# Patient Record
Sex: Female | Born: 1945
Health system: Southern US, Community
[De-identification: ages and names within clinical notes are randomized; demographics above are authoritative.]

## PROBLEM LIST (undated history)

## (undated) DIAGNOSIS — K922 Gastrointestinal hemorrhage, unspecified: Secondary | ICD-10-CM

## (undated) DIAGNOSIS — J40 Bronchitis, not specified as acute or chronic: Secondary | ICD-10-CM

## (undated) DIAGNOSIS — K4090 Unilateral inguinal hernia, without obstruction or gangrene, not specified as recurrent: Secondary | ICD-10-CM

## (undated) DIAGNOSIS — K635 Polyp of colon: Secondary | ICD-10-CM

## (undated) DIAGNOSIS — R911 Solitary pulmonary nodule: Secondary | ICD-10-CM

## (undated) DIAGNOSIS — E785 Hyperlipidemia, unspecified: Secondary | ICD-10-CM

## (undated) DIAGNOSIS — K219 Gastro-esophageal reflux disease without esophagitis: Secondary | ICD-10-CM

## (undated) DIAGNOSIS — Z720 Tobacco use: Secondary | ICD-10-CM

## (undated) DIAGNOSIS — I350 Nonrheumatic aortic (valve) stenosis: Secondary | ICD-10-CM

## (undated) DIAGNOSIS — K5730 Diverticulosis of large intestine without perforation or abscess without bleeding: Secondary | ICD-10-CM

## (undated) DIAGNOSIS — R42 Dizziness and giddiness: Secondary | ICD-10-CM

## (undated) DIAGNOSIS — R011 Cardiac murmur, unspecified: Secondary | ICD-10-CM

## (undated) DIAGNOSIS — K449 Diaphragmatic hernia without obstruction or gangrene: Secondary | ICD-10-CM

## (undated) DIAGNOSIS — D649 Anemia, unspecified: Secondary | ICD-10-CM

## (undated) DIAGNOSIS — J45909 Unspecified asthma, uncomplicated: Secondary | ICD-10-CM

## (undated) DIAGNOSIS — K208 Other esophagitis: Secondary | ICD-10-CM

## (undated) DIAGNOSIS — I10 Essential (primary) hypertension: Secondary | ICD-10-CM

## (undated) HISTORY — DX: Polyp of colon: K63.5

## (undated) HISTORY — PX: OTHER SURGICAL HISTORY: SHX169

## (undated) HISTORY — DX: Diverticulosis of large intestine without perforation or abscess without bleeding: K57.30

## (undated) HISTORY — DX: Anemia, unspecified: D64.9

## (undated) HISTORY — PX: CHOLECYSTECTOMY: SHX55

## (undated) HISTORY — DX: Other esophagitis: K20.8

## (undated) HISTORY — PX: COLONOSCOPY: SHX174

## (undated) HISTORY — DX: Tobacco use: Z72.0

## (undated) HISTORY — DX: Essential (primary) hypertension: I10

## (undated) HISTORY — DX: Hyperlipidemia, unspecified: E78.5

## (undated) HISTORY — DX: Solitary pulmonary nodule: R91.1

## (undated) HISTORY — DX: Gastro-esophageal reflux disease without esophagitis: K21.9

## (undated) HISTORY — DX: Gastrointestinal hemorrhage, unspecified: K92.2

## (undated) HISTORY — DX: Cardiac murmur, unspecified: R01.1

## (undated) HISTORY — DX: Diaphragmatic hernia without obstruction or gangrene: K44.9

## (undated) HISTORY — DX: Nonrheumatic aortic (valve) stenosis: I35.0

---

## 1999-01-14 DIAGNOSIS — K5730 Diverticulosis of large intestine without perforation or abscess without bleeding: Secondary | ICD-10-CM

## 1999-01-14 DIAGNOSIS — K635 Polyp of colon: Secondary | ICD-10-CM

## 1999-01-14 DIAGNOSIS — K208 Other esophagitis without bleeding: Secondary | ICD-10-CM

## 1999-01-14 DIAGNOSIS — K449 Diaphragmatic hernia without obstruction or gangrene: Secondary | ICD-10-CM

## 1999-01-14 HISTORY — DX: Diverticulosis of large intestine without perforation or abscess without bleeding: K57.30

## 1999-01-14 HISTORY — DX: Diaphragmatic hernia without obstruction or gangrene: K44.9

## 1999-01-14 HISTORY — DX: Other esophagitis without bleeding: K20.80

## 1999-01-14 HISTORY — DX: Polyp of colon: K63.5

## 1999-12-19 ENCOUNTER — Other Ambulatory Visit: Admission: RE | Admit: 1999-12-19 | Discharge: 1999-12-19 | Payer: Self-pay | Admitting: Internal Medicine

## 2002-09-13 ENCOUNTER — Encounter: Payer: Self-pay | Admitting: Emergency Medicine

## 2002-09-14 ENCOUNTER — Inpatient Hospital Stay (HOSPITAL_COMMUNITY): Admission: EM | Admit: 2002-09-14 | Discharge: 2002-09-17 | Payer: Self-pay | Admitting: Emergency Medicine

## 2002-09-14 ENCOUNTER — Encounter: Payer: Self-pay | Admitting: Orthopedic Surgery

## 2004-01-14 HISTORY — PX: ORIF HIP FRACTURE: SHX2125

## 2004-05-16 ENCOUNTER — Ambulatory Visit: Payer: Self-pay | Admitting: General Surgery

## 2004-06-10 ENCOUNTER — Ambulatory Visit: Payer: Self-pay | Admitting: General Surgery

## 2004-10-14 ENCOUNTER — Ambulatory Visit: Payer: Self-pay | Admitting: General Surgery

## 2008-03-16 ENCOUNTER — Ambulatory Visit: Payer: Self-pay

## 2012-03-16 ENCOUNTER — Telehealth: Payer: Self-pay | Admitting: Internal Medicine

## 2012-03-16 ENCOUNTER — Encounter: Payer: Self-pay | Admitting: *Deleted

## 2012-03-16 NOTE — Telephone Encounter (Signed)
Patient states she has been waking up during the night with vomiting and sometimes diarrhea for the last month. Occurs off and on. Denies GERD or pain. Last ECOL 12/19/1999. Scheduled with Doug Sou, PA on 03/18/12 at 9:15 AM.

## 2012-03-18 ENCOUNTER — Ambulatory Visit (INDEPENDENT_AMBULATORY_CARE_PROVIDER_SITE_OTHER): Payer: BC Managed Care – PPO | Admitting: Gastroenterology

## 2012-03-18 ENCOUNTER — Other Ambulatory Visit (INDEPENDENT_AMBULATORY_CARE_PROVIDER_SITE_OTHER): Payer: BC Managed Care – PPO

## 2012-03-18 ENCOUNTER — Encounter: Payer: Self-pay | Admitting: Gastroenterology

## 2012-03-18 ENCOUNTER — Encounter: Payer: Self-pay | Admitting: Internal Medicine

## 2012-03-18 VITALS — BP 160/80 | HR 70 | Ht 65.0 in | Wt 158.6 lb

## 2012-03-18 DIAGNOSIS — R112 Nausea with vomiting, unspecified: Secondary | ICD-10-CM | POA: Insufficient documentation

## 2012-03-18 DIAGNOSIS — Z1211 Encounter for screening for malignant neoplasm of colon: Secondary | ICD-10-CM | POA: Insufficient documentation

## 2012-03-18 LAB — COMPREHENSIVE METABOLIC PANEL
AST: 21 U/L (ref 0–37)
Albumin: 3.9 g/dL (ref 3.5–5.2)
Alkaline Phosphatase: 108 U/L (ref 39–117)
BUN: 16 mg/dL (ref 6–23)
Creatinine, Ser: 0.7 mg/dL (ref 0.4–1.2)
Glucose, Bld: 83 mg/dL (ref 70–99)
Potassium: 4.3 mEq/L (ref 3.5–5.1)

## 2012-03-18 LAB — CBC WITH DIFFERENTIAL/PLATELET
Basophils Relative: 0.6 % (ref 0.0–3.0)
Eosinophils Absolute: 0.1 10*3/uL (ref 0.0–0.7)
Eosinophils Relative: 2 % (ref 0.0–5.0)
HCT: 40.3 % (ref 36.0–46.0)
Hemoglobin: 13.9 g/dL (ref 12.0–15.0)
Lymphs Abs: 2.1 10*3/uL (ref 0.7–4.0)
MCHC: 34.6 g/dL (ref 30.0–36.0)
MCV: 90.4 fl (ref 78.0–100.0)
Monocytes Absolute: 0.5 10*3/uL (ref 0.1–1.0)
Neutro Abs: 4.2 10*3/uL (ref 1.4–7.7)
Neutrophils Relative %: 60 % (ref 43.0–77.0)
RBC: 4.45 Mil/uL (ref 3.87–5.11)
WBC: 7 10*3/uL (ref 4.5–10.5)

## 2012-03-18 LAB — IGA: IgA: 347 mg/dL (ref 68–378)

## 2012-03-18 MED ORDER — MOVIPREP 100 G PO SOLR: 1.0000 | Freq: Once | ORAL | Status: DC

## 2012-03-18 NOTE — Progress Notes (Signed)
Reviewed, may use reglan 5 mg prn nausea. We can wait pending results of EGD/colon. Suspect choleretic diarrhea.

## 2012-03-18 NOTE — Progress Notes (Signed)
03/18/2012 Mandy Delacruz [id number] 1945/12/22   HISTORY OF PRESENT ILLNESS:  This is a pleasant 67 year old female who is a patient of Dr. Regino Schultze.  She presents to our office today to schedule a colonoscopy and to discuss recent vomiting episodes that she has been experiencing.  Her last colonoscopy was in 2001 at which time she had hyperplastic polyps removed and had diverticulosis as well; recommended repeat was in 7 years.  She says that since she had her gallbladder removed she does get intermittent diarrhea; usually cycles between constipation and diarrhea.  Does not have a BM for a few days, then she begins having diarrhea.  Denies dark or bloody stools.    She admits to 5 episodes of vomiting spells within the past month.  Says that these all occur in the middle of the night; she will vomit two or three times, then is fine.  Vomits undigested food.  Says that she does eat late at night on occasion and she does recall that she ate a heavy dinner shortly before going to bed on at least two of these occasions.  Denies decreased appetite, weight loss, and early satiety.  Has been taking ibuprofen once a day since 2006.  Taking omeprazole (or some type of PPI) daily since her EGD in 2001.  EGD at that time showed a 3 cm hiatal hernia and esophagitis.  Past Medical History  Diagnosis Date  . Diverticulosis of colon (without mention of hemorrhage) 2001  . Other esophagitis 2001  . Hiatal hernia 2001  . Colon polyps 2001   Past Surgical History  Procedure Laterality Date  . Cholecystectomy    . Inguinal hernia repair      right  . Orif hip fracture  2006    3 pins, right  . Breast biospy      left, benign    reports that she has been smoking Cigarettes.  She has a 45 pack-year smoking history. She has never used smokeless tobacco. She reports that  drinks alcohol. She reports that she does not use illicit drugs. family history includes Celiac disease in an unspecified family member.   There is no history of Colon cancer. Allergies  Allergen Reactions  . Codeine Nausea Only      Outpatient Encounter Prescriptions as of 03/18/2012  Medication Sig Dispense Refill  . ibuprofen (ADVIL) 200 MG tablet Take 200 mg by mouth daily.      Marland Kitchen omeprazole (PRILOSEC OTC) 20 MG tablet Take 20 mg by mouth daily.       No facility-administered encounter medications on file as of 03/18/2012.     REVIEW OF SYSTEMS  : All other systems reviewed and negative except where noted in the History of Present Illness.   PHYSICAL EXAM: BP 180/100  Pulse 70  Ht 5\' 5"  (1.651 m)  Wt 158 lb 9.6 oz (71.94 kg)  BMI 26.39 kg/m2 General: Well developed white female in no acute distress Head: Normocephalic and atraumatic Eyes:  sclerae anicteric, conjunctiva pink. Ears: Normal auditory acuity Lungs: Clear throughout to auscultation Heart: Regular rate and rhythm Abdomen: Soft, non-tender, non-distended. No masses or hepatomegaly noted. Normal bowel sounds. Rectal: Deferred.  Will be done at the time of colonoscopy. Musculoskeletal: Symmetrical with no gross deformities  Skin: No lesions on visible extremities Extremities: No edema  Neurological: Alert oriented x 4, grossly nonfocal Psychological:  Alert and cooperative. Normal mood and affect  ASSESSMENT AND PLAN: -Screening colonoscopy:  Last colonoscopy 2001 with hyperplastic  colonic polyps and diverticulosis.  Has some alternating constipation and diarrhea. -Intermittent vomiting:  With vomiting of undigested food.  *Will schedule EGD and colonoscopy for further evaluation.  *CBC, CMP, TSH, celiac titers. *I offered her a prescription for zofran, but she declined at this time. *She can try fiber supplement daily; recommended citrucel.

## 2012-03-18 NOTE — Patient Instructions (Addendum)
You have been scheduled for an endoscopy and colonoscopy with propofol with Dr. Juanda Chance. Please follow the written instructions given to you at your visit today. Please pick up your prep at the pharmacy within the next 1-3 days. If you use inhalers (even only as needed), please bring them with you on the day of your procedure.  Your physician has requested that you go to the basement for the following lab work before leaving today: CBC, CMET, TSH, IGA and Celiac Panel

## 2012-03-22 LAB — CELIAC PANEL 10
Endomysial Screen: NEGATIVE
Gliadin IgA: 7.7 U/mL (ref <20)
IgA: 325 mg/dL (ref 69–380)

## 2012-04-13 ENCOUNTER — Encounter: Payer: Self-pay | Admitting: Internal Medicine

## 2012-04-13 ENCOUNTER — Ambulatory Visit (AMBULATORY_SURGERY_CENTER): Payer: BC Managed Care – PPO | Admitting: Internal Medicine

## 2012-04-13 VITALS — BP 184/89 | HR 67 | Temp 98.1°F | Resp 27 | Ht 65.0 in | Wt 158.0 lb

## 2012-04-13 DIAGNOSIS — R112 Nausea with vomiting, unspecified: Secondary | ICD-10-CM

## 2012-04-13 DIAGNOSIS — D126 Benign neoplasm of colon, unspecified: Secondary | ICD-10-CM

## 2012-04-13 DIAGNOSIS — Z1211 Encounter for screening for malignant neoplasm of colon: Secondary | ICD-10-CM

## 2012-04-13 MED ORDER — SODIUM CHLORIDE 0.9 % IV SOLN: 500.0000 mL | INTRAVENOUS | Status: DC

## 2012-04-13 NOTE — Progress Notes (Addendum)
Patient did not have preoperative order for IV antibiotic SSI prophylaxis. (G8918)  Patient did not experience any of the following events: a burn prior to discharge; a fall within the facility; wrong site/side/patient/procedure/implant event; or a hospital transfer or hospital admission upon discharge from the facility. (G8907)  

## 2012-04-13 NOTE — Op Note (Signed)
[facility] [address] [place], [address]   COLONOSCOPY PROCEDURE REPORT  PATIENT: Mandy, Delacruz  MR#: [id number] BIRTHDATE: 1945-03-20 , 66  yrs. old GENDER: Female ENDOSCOPIST: Hart Carwin, MD REFERRED BY:  Loma Sender, M.D. PROCEDURE DATE:  04/13/2012 PROCEDURE:   Colonoscopy with cold biopsy polypectomy ASA CLASS:   Class II INDICATIONS:Average risk patient for colon cancer and last colonoscopy in 2001 was normal. MEDICATIONS: MAC sedation, administered by CRNA and propofol (Diprivan) 300mg  IV  DESCRIPTION OF PROCEDURE:   After the risks and benefits and of the procedure were explained, informed consent was obtained.  A digital rectal exam revealed no abnormalities of the rectum.    The LB [id number]  endoscope was introduced through the anus and advanced to the cecum, which was identified by both the appendix and ileocecal valve .  The quality of the prep was good, using MoviPrep .  The instrument was then slowly withdrawn as the colon was fully examined.     COLON FINDINGS: There was moderate diverticulosis noted in the sigmoid colon with associated tortuosity, muscular hypertrophy and luminal narrowing.     Retroflexed views revealed no abnormalities. The scope was then withdrawn from the patient and the procedure completed.Diminutive polyp at 40 cm removed with cold biopsies  COMPLICATIONS: There were no complications. ENDOSCOPIC IMPRESSION: There was moderate diverticulosis noted in the sigmoid colon Diminutive polyp removed from 40 cm with cold biopsies  RECOMMENDATIONS: 1.  Await pathology results 2.  High fiber diet   REPEAT EXAM: In 10 year(s)  for Colonoscopy.  cc:  _______________________________ eSignedHart Carwin, MD 04/13/2012 3:19 PM     PATIENT NAME:  Mandy, Delacruz MR#: [id number]

## 2012-04-13 NOTE — Patient Instructions (Addendum)
YOU HAD AN ENDOSCOPIC PROCEDURE TODAY AT THE [facility]: Refer to the procedure report that was given to you for any specific questions about what was found during the examination.  If the procedure report does not answer your questions, please call your gastroenterologist to clarify.  If you requested that your care partner not be given the details of your procedure findings, then the procedure report has been included in a sealed envelope for you to review at your convenience later.  YOU SHOULD EXPECT: Some feelings of bloating in the abdomen. Passage of more gas than usual.  Walking can help get rid of the air that was put into your GI tract during the procedure and reduce the bloating. If you had a lower endoscopy (such as a colonoscopy or flexible sigmoidoscopy) you may notice spotting of blood in your stool or on the toilet paper. If you underwent a bowel prep for your procedure, then you may not have a normal bowel movement for a few days.  DIET: Your first meal following the procedure should be a light meal and then it is ok to progress to your normal diet.  A half-sandwich or bowl of soup is an example of a good first meal.  Heavy or fried foods are harder to digest and may make you feel nauseous or bloated.  Likewise meals heavy in dairy and vegetables can cause extra gas to form and this can also increase the bloating.  Drink plenty of fluids but you should avoid alcoholic beverages for 24 hours.  ACTIVITY: Your care partner should take you home directly after the procedure.  You should plan to take it easy, moving slowly for the rest of the day.  You can resume normal activity the day after the procedure however you should NOT DRIVE or use heavy machinery for 24 hours (because of the sedation medicines used during the test).    SYMPTOMS TO REPORT IMMEDIATELY: A gastroenterologist can be reached at any hour.  During normal business hours, 8:30 AM to 5:00 PM Monday through Friday,  call [phone].  After hours and on weekends, please call the GI answering service at [phone] who will take a message and have the physician on call contact you.   Following lower endoscopy (colonoscopy or flexible sigmoidoscopy):  Excessive amounts of blood in the stool  Significant tenderness or worsening of abdominal pains  Swelling of the abdomen that is new, acute  Fever of 100F or higher  Following upper endoscopy (EGD)  Vomiting of blood or coffee ground material  New chest pain or pain under the shoulder blades  Painful or persistently difficult swallowing  New shortness of breath  Fever of 100F or higher  Black, tarry-looking stools  FOLLOW UP: If any biopsies were taken you will be contacted by phone or by letter within the next 1-3 weeks.  Call your gastroenterologist if you have not heard about the biopsies in 3 weeks.  Our staff will call the home number listed on your records the next business day following your procedure to check on you and address any questions or concerns that you may have at that time regarding the information given to you following your procedure. This is a courtesy call and so if there is no answer at the home number and we have not heard from you through the emergency physician on call, we will assume that you have returned to your regular daily activities without incident.  SIGNATURES/CONFIDENTIALITY: You and/or your care   partner have signed paperwork which will be entered into your electronic medical record.  These signatures attest to the fact that that the information above on your After Visit Summary has been reviewed and is understood.  Full responsibility of the confidentiality of this discharge information lies with you and/or your care-partner.  

## 2012-04-13 NOTE — Op Note (Signed)
[facility] [address] [place], [address]   ENDOSCOPY PROCEDURE REPORT  PATIENT: Mandy Delacruz, Mandy Delacruz  MR#: [id number] BIRTHDATE: 02-05-1945 , 66  yrs. old GENDER: Female ENDOSCOPIST: Hart Carwin, MD REFERRED BY:  Loma Sender, M.D. PROCEDURE DATE:  04/13/2012 PROCEDURE:  EGD, diagnostic ASA CLASS:     Class II INDICATIONS:  Vomiting.   Nausea.   EGD 2001 for N&V- 3 cm hiatal hernia, positive family hx of celiac disease. MEDICATIONS: MAC sedation, administered by CRNA and propofol (Diprivan) 200mg  IV TOPICAL ANESTHETIC: none  DESCRIPTION OF PROCEDURE: After the risks benefits and alternatives of the procedure were thoroughly explained, informed consent was obtained.  The LB GIF-H180 [id number] endoscope was introduced through the mouth and advanced to the second portion of the duodenum. Without limitations.  The instrument was slowly withdrawn as the mucosa was fully examined.        ESOPHAGUS: A 1 cm hiatal hernia was noted.  STOMACH: The mucosa of the stomach appeared normal.  DUODENUM: The duodenal mucosa showed no abnormalities.  Retroflexed views revealed no abnormalities.     The scope was then withdrawn from the patient and the procedure completed.  COMPLICATIONS: There were no complications. ENDOSCOPIC IMPRESSION: 1.   1 cm hiatal hernia 2.   The mucosa of the stomach appeared normal 3.   The duodenal mucosa showed no abnormalities , nothing to account for patients symptoms  RECOMMENDATIONS: 1.  Anti-reflux regimen to be follow 2.  Continue PPI 3. consider GES 4. Abdominal ultraound was canceled due to pt having MRI's  REPEAT EXAM: no recall  eSigned:  Hart Carwin, MD 04/13/2012 3:12 PM   CC:

## 2012-04-13 NOTE — Progress Notes (Addendum)
Pt's blood pressure 172/90.  Pt states she sometimes gets "white coat syndrome"  CRNA made aware

## 2012-04-14 ENCOUNTER — Telehealth: Payer: Self-pay

## 2012-04-14 NOTE — Telephone Encounter (Signed)
  Follow up Call-  Call back number 04/13/2012  Post procedure Call Back phone  # [phone]  Permission to leave phone message Yes     Patient questions:  Do you have a fever, pain , or abdominal swelling? no Pain Score  0 *  Have you tolerated food without any problems? yes  Have you been able to return to your normal activities? yes  Do you have any questions about your discharge instructions: Diet   no Medications  no Follow up visit  no  Do you have questions or concerns about your Care? no  Actions: * If pain score is 4 or above: No action needed, pain <4.

## 2012-04-19 ENCOUNTER — Encounter: Payer: Self-pay | Admitting: Internal Medicine

## 2012-08-18 ENCOUNTER — Other Ambulatory Visit: Payer: Self-pay

## 2012-11-18 ENCOUNTER — Other Ambulatory Visit: Payer: Self-pay

## 2013-03-28 ENCOUNTER — Encounter (INDEPENDENT_AMBULATORY_CARE_PROVIDER_SITE_OTHER): Payer: Self-pay | Admitting: General Surgery

## 2013-03-31 ENCOUNTER — Ambulatory Visit (INDEPENDENT_AMBULATORY_CARE_PROVIDER_SITE_OTHER): Payer: BC Managed Care – PPO | Admitting: General Surgery

## 2013-03-31 ENCOUNTER — Encounter (INDEPENDENT_AMBULATORY_CARE_PROVIDER_SITE_OTHER): Payer: Self-pay | Admitting: General Surgery

## 2013-03-31 VITALS — BP 170/98 | HR 88 | Temp 97.5°F | Ht 65.0 in | Wt 154.8 lb

## 2013-03-31 DIAGNOSIS — K4090 Unilateral inguinal hernia, without obstruction or gangrene, not specified as recurrent: Secondary | ICD-10-CM | POA: Insufficient documentation

## 2013-03-31 NOTE — Patient Instructions (Signed)
Please follow up with your PCP regarding your blood pressure Try to work on stopping smoking Please call when you would like to proceed with hernia surgery  Hernia Repair with Laparoscope A hernia occurs when an internal organ pushes out through a weak spot in the belly (abdominal) wall muscles. Hernias most commonly occur in the groin and around the navel. Hernias can also occur through a cut by the surgeon (incision) after an abdominal operation. A hernia may be caused by:  Lifting heavy objects.  Prolonged coughing.  Straining to move your bowels. Hernias can often be pushed back into place (reduced). Most hernias tend to get worse over time. Problems occur when abdominal contents get stuck in the opening and the blood supply is blocked or impaired (incarcerated hernia). Because of these risks, you require surgery to repair the hernia. Your hernia will be repaired using a laparoscope. Laparoscopic surgery is a type of minimally invasive surgery. It does not involve making a typical surgical cut (incision) in the skin. A laparoscope is a telescope-like rod and lens system. It is usually connected to a video camera and a light source so your caregiver can clearly see the operative area. The instruments are inserted through  to  inch (5 mm or 10 mm) openings in the skin at specific locations. A working and viewing space is created by blowing a small amount of carbon dioxide gas into the abdominal cavity. The abdomen is essentially blown up like a balloon (insufflated). This elevates the abdominal wall above the internal organs like a dome. The carbon dioxide gas is common to the human body and can be absorbed by tissue and removed by the respiratory system. Once the repair is completed, the small incisions will be closed with either stitches (sutures) or staples (just like a paper stapler only this staple holds the skin together). LET YOUR CAREGIVERS KNOW ABOUT:  Allergies.  Medications taken  including herbs, eye drops, over the counter medications, and creams.  Use of steroids (by mouth or creams).  Previous problems with anesthetics or Novocaine.  Possibility of pregnancy, if this applies.  History of blood clots (thrombophlebitis).  History of bleeding or blood problems.  Previous surgery.  Other health problems. BEFORE THE PROCEDURE  Laparoscopy can be done either in a hospital or out-patient clinic. You may be given a mild sedative to help you relax before the procedure. Once in the operating room, you will be given a general anesthesia to make you sleep (unless you and your caregiver choose a different anesthetic).  AFTER THE PROCEDURE  After the procedure you will be watched in a recovery area. Depending on what type of hernia was repaired, you might be admitted to the hospital or you might go home the same day. With this procedure you may have less pain and scarring. This usually results in a quicker recovery and less risk of infection. HOME CARE INSTRUCTIONS   Bed rest is not required. You may continue your normal activities but avoid heavy lifting (more than 10 pounds) or straining.  Cough gently. If you are a smoker it is best to stop, as even the best hernia repair can break down with the continual strain of coughing.  Avoid driving until given the OK by your surgeon.  There are no dietary restrictions unless given otherwise.  TAKE ALL MEDICATIONS AS DIRECTED.  Only take over-the-counter or prescription medicines for pain, discomfort, or fever as directed by your caregiver. SEEK MEDICAL CARE IF:   There is  increasing abdominal pain or pain in your incisions.  There is more bleeding from incisions, other than minimal spotting.  You feel light headed or faint.  You develop an unexplained fever, chills, and/or an oral temperature above 102 F (38.9 C).  You have redness, swelling, or increasing pain in the wound.  Pus coming from wound.  A foul  smell coming from the wound or dressings. SEEK IMMEDIATE MEDICAL CARE IF:   You develop a rash.  You have difficulty breathing.  You have any allergic problems. MAKE SURE YOU:   Understand these instructions.  Will watch your condition.  Will get help right away if you are not doing well or get worse. Document Released: 12/30/2004 Document Revised: 03/24/2011 Document Reviewed: 11/29/2008 [facility] Patient Information 2014 [place], [place].

## 2013-03-31 NOTE — Progress Notes (Signed)
Patient ID: Mandy Delacruz, female   DOB: January 01, 1946, 68 y.o.   MRN: [id number]  Chief Complaint  Patient presents with  . New Evaluation    eval left side abd pain poss abd wall hernia    HPI Mandy Delacruz is a 68 y.o. female.   HPI 68 year old Caucasian female referred by Dr. Kelton Pillar for evaluation of a left inguinal hernia. The patient states that she noticed a bulge in her left groin a short time ago. She states that the bulge is always there however a sizable change throughout the day. It is more prominent toward the end of the day after she has been walking around. It does cause some minor discomfort if she bends over or she is walking for a long time. She denies any sharp, shooting or stinging pain. She denies any nausea, vomiting, diarrhea. She generally has about 4 bowel movements a week. The bulge Reminded her about a contralateral bulge about 15-20 years ago when she was diagnosed with a right inguinal hernia for which she underwent repair. She is a lifelong smoker. She is down to about half a pack a day. Past Medical History  Diagnosis Date  . Diverticulosis of colon (without mention of hemorrhage) 2001  . Other esophagitis 2001  . Hiatal hernia 2001  . Colon polyps 2001  . GERD (gastroesophageal reflux disease)   . Hypertension     Past Surgical History  Procedure Laterality Date  . Cholecystectomy    . Inguinal hernia repair      right  . Orif hip fracture  2006    3 pins, right  . Breast biospy      left, benign  . Colonoscopy      Family History  Problem Relation Age of Onset  . Celiac disease      neice, Sister's daughter  . Colon cancer Neg Hx   . Esophageal cancer Neg Hx   . Rectal cancer Neg Hx   . Stomach cancer Neg Hx   . COPD Mother   . Hypertension Father     Social History History  Substance Use Topics  . Smoking status: Current Every Day Smoker -- 1.00 packs/day for 45 years    Types: Cigarettes  . Smokeless tobacco: Never Used   Comment: Tobacco info given 03/18/12  . Alcohol Use: 0.0 oz/week     Comment: social    Allergies  Allergen Reactions  . Codeine Nausea Only    Current Outpatient Prescriptions  Medication Sig Dispense Refill  . ibuprofen (ADVIL) 200 MG tablet Take 200 mg by mouth daily.      Marland Kitchen lisinopril (PRINIVIL,ZESTRIL) 20 MG tablet Take 20 mg by mouth daily.      Marland Kitchen omeprazole (PRILOSEC OTC) 20 MG tablet Take 20 mg by mouth daily.       No current facility-administered medications for this visit.    Review of Systems Review of Systems  Constitutional: Negative for fever, activity change, appetite change and unexpected weight change.  HENT: Negative for nosebleeds and trouble swallowing.   Eyes: Negative for photophobia and visual disturbance.  Respiratory: Negative for chest tightness and shortness of breath.   Cardiovascular: Negative for chest pain and leg swelling.       Denies CP, SOB, orthopnea, PND, DOE  Gastrointestinal:       GERD  Genitourinary: Negative for dysuria and difficulty urinating.  Musculoskeletal: Negative for arthralgias.  Skin: Negative for pallor and rash.  Neurological: Negative for dizziness, seizures,  facial asymmetry and numbness.       Denies TIA and amaurosis fugax   Hematological: Negative for adenopathy. Does not bruise/bleed easily.  Psychiatric/Behavioral: Negative for behavioral problems and agitation.    Blood pressure 170/98, pulse 88, temperature 97.5 F (36.4 C), temperature source Temporal, height 5\' 5"  (1.651 m), weight 154 lb 12.8 oz (70.217 kg).  Physical Exam Physical Exam  Vitals reviewed. Constitutional: She is oriented to person, place, and time. She appears well-developed and well-nourished. No distress.  HENT:  Head: Normocephalic and atraumatic.  Right Ear: External ear normal.  Left Ear: External ear normal.  Eyes: Conjunctivae are normal. No scleral icterus.  Neck: Normal range of motion. Neck supple. No tracheal deviation  present. No thyromegaly present.  Cardiovascular: Normal rate and normal heart sounds.   Pulmonary/Chest: Effort normal and breath sounds normal. No stridor. No respiratory distress. She has no wheezes.  Abdominal: Soft. She exhibits no distension. There is no tenderness. There is no rebound and no guarding. A hernia is present. Hernia confirmed positive in the left inguinal area. Hernia confirmed negative in the ventral area and confirmed negative in the right inguinal area.    Left inguinal hernia, reducible, nontender, only noticeable with standing and with Valsalva maneuver.  Musculoskeletal: She exhibits no edema and no tenderness.  Lymphadenopathy:    She has no cervical adenopathy.  Neurological: She is alert and oriented to person, place, and time. She exhibits normal muscle tone.  Skin: Skin is warm and dry. No rash noted. She is not diaphoretic. No erythema.  Psychiatric: She has a normal mood and affect. Her behavior is normal. Judgment and thought content normal.    Data Reviewed egd 02/2012 Dr Delene Ruffini office note 3/12 and labs  Assessment    Left inguinal hernia Tobacco use/dependence  HTN     Plan    We discussed the etiology of inguinal hernias. We discussed the signs & symptoms of incarceration & strangulation.  We discussed non-operative and operative management.   I described LAPAROSCOPIC LEFT INGUINAL HERNIA REPAIR WITH MESH in detail.  The patient was given educational material. We discussed the risks and benefits including but not limited to bleeding, infection, chronic inguinal pain, nerve entrapment, hernia recurrence, mesh complications, hematoma formation, urinary retention, injury to the testicles or the ovaries, numbness in the groin, blood clots, injury to the surrounding structures, and anesthesia risk. We also discussed the typical post operative recovery course, including no heavy lifting for 4-6 weeks. I explained that the likelihood of improvement of  their symptoms is good. We also briefly discussed open repair.   She is leaning toward repair; however, she would like to postpone it for a little while. I do not think that is unreasonable. I did asked her that her blood pressure when he be better controlled prior to repair. She has a followup scheduled with Dr. Laurann Montana for early April. She was instructed to contact our office when she would like to proceed with hernia repair. Otherwise followup as needed   Leighton Ruff. Redmond Pulling, MD, FACS General, Bariatric, & Minimally Invasive Surgery [facility], [place]        [facility] M 03/31/2013, 3:47 PM

## 2013-05-01 ENCOUNTER — Ambulatory Visit (INDEPENDENT_AMBULATORY_CARE_PROVIDER_SITE_OTHER): Payer: BC Managed Care – PPO | Admitting: Emergency Medicine

## 2013-05-01 ENCOUNTER — Ambulatory Visit: Payer: BC Managed Care – PPO

## 2013-05-01 VITALS — BP 110/60 | HR 78 | Temp 97.6°F | Resp 16 | Ht 65.0 in | Wt 146.0 lb

## 2013-05-01 DIAGNOSIS — R9389 Abnormal findings on diagnostic imaging of other specified body structures: Secondary | ICD-10-CM

## 2013-05-01 DIAGNOSIS — J029 Acute pharyngitis, unspecified: Secondary | ICD-10-CM

## 2013-05-01 DIAGNOSIS — I10 Essential (primary) hypertension: Secondary | ICD-10-CM

## 2013-05-01 DIAGNOSIS — R918 Other nonspecific abnormal finding of lung field: Secondary | ICD-10-CM

## 2013-05-01 DIAGNOSIS — J040 Acute laryngitis: Secondary | ICD-10-CM

## 2013-05-01 LAB — POCT CBC
GRANULOCYTE PERCENT: 74.4 % (ref 37–80)
HCT, POC: 39.6 % (ref 37.7–47.9)
HEMOGLOBIN: 12.7 g/dL (ref 12.2–16.2)
Lymph, poc: 2.2 (ref 0.6–3.4)
MCH, POC: 30.2 pg (ref 27–31.2)
MCHC: 32.1 g/dL (ref 31.8–35.4)
MCV: 94.2 fL (ref 80–97)
MID (CBC): 0.7 (ref 0–0.9)
MPV: 10.1 fL (ref 0–99.8)
PLATELET COUNT, POC: 228 10*3/uL (ref 142–424)
POC Granulocyte: 8.3 — AB (ref 2–6.9)
POC LYMPH PERCENT: 19.2 %L (ref 10–50)
POC MID %: 6.4 %M (ref 0–12)
RBC: 4.2 M/uL (ref 4.04–5.48)
RDW, POC: 13.6 %
WBC: 11.2 10*3/uL — AB (ref 4.6–10.2)

## 2013-05-01 LAB — POCT RAPID STREP A (OFFICE): Rapid Strep A Screen: NEGATIVE

## 2013-05-01 LAB — BASIC METABOLIC PANEL
BUN: 31 mg/dL — AB (ref 6–23)
CHLORIDE: 98 meq/L (ref 96–112)
CO2: 23 mEq/L (ref 19–32)
Calcium: 9.5 mg/dL (ref 8.4–10.5)
Creat: 1.15 mg/dL — ABNORMAL HIGH (ref 0.50–1.10)
Glucose, Bld: 87 mg/dL (ref 70–99)
POTASSIUM: 4.5 meq/L (ref 3.5–5.3)
SODIUM: 134 meq/L — AB (ref 135–145)

## 2013-05-01 MED ORDER — CEFDINIR 300 MG PO CAPS: 600.0000 mg | ORAL_CAPSULE | Freq: Every day | ORAL | Status: DC

## 2013-05-01 MED ORDER — AMLODIPINE BESYLATE 2.5 MG PO TABS: 2.5000 mg | ORAL_TABLET | Freq: Every day | ORAL | Status: DC

## 2013-05-01 MED ORDER — BENZONATATE 100 MG PO CAPS: 100.0000 mg | ORAL_CAPSULE | Freq: Three times a day (TID) | ORAL | Status: DC | PRN

## 2013-05-01 NOTE — Patient Instructions (Signed)

## 2013-05-01 NOTE — Progress Notes (Addendum)
   Subjective:  This chart was scribed for Mandy Russian, MD by Ludger Nutting, ED Scribe. This patient was seen in room 10 and the patient's care was started 11:20 AM.    Patient ID: Mandy Delacruz, female    DOB: 10-16-1945, 68 y.o.   MRN: [id number]  HPI HPI Comments: KYNZLI REASE is a 68 y.o. female who presents to [facility] complaining of 2-3 days of gradual onset, gradually worsening, constant nasal congestion with associated sore throat and hoarse voice. She describes the sore throat as pain and tightness and states it is worse with coughing and swallowing. She took a course of azithromycin without relief. She was started on lisinopril about 1.5 months ago. She is a current 1ppd smoker but has not smoked in the past 4 days.    Review of Systems  Constitutional: Negative for fever.  HENT: Positive for congestion, sore throat and voice change (hoarse). Negative for trouble swallowing.        Objective:   Physical Exam  Vitals reviewed.   CONSTITUTIONAL: Well developed/well nourished HEAD: Normocephalic/atraumatic EYES: EOMI/PERRL ENMT: Mucous membranes moist, posterior oropharynx is mildly erythematous without exudate. Voice is noted to be hoarse.  NECK: supple no meningeal signs, no cervical adenopathy  SPINE:entire spine nontender CV: S1/S2 noted, no murmurs/rubs/gallops noted LUNGS: Lungs are clear to auscultation bilaterally, no apparent distress ABDOMEN: soft, nontender, no rebound or guarding GU:no cva tenderness NEURO: Pt is awake/alert, moves all extremitiesx4 EXTREMITIES: pulses normal, full ROM SKIN: warm, color normal PSYCH: no abnormalities of mood noted  Results for orders placed in visit on 05/01/13  POCT CBC      Result Value Ref Range   WBC 11.2 (*) 4.6 - 10.2 K/uL   Lymph, poc 2.2  0.6 - 3.4   POC LYMPH PERCENT 19.2  10 - 50 %L   MID (cbc) 0.7  0 - 0.9   POC MID % 6.4  0 - 12 %M   POC Granulocyte 8.3 (*) 2 - 6.9   Granulocyte percent 74.4  37 - 80 %G   RBC  4.20  4.04 - 5.48 M/uL   Hemoglobin 12.7  12.2 - 16.2 g/dL   HCT, POC 39.6  37.7 - 47.9 %   MCV 94.2  80 - 97 fL   MCH, POC 30.2  27 - 31.2 pg   MCHC 32.1  31.8 - 35.4 g/dL   RDW, POC 13.6     Platelet Count, POC 228  142 - 424 K/uL   MPV 10.1  0 - 99.8 fL  POCT RAPID STREP A (OFFICE)      Result Value Ref Range   Rapid Strep A Screen Negative  Negative  [facility] reading (PRIMARY) by  Dr Everlene Farrier there's a question small infiltrate in the left base. There is an definite infiltrative process in the left base questionable spiculated area.     Assessment & Plan:  Patient will stop her ACE inhibitor.. Patient is a high risk for lung cancer with 45 pack years of smoking. She will be started on Omnicef for a community-acquired pneumonia. Her chest x-ray is abnormal need to rule out lung CA. She will be on amlodipine 2.5 mg for blood pressure. I personally performed the services described in this documentation, which was scribed in my presence. The recorded information has been reviewed and is accurate.

## 2013-05-03 ENCOUNTER — Ambulatory Visit
Admission: RE | Admit: 2013-05-03 | Discharge: 2013-05-03 | Disposition: A | Discharge status: Home / Self Care | Payer: BC Managed Care – PPO | Source: Ambulatory Visit | Attending: Emergency Medicine | Admitting: Emergency Medicine

## 2013-05-03 DIAGNOSIS — R9389 Abnormal findings on diagnostic imaging of other specified body structures: Secondary | ICD-10-CM

## 2013-05-03 LAB — CULTURE, GROUP A STREP: ORGANISM ID, BACTERIA: NORMAL

## 2013-05-03 MED ORDER — IOHEXOL 300 MG/ML  SOLN
75.0000 mL | Freq: Once | INTRAMUSCULAR | Status: AC | PRN
  Administered 2013-05-03: 75 mL via INTRAVENOUS

## 2013-05-03 NOTE — Addendum Note (Signed)
Addended by: Kyra Manges on: 05/03/2013 02:09 PM   Modules accepted: Orders

## 2013-05-04 ENCOUNTER — Telehealth: Payer: Self-pay

## 2013-05-04 NOTE — Telephone Encounter (Signed)
Advised patient that it would be wise not travel if still feeling ill.  She states she is feeling better today but is still having symptoms.   Requests that patient be called about CT on her cell.  [phone]

## 2013-05-04 NOTE — Telephone Encounter (Signed)
Pt requesting CT results. The report is available in chart. Please advise and I will rtn call to pt.

## 2013-05-04 NOTE — Telephone Encounter (Signed)
Patient called states she is a little better today.  She is suppose to leave this Saturday to go to [place] and wants to know if they should cancel their trip?

## 2013-05-04 NOTE — Telephone Encounter (Signed)
Pt.notified

## 2013-05-04 NOTE — Telephone Encounter (Signed)
I think it would be wise to cancel their trip. I do not like patients to travel if they are ill

## 2013-05-04 NOTE — Telephone Encounter (Signed)
There is airspace disease involving 2 lobes of the lung. The radiologist has recommended a repeat CT scan in 2 months to document clearing of her chest x-ray.

## 2013-05-05 ENCOUNTER — Other Ambulatory Visit: Payer: Self-pay | Admitting: Emergency Medicine

## 2013-05-05 ENCOUNTER — Telehealth: Payer: Self-pay | Admitting: Radiology

## 2013-05-05 ENCOUNTER — Other Ambulatory Visit: Payer: BC Managed Care – PPO

## 2013-05-05 DIAGNOSIS — J189 Pneumonia, unspecified organism: Secondary | ICD-10-CM

## 2013-05-05 NOTE — Telephone Encounter (Signed)
Schedule CT scan in 6-8 weeks / radiologist

## 2013-05-23 ENCOUNTER — Other Ambulatory Visit: Payer: Self-pay | Admitting: Family Medicine

## 2013-05-23 DIAGNOSIS — J189 Pneumonia, unspecified organism: Secondary | ICD-10-CM

## 2013-07-01 ENCOUNTER — Other Ambulatory Visit: Payer: BC Managed Care – PPO

## 2013-07-13 ENCOUNTER — Ambulatory Visit
Admission: RE | Admit: 2013-07-13 | Discharge: 2013-07-13 | Disposition: A | Discharge status: Home / Self Care | Payer: BC Managed Care – PPO | Source: Ambulatory Visit | Attending: Emergency Medicine | Admitting: Emergency Medicine

## 2013-07-13 ENCOUNTER — Other Ambulatory Visit: Payer: BC Managed Care – PPO

## 2013-07-13 ENCOUNTER — Other Ambulatory Visit: Payer: Self-pay | Admitting: Emergency Medicine

## 2013-07-13 DIAGNOSIS — R9389 Abnormal findings on diagnostic imaging of other specified body structures: Secondary | ICD-10-CM

## 2013-07-28 ENCOUNTER — Other Ambulatory Visit: Payer: Self-pay | Admitting: Family Medicine

## 2013-07-28 ENCOUNTER — Other Ambulatory Visit (HOSPITAL_COMMUNITY)
Admission: RE | Admit: 2013-07-28 | Discharge: 2013-07-28 | Disposition: A | Discharge status: Home / Self Care | Payer: BC Managed Care – PPO | Source: Ambulatory Visit | Attending: Family Medicine | Admitting: Family Medicine

## 2013-07-28 DIAGNOSIS — Z124 Encounter for screening for malignant neoplasm of cervix: Secondary | ICD-10-CM | POA: Insufficient documentation

## 2013-08-01 LAB — CYTOLOGY - PAP

## 2013-09-09 ENCOUNTER — Encounter (INDEPENDENT_AMBULATORY_CARE_PROVIDER_SITE_OTHER): Payer: Self-pay | Admitting: General Surgery

## 2013-09-09 ENCOUNTER — Ambulatory Visit (INDEPENDENT_AMBULATORY_CARE_PROVIDER_SITE_OTHER): Payer: BC Managed Care – PPO | Admitting: General Surgery

## 2013-09-09 VITALS — BP 110/70 | HR 62 | Temp 98.0°F | Resp 18 | Ht 65.0 in | Wt 149.8 lb

## 2013-09-09 DIAGNOSIS — K4090 Unilateral inguinal hernia, without obstruction or gangrene, not specified as recurrent: Secondary | ICD-10-CM

## 2013-09-09 NOTE — Patient Instructions (Signed)
My office will contact you next week to schedule surgery Please call me if you haven't heard from my office by 9/7  Hernia Repair with Laparoscope A hernia occurs when an internal organ pushes out through a weak spot in the belly (abdominal) wall muscles. Hernias most commonly occur in the groin and around the navel. Hernias can also occur through a cut by the surgeon (incision) after an abdominal operation. A hernia may be caused by:  Lifting heavy objects.  Prolonged coughing.  Straining to move your bowels. Hernias can often be pushed back into place (reduced). Most hernias tend to get worse over time. Problems occur when abdominal contents get stuck in the opening and the blood supply is blocked or impaired (incarcerated hernia). Because of these risks, you require surgery to repair the hernia. Your hernia will be repaired using a laparoscope. Laparoscopic surgery is a type of minimally invasive surgery. It does not involve making a typical surgical cut (incision) in the skin. A laparoscope is a telescope-like rod and lens system. It is usually connected to a video camera and a light source so your caregiver can clearly see the operative area. The instruments are inserted through  to  inch (5 mm or 10 mm) openings in the skin at specific locations. A working and viewing space is created by blowing a small amount of carbon dioxide gas into the abdominal cavity. The abdomen is essentially blown up like a balloon (insufflated). This elevates the abdominal wall above the internal organs like a dome. The carbon dioxide gas is common to the human body and can be absorbed by tissue and removed by the respiratory system. Once the repair is completed, the small incisions will be closed with either stitches (sutures) or staples (just like a paper stapler only this staple holds the skin together). LET YOUR CAREGIVERS KNOW ABOUT:  Allergies.  Medications taken including herbs, eye drops, over the counter  medications, and creams.  Use of steroids (by mouth or creams).  Previous problems with anesthetics or Novocaine.  Possibility of pregnancy, if this applies.  History of blood clots (thrombophlebitis).  History of bleeding or blood problems.  Previous surgery.  Other health problems. BEFORE THE PROCEDURE  Laparoscopy can be done either in a hospital or out-patient clinic. You may be given a mild sedative to help you relax before the procedure. Once in the operating room, you will be given a general anesthesia to make you sleep (unless you and your caregiver choose a different anesthetic).  AFTER THE PROCEDURE  After the procedure you will be watched in a recovery area. Depending on what type of hernia was repaired, you might be admitted to the hospital or you might go home the same day. With this procedure you may have less pain and scarring. This usually results in a quicker recovery and less risk of infection. HOME CARE INSTRUCTIONS   Bed rest is not required. You may continue your normal activities but avoid heavy lifting (more than 10 pounds) or straining.  Cough gently. If you are a smoker it is best to stop, as even the best hernia repair can break down with the continual strain of coughing.  Avoid driving until given the OK by your surgeon.  There are no dietary restrictions unless given otherwise.  TAKE ALL MEDICATIONS AS DIRECTED.  Only take over-the-counter or prescription medicines for pain, discomfort, or fever as directed by your caregiver. SEEK MEDICAL CARE IF:   There is increasing abdominal pain or pain  in your incisions.  There is more bleeding from incisions, other than minimal spotting.  You feel light headed or faint.  You develop an unexplained fever, chills, and/or an oral temperature above 102 F (38.9 C).  You have redness, swelling, or increasing pain in the wound.  Pus coming from wound.  A foul smell coming from the wound or dressings. SEEK  IMMEDIATE MEDICAL CARE IF:   You develop a rash.  You have difficulty breathing.  You have any allergic problems. MAKE SURE YOU:   Understand these instructions.  Will watch your condition.  Will get help right away if you are not doing well or get worse. Document Released: 12/30/2004 Document Revised: 03/24/2011 Document Reviewed: 11/29/2008 [facility] Patient Information 2015 [place], [place]. This information is not intended to replace advice given to you by your health care provider. Make sure you discuss any questions you have with your health care provider.

## 2013-09-09 NOTE — Progress Notes (Signed)
Patient ID: Mandy Delacruz, female   DOB: 1945/05/17, 68 y.o.   MRN: [id number]  Chief Complaint  Patient presents with  . Routine Post Op    hernia    HPI Mandy Delacruz is a 68 y.o. female.   HPI 68 year old Caucasian female comes in to clean repair of her left inguinal hernia. I initially met her in March of this year. At that time we diagnosed a left inguinal hernia and talked about laparoscopic repair. She postponed surgery. She states that she is now having more symptoms in her left groin. She is still having some burning sensation in her left in the region it does not occur everyday. It generally bothers her when she's been very busy that day. She also noticed that when she has episodes of diarrhea the left groin hurts more as well. The bulge always is reducible. It has never been hard or tender to touch. She states her blood pressure is much better now that she is on a pressure medications. She continues to smoke. She is trying to cut down. She has recently started using some e-cigarette Past Medical History  Diagnosis Date  . Diverticulosis of colon (without mention of hemorrhage) 2001  . Other esophagitis 2001  . Hiatal hernia 2001  . Colon polyps 2001  . GERD (gastroesophageal reflux disease)   . Hypertension     Past Surgical History  Procedure Laterality Date  . Cholecystectomy    . Inguinal hernia repair      right  . Orif hip fracture  2006    3 pins, right  . Breast biospy      left, benign  . Colonoscopy      Family History  Problem Relation Age of Onset  . Celiac disease      neice, Sister's daughter  . Colon cancer Neg Hx   . Esophageal cancer Neg Hx   . Rectal cancer Neg Hx   . Stomach cancer Neg Hx   . COPD Mother   . Hypertension Father     Social History History  Substance Use Topics  . Smoking status: Current Every Day Smoker -- 1.00 packs/day for 45 years    Types: Cigarettes  . Smokeless tobacco: Never Used     Comment: Tobacco info given  03/18/12  . Alcohol Use: 0.0 oz/week     Comment: social    Allergies  Allergen Reactions  . Codeine Nausea Only    Current Outpatient Prescriptions  Medication Sig Dispense Refill  . acetaminophen (TYLENOL) 325 MG tablet Take 650 mg by mouth every 6 (six) hours as needed.      Marland Kitchen alendronate (FOSAMAX) 10 MG tablet Take 10 mg by mouth daily before breakfast. Take with a full glass of water on an empty stomach.      Marland Kitchen amLODipine (NORVASC) 2.5 MG tablet Take 1 tablet (2.5 mg total) by mouth daily.  90 tablet  3  . azithromycin (ZITHROMAX) 250 MG tablet Take by mouth daily.      . benzonatate (TESSALON) 100 MG capsule Take 1-2 capsules (100-200 mg total) by mouth 3 (three) times daily as needed for cough.  40 capsule  0  . cefdinir (OMNICEF) 300 MG capsule Take 2 capsules (600 mg total) by mouth daily.  20 capsule  0  . Dextromethorphan-Guaifenesin (CORICIDIN HBP CONGESTION/COUGH PO) Take by mouth daily.      Marland Kitchen ibuprofen (ADVIL) 200 MG tablet Take 200 mg by mouth daily.      Marland Kitchen  lisinopril (PRINIVIL,ZESTRIL) 20 MG tablet Take 20 mg by mouth daily.      Marland Kitchen losartan (COZAAR) 25 MG tablet Take 25 mg by mouth daily.      Marland Kitchen omeprazole (PRILOSEC OTC) 20 MG tablet Take 20 mg by mouth daily.       No current facility-administered medications for this visit.    Review of Systems Review of Systems  Constitutional: Negative for fever, activity change, appetite change and unexpected weight change.  HENT: Negative for nosebleeds and trouble swallowing.   Eyes: Negative for photophobia and visual disturbance.  Respiratory: Negative for chest tightness and shortness of breath.   Cardiovascular: Negative for chest pain and leg swelling.       Denies CP, SOB, orthopnea, PND, DOE  Gastrointestinal: Negative for nausea, vomiting, abdominal pain and constipation.  Genitourinary: Positive for frequency. Negative for dysuria, hematuria and difficulty urinating.  Musculoskeletal: Negative for arthralgias.    Skin: Negative for pallor and rash.  Neurological: Negative for dizziness, seizures, facial asymmetry and numbness.       Denies TIA and amaurosis fugax   Hematological: Negative for adenopathy. Does not bruise/bleed easily.  Psychiatric/Behavioral: Negative for behavioral problems and agitation.    Blood pressure 110/70, pulse 62, temperature 98 F (36.7 C), resp. rate 18, height 5' 5"  (1.651 m), weight 149 lb 12.8 oz (67.949 kg).  Physical Exam Physical Exam  Vitals reviewed. Constitutional: She is oriented to person, place, and time. She appears well-developed and well-nourished. No distress.  HENT:  Head: Normocephalic and atraumatic.  Right Ear: External ear normal.  Left Ear: External ear normal.  Eyes: Conjunctivae are normal. No scleral icterus.  Neck: Normal range of motion. Neck supple. No tracheal deviation present. No thyromegaly present.  Cardiovascular: Normal rate and normal heart sounds.   Pulmonary/Chest: Effort normal and breath sounds normal. No stridor. No respiratory distress. She has no wheezes.  Abdominal: Soft. She exhibits no distension. There is no tenderness. There is no rebound and no guarding. A hernia is present. Hernia confirmed positive in the left inguinal area. Hernia confirmed negative in the ventral area and confirmed negative in the right inguinal area.    Left inguinal bulge with standing; reducible. Nontender.   Musculoskeletal: She exhibits no edema and no tenderness.  Lymphadenopathy:    She has no cervical adenopathy.  Neurological: She is alert and oriented to person, place, and time. She exhibits normal muscle tone.  Skin: Skin is warm and dry. No rash noted. She is not diaphoretic. No erythema.  Psychiatric: She has a normal mood and affect. Her behavior is normal. Judgment and thought content normal.    Data Reviewed My office note  Assessment    Left inguinal hernia     Plan    We re-discussed the etiology of inguinal  hernias. We rediscussed the signs & symptoms of incarceration & strangulation.  The patient has elected laparoscopic repair of left inguinal hernia with mesh   I described the procedure in detail.   We discussed the risks and benefits including but not limited to bleeding, infection, chronic inguinal pain, nerve entrapment, hernia recurrence, mesh complications, hematoma formation, urinary retention, injury to the ovaries, numbness in the groin, blood clots, injury to the surrounding structures, and anesthesia risk. We also discussed the typical post operative recovery course, including no heavy lifting for 4-6 weeks. I explained that the likelihood of improvement of their symptoms is good  She has a trip scheduled for early October and would like  to delay surgical after that trip-I think that is not unreasonable. I explained our schedulers would contact her over the next week to schedule surgery.  Note: This dictation was prepared with Dragon/digital dictation along with [employer]. Any transcriptional errors that result from this process are unintentional.  Leighton Ruff. Redmond Pulling, MD, FACS General, Bariatric, & Minimally Invasive Surgery [facility], [place]          [facility] M 09/09/2013, 10:34 AM

## 2013-10-05 ENCOUNTER — Encounter (INDEPENDENT_AMBULATORY_CARE_PROVIDER_SITE_OTHER): Payer: BC Managed Care – PPO | Admitting: General Surgery

## 2013-10-13 HISTORY — PX: INGUINAL HERNIA REPAIR: SUR1180

## 2013-10-18 ENCOUNTER — Ambulatory Visit: Payer: BC Managed Care – PPO | Admitting: Emergency Medicine

## 2013-11-30 ENCOUNTER — Other Ambulatory Visit (INDEPENDENT_AMBULATORY_CARE_PROVIDER_SITE_OTHER): Payer: Self-pay

## 2013-11-30 DIAGNOSIS — Z9889 Other specified postprocedural states: Principal | ICD-10-CM

## 2013-11-30 DIAGNOSIS — Z8719 Personal history of other diseases of the digestive system: Secondary | ICD-10-CM

## 2013-12-01 ENCOUNTER — Ambulatory Visit
Admission: RE | Admit: 2013-12-01 | Discharge: 2013-12-01 | Disposition: A | Discharge status: Home / Self Care | Payer: BC Managed Care – PPO | Source: Ambulatory Visit | Attending: General Surgery | Admitting: General Surgery

## 2013-12-01 DIAGNOSIS — Z9889 Other specified postprocedural states: Principal | ICD-10-CM

## 2013-12-01 DIAGNOSIS — Z8719 Personal history of other diseases of the digestive system: Secondary | ICD-10-CM

## 2013-12-01 MED ORDER — IOHEXOL 300 MG/ML  SOLN
100.0000 mL | Freq: Once | INTRAMUSCULAR | Status: AC | PRN
  Administered 2013-12-01: 100 mL via INTRAVENOUS

## 2013-12-02 ENCOUNTER — Telehealth (INDEPENDENT_AMBULATORY_CARE_PROVIDER_SITE_OTHER): Payer: Self-pay | Admitting: General Surgery

## 2013-12-02 ENCOUNTER — Inpatient Hospital Stay: Admission: RE | Admit: 2013-12-02 | Payer: BC Managed Care – PPO | Source: Ambulatory Visit

## 2013-12-02 NOTE — Telephone Encounter (Signed)
Called to discuss CT results. Mesh appears intact without evidence of hernia recurrence. Surrounding muscle does appear attenuated so do expect some give in that area. Also small pocket fluid in groing c/w seroma. Explained it will take time for fluid and intermittent discomfort to go away. Also explained that there will be some laxity to that side compared to other side. Pt voiced understanding. All questions answered.

## 2014-07-10 ENCOUNTER — Other Ambulatory Visit: Payer: Self-pay

## 2014-12-13 ENCOUNTER — Ambulatory Visit: Payer: Self-pay | Admitting: General Surgery

## 2014-12-25 NOTE — Patient Instructions (Addendum)
Mandy Delacruz  12/25/2014   Your procedure is scheduled on: 01-02-15  Report to [facility] Main  Entrance take [facility]  elevators to 3rd floor to  [place] at 945 AM.  Call this number if you have problems the morning of surgery [phone]   Remember: ONLY 1 PERSON MAY GO WITH YOU TO SHORT STAY TO GET  READY MORNING OF [place].  Do not eat food or drink liquids :After Midnight.     Take these medicines the morning of surgery with A SIP OF WATER: Bactrim DS, Proair inhaler as needed and bring inhaler                               You may not have any metal on your body including hair pins and              piercings  Do not wear jewelry, make-up, lotions, powders or perfumes, deodorant             Do not wear nail polish.  Do not shave  48 hours prior to surgery.              Men may shave face and neck.   Do not bring valuables to the hospital. [place].  Contacts, dentures or bridgework may not be worn into surgery.  Leave suitcase in the car. After surgery it may be brought to your room.     Patients discharged the day of surgery will not be allowed to drive home.  Name and phone number of your driver:  Special Instructions: N/A              Please read over the following fact sheets you were given: _____________________________________________________________________             [facility] - Preparing for Surgery Before surgery, you can play an important role.  Because skin is not sterile, your skin needs to be as free of germs as possible.  You can reduce the number of germs on your skin by washing with CHG (chlorahexidine gluconate) soap before surgery.  CHG is an antiseptic cleaner which kills germs and bonds with the skin to continue killing germs even after washing. Please DO NOT use if you have an allergy to CHG or antibacterial soaps.  If your skin becomes reddened/irritated stop  using the CHG and inform your nurse when you arrive at Short Stay. Do not shave (including legs and underarms) for at least 48 hours prior to the first CHG shower.  You may shave your face/neck. Please follow these instructions carefully:  1.  Shower with CHG Soap the night before surgery and the  morning of Surgery.  2.  If you choose to wash your hair, wash your hair first as usual with your  normal  shampoo.  3.  After you shampoo, rinse your hair and body thoroughly to remove the  shampoo.                           4.  Use CHG as you would any other liquid soap.  You can apply chg directly  to the skin and wash  Gently with a scrungie or clean washcloth.  5.  Apply the CHG Soap to your body ONLY FROM THE NECK DOWN.   Do not use on face/ open                           Wound or open sores. Avoid contact with eyes, ears mouth and genitals (private parts).                       Wash face,  Genitals (private parts) with your normal soap.             6.  Wash thoroughly, paying special attention to the area where your surgery  will be performed.  7.  Thoroughly rinse your body with warm water from the neck down.  8.  DO NOT shower/wash with your normal soap after using and rinsing off  the CHG Soap.                9.  Pat yourself dry with a clean towel.            10.  Wear clean pajamas.            11.  Place clean sheets on your bed the night of your first shower and do not  sleep with pets. Day of Surgery : Do not apply any lotions/deodorants the morning of surgery.  Please wear clean clothes to the hospital/surgery center.  FAILURE TO FOLLOW THESE INSTRUCTIONS MAY RESULT IN THE CANCELLATION OF YOUR SURGERY PATIENT SIGNATURE_________________________________  NURSE SIGNATURE__________________________________  ________________________________________________________________________

## 2014-12-28 ENCOUNTER — Encounter (HOSPITAL_COMMUNITY)
Admission: RE | Admit: 2014-12-28 | Discharge: 2014-12-28 | Disposition: A | Discharge status: Home / Self Care | Payer: BLUE CROSS/BLUE SHIELD | Source: Ambulatory Visit | Attending: General Surgery | Admitting: General Surgery

## 2014-12-28 ENCOUNTER — Ambulatory Visit (HOSPITAL_COMMUNITY)
Admission: RE | Admit: 2014-12-28 | Discharge: 2014-12-28 | Disposition: A | Discharge status: Home / Self Care | Payer: BLUE CROSS/BLUE SHIELD | Source: Ambulatory Visit | Attending: Anesthesiology | Admitting: Anesthesiology

## 2014-12-28 ENCOUNTER — Encounter (HOSPITAL_COMMUNITY): Payer: Self-pay

## 2014-12-28 DIAGNOSIS — Z01812 Encounter for preprocedural laboratory examination: Secondary | ICD-10-CM | POA: Diagnosis not present

## 2014-12-28 DIAGNOSIS — Z01818 Encounter for other preprocedural examination: Secondary | ICD-10-CM | POA: Insufficient documentation

## 2014-12-28 HISTORY — DX: Unilateral inguinal hernia, without obstruction or gangrene, not specified as recurrent: K40.90

## 2014-12-28 HISTORY — DX: Dizziness and giddiness: R42

## 2014-12-28 HISTORY — DX: Bronchitis, not specified as acute or chronic: J40

## 2014-12-28 LAB — BASIC METABOLIC PANEL
ANION GAP: 9 (ref 5–15)
BUN: 21 mg/dL — ABNORMAL HIGH (ref 6–20)
CHLORIDE: 102 mmol/L (ref 101–111)
CO2: 27 mmol/L (ref 22–32)
CREATININE: 1.08 mg/dL — AB (ref 0.44–1.00)
Calcium: 9.7 mg/dL (ref 8.9–10.3)
GFR calc non Af Amer: 52 mL/min — ABNORMAL LOW (ref >60)
GFR, EST AFRICAN AMERICAN: 60 mL/min — AB (ref >60)
Glucose, Bld: 122 mg/dL — ABNORMAL HIGH (ref 65–99)
POTASSIUM: 4.5 mmol/L (ref 3.5–5.1)
SODIUM: 138 mmol/L (ref 135–145)

## 2014-12-28 LAB — CBC
HEMATOCRIT: 39.5 % (ref 36.0–46.0)
HEMOGLOBIN: 13.2 g/dL (ref 12.0–15.0)
MCH: 31.2 pg (ref 26.0–34.0)
MCHC: 33.4 g/dL (ref 30.0–36.0)
MCV: 93.4 fL (ref 78.0–100.0)
Platelets: 224 10*3/uL (ref 150–400)
RBC: 4.23 MIL/uL (ref 3.87–5.11)
RDW: 13.1 % (ref 11.5–15.5)
WBC: 5 10*3/uL (ref 4.0–10.5)

## 2014-12-28 NOTE — Progress Notes (Signed)
Spoke with dr Landry Dyke, pt on antibiotic for bronchitis, spitting up yellow sputum, current smoker, dr ewell ordered chest xray with pre op today

## 2015-01-01 NOTE — H&P (Signed)
Mandy Delacruz. Trnka 12/13/2014 8:37 AM Location: [facility] Patient #: [id number] DOB: Feb 26, 1945 Married / Language: English / Race: White Female   History of Present Illness Mandy Delacruz M. Vi [name] MD; 12/13/2014 9:17 AM) The patient is a 69 year old female who presents for an evaluation of a hernia. She comes in for long-term follow-up. She underwent laparoscopic repair of a large left indirect hernia with mesh on October 25, 2013. I last saw her in November of last year. At that time she had what was felt to be a seroma. We performed a CT scan which did not really demonstrate a recurrent inguinal hernia however there was some asymmetry into the left groin compared to the right groin. She states that she is been having ongoing issues with her seroma. She states that about once a week the seroma will swell and get hard and she will also have burning in her left groin that'll radiate into her hairline and a little bit into her left medial thigh. The burning will last for about a day and go away. She denies any nausea, vomiting, constipation. She denies any new medical issues since she was last seen. She denies any trips to the emergency room. She continues to smoke. She smokes up to 1 pack per day. She denies any chest pain, chest pressure, TIAs or amaurosis fugax.   Problem List/Past Medical Mandy Delacruz Mandy Derby, MD; 12/13/2014 9:19 AM) TOBACCO DEPENDENCE (F17.200) RECURRENT LEFT INGUINAL HERNIA (K40.91) LEFT GROIN HERNIA (K40.90) STATUS POST LAPAROSCOPIC HERNIA REPAIR [phone])  Other Problems Mandy Curry, MD; 12/13/2014 9:19 AM) Inguinal Hernia Hypercholesterolemia Lump In Breast Diverticulosis Back Pain High blood pressure Gastroesophageal Reflux Disease  Past Surgical History Mandy Curry, MD; 12/13/2014 9:19 AM) Laparoscopic Inguinal Hernia Surgery Bilateral. Gallbladder Surgery - Laparoscopic Tonsillectomy Colon Polyp Removal - Colonoscopy Breast  Biopsy Left.  Diagnostic Studies History Mandy Curry, MD; 12/13/2014 9:19 AM) Mammogram >3 years ago Colonoscopy 1-5 years ago Pap Smear 1-5 years ago  Allergies Mandy Curry, MD; 12/13/2014 9:19 AM) Codeine Sulfate *ANALGESICS - OPIOID* Codeine/Codeine Derivatives  Medication History Mandy Curry, MD; 12/13/2014 9:19 AM) Losartan Potassium-HCTZ (50-12.5MG  Tablet, Oral) Active. PriLOSEC (20MG  Capsule DR, Oral) Active. Ibuprofen (200MG  Capsule, Oral) Active. Dextromethorphan-Guaifenesin (15-200MG  Tablet, Oral) Active. Omnicef (300MG  Capsule, Oral) Active. Tessalon Perles (100MG  Capsule, Oral) Active. Fosamax (10MG  Tablet, Oral) Active. Tylenol (325MG  Tablet, Oral) Active. Medications Reconciled OxyCODONE HCl (5MG  Capsule, 1-2 Capsule Oral q 4 hours prn pain, Taken starting 10/25/2013) Active.  Social History Mandy Curry, MD; 12/13/2014 9:19 AM) Tobacco use Current every day smoker. No drug use Caffeine use Carbonated beverages, Tea. Alcohol use Occasional alcohol use.  Family History Mandy Curry, MD; 12/13/2014 9:19 AM) Alcohol Abuse Mother. Cancer Mother. Thyroid problems Mother. Heart Disease Mother. Depression Mother. Respiratory Condition Father, Mother. Hypertension Father, Mother.  Pregnancy / Birth History Mandy Curry, MD; 12/13/2014 9:19 AM) Age of menopause 74-55 Age at menarche 74 years. Contraceptive History Contraceptive implant, Oral contraceptives. Maternal age 17-25 Gravida 2 Para 2    Review of Systems Mandy Delacruz M. Gustabo [name] MD; 12/13/2014 9:14 AM) General Not Present- Appetite Loss, Chills, Fatigue, Fever, Night Sweats, Weight Gain and Weight Loss. Skin Not Present- Change in Wart/Mole, Dryness, Hives, Jaundice, New Lesions, Non-Healing Wounds, Rash and Ulcer. HEENT Present- Wears glasses/contact lenses. Not Present- Earache, Hearing Loss, Hoarseness, Nose Bleed, Oral Ulcers, Ringing in the Ears, Seasonal  Allergies, Sinus Pain, Sore Throat, Visual Disturbances and Yellow Eyes. Respiratory Not Present- Bloody  sputum, Chronic Cough, Difficulty Breathing, Snoring and Wheezing. Breast Not Present- Breast Mass, Breast Pain, Nipple Discharge and Skin Changes. Cardiovascular Not Present- Chest Pain, Difficulty Breathing Lying Down, Leg Cramps, Palpitations, Rapid Heart Rate, Shortness of Breath and Swelling of Extremities. Gastrointestinal Not Present- Abdominal Pain, Bloating, Bloody Stool, Change in Bowel Habits, Chronic diarrhea, Constipation, Difficulty Swallowing, Excessive gas, Gets full quickly at meals, Hemorrhoids, Indigestion, Nausea, Rectal Pain and Vomiting. Musculoskeletal Not Present- Back Pain, Joint Pain, Joint Stiffness, Muscle Pain, Muscle Weakness and Swelling of Extremities. Neurological Not Present- Decreased Memory, Fainting, Headaches, Numbness, Seizures, Tingling, Tremor, Trouble walking and Weakness. Psychiatric Not Present- Anxiety, Bipolar, Change in Sleep Pattern, Depression, Fearful and Frequent crying. Endocrine Not Present- Cold Intolerance, Excessive Hunger, Hair Changes, Heat Intolerance, Hot flashes and New Diabetes. Hematology Not Present- Easy Bruising, Excessive bleeding, Gland problems, HIV and Persistent Infections.  Vitals Mandy Delacruz CMA; 12/13/2014 8:40 AM) 12/13/2014 8:40 AM Weight: 155 lb Height: 66in Body Surface Area: 1.79 m Body Mass Index: 25.02 kg/m  Temp.: 97.28F(Temporal)  Pulse: 82 (Regular)  BP: 128/72 (Sitting, Left Arm, Standard)       Physical Exam Mandy Delacruz M. Bentleigh Waren MD; 12/13/2014 9:14 AM) General Mental Status-Alert. General Appearance-Consistent with stated age. Hydration-Well hydrated. Voice-Normal.  Head and Neck Head-normocephalic, atraumatic with no lesions or palpable masses. Trachea-midline. Thyroid Gland Characteristics - normal size and consistency.  Eye Eyeball - Bilateral-Extraocular movements  intact. Sclera/Conjunctiva - Bilateral-No scleral icterus.  Chest and Lung Exam Chest and lung exam reveals -quiet, even and easy respiratory effort with no use of accessory muscles and on auscultation, normal breath sounds, no adventitious sounds and normal vocal resonance. Inspection Chest Wall - Normal. Back - normal.  Breast - Did not examine.  Cardiovascular Cardiovascular examination reveals -normal heart sounds, regular rate and rhythm with no murmurs and normal pedal pulses bilaterally.  Abdomen Inspection Inspection of the abdomen reveals - No Hernias. Skin - Scar - Note: old trocar scars. Palpation/Percussion Palpation and Percussion of the abdomen reveal - Soft, Non Tender, No Rebound tenderness, No Rigidity (guarding) and No hepatosplenomegaly. Auscultation Auscultation of the abdomen reveals - Bowel sounds normal.  Female Genitourinary Note: obvious large bulge in left groin, reducible, nontender; chaperone present   Peripheral Vascular Upper Extremity Palpation - Pulses bilaterally normal.  Neurologic Neurologic evaluation reveals -alert and oriented x 3 with no impairment of recent or remote memory. Mental Status-Normal.  Neuropsychiatric The patient's mood and affect are described as -normal. Judgment and Insight-insight is appropriate concerning matters relevant to self.  Musculoskeletal Normal Exam - Left-Upper Extremity Strength Normal and Lower Extremity Strength Normal. Normal Exam - Right-Upper Extremity Strength Normal and Lower Extremity Strength Normal.  Lymphatic Head & Neck  General Head & Neck Lymphatics: Bilateral - Description - Normal. Axillary - Did not examine. Femoral & Inguinal - Did not examine.    Assessment & Plan Mandy Delacruz M. Kedric Bumgarner MD; 12/13/2014 9:19 AM) RECURRENT LEFT INGUINAL HERNIA (K40.91) Impression: Unfortunately it appears that she has a recurrence of her inguinal hernia. It also appears that she is  having possible intermittent episodes of incarceration. We discussed signs and symptoms of incarceration and strangulation. At this point I think nonoperative management is not even an option. We discussed that this needs to be surgically repaired.  I have recommended a diagnostic laparoscopy with open repair of recurrent left inguinal hernia with mesh.  I described the procedure in detail. The patient was given Neurosurgeon. We discussed the risks and benefits including but not  limited to bleeding, infection, chronic inguinal pain, nerve entrapment, hernia recurrence, mesh complications, hematoma formation, urinary retention, injury to surrounding structures, numbness in the groin, blood clots, injury to the surrounding structures, and anesthesia risk. We also discussed the typical post operative recovery course, including no heavy lifting for 6 weeks. I explained that the likelihood of improvement of their symptoms is good. I did explain that with her ongoing smoking and the fat of this is a recurrent inguinal hernia her risk of recurrence is even higher than her initial repair. We discussed the importance of smoking cessation. Current Plans Pt Education - Pamphlet Given - Hernia Surgery: discussed with patient and provided information. You are being scheduled for surgery - Our schedulers will call you.  You should hear from our office's scheduling department within 5 working days about the location, date, and time of surgery. We try to make accommodations for patient's preferences in scheduling surgery, but sometimes the OR schedule or the surgeon's schedule prevents [place] from making those accommodations.  If you have not heard from our office [phone]) in 5 working days, call the office and ask for your surgeon's nurse.  If you have other questions about your diagnosis, plan, or surgery, call the office and ask for your surgeon's nurse.  TOBACCO DEPENDENCE (F17.200) Current Plans Pt  Education - Smoking: Ways to Quit: addiction  Leighton Ruff. Redmond Pulling, MD, FACS General, Bariatric, & Minimally Invasive Surgery [facility], [place]

## 2015-01-02 ENCOUNTER — Encounter (HOSPITAL_COMMUNITY)
Admission: RE | Disposition: A | Discharge status: Home / Self Care | Payer: Self-pay | Source: Ambulatory Visit | Attending: General Surgery

## 2015-01-02 ENCOUNTER — Ambulatory Visit (HOSPITAL_COMMUNITY)
Admission: RE | Admit: 2015-01-02 | Discharge: 2015-01-02 | Disposition: A | Discharge status: Home / Self Care | Payer: BLUE CROSS/BLUE SHIELD | Source: Ambulatory Visit | Attending: General Surgery | Admitting: General Surgery

## 2015-01-02 ENCOUNTER — Ambulatory Visit (HOSPITAL_COMMUNITY): Payer: BLUE CROSS/BLUE SHIELD | Admitting: Anesthesiology

## 2015-01-02 ENCOUNTER — Encounter (HOSPITAL_COMMUNITY): Payer: Self-pay | Admitting: *Deleted

## 2015-01-02 DIAGNOSIS — E7800 Pure hypercholesterolemia, unspecified: Secondary | ICD-10-CM | POA: Insufficient documentation

## 2015-01-02 DIAGNOSIS — K4091 Unilateral inguinal hernia, without obstruction or gangrene, recurrent: Secondary | ICD-10-CM | POA: Diagnosis not present

## 2015-01-02 DIAGNOSIS — I10 Essential (primary) hypertension: Secondary | ICD-10-CM | POA: Insufficient documentation

## 2015-01-02 DIAGNOSIS — K219 Gastro-esophageal reflux disease without esophagitis: Secondary | ICD-10-CM | POA: Insufficient documentation

## 2015-01-02 DIAGNOSIS — F17210 Nicotine dependence, cigarettes, uncomplicated: Secondary | ICD-10-CM | POA: Diagnosis not present

## 2015-01-02 DIAGNOSIS — Z5309 Procedure and treatment not carried out because of other contraindication: Secondary | ICD-10-CM | POA: Diagnosis not present

## 2015-01-02 DIAGNOSIS — Z7983 Long term (current) use of bisphosphonates: Secondary | ICD-10-CM | POA: Insufficient documentation

## 2015-01-02 DIAGNOSIS — Z79899 Other long term (current) drug therapy: Secondary | ICD-10-CM | POA: Insufficient documentation

## 2015-01-02 SURGERY — LAPAROSCOPY, DIAGNOSTIC
Anesthesia: General

## 2015-01-02 MED ORDER — ONDANSETRON HCL 4 MG/2ML IJ SOLN
INTRAMUSCULAR | Status: AC
  Filled 2015-01-02: qty 2

## 2015-01-02 MED ORDER — BUPIVACAINE-EPINEPHRINE (PF) 0.25% -1:200000 IJ SOLN
INTRAMUSCULAR | Status: AC
  Filled 2015-01-02: qty 30

## 2015-01-02 MED ORDER — LIDOCAINE HCL (CARDIAC) 20 MG/ML IV SOLN
INTRAVENOUS | Status: AC
  Filled 2015-01-02: qty 5

## 2015-01-02 MED ORDER — PROPOFOL 10 MG/ML IV BOLUS
INTRAVENOUS | Status: AC
  Filled 2015-01-02: qty 20

## 2015-01-02 MED ORDER — CHLORHEXIDINE GLUCONATE 4 % EX LIQD: 1.0000 "application " | Freq: Once | CUTANEOUS | Status: DC

## 2015-01-02 MED ORDER — SUFENTANIL CITRATE 50 MCG/ML IV SOLN
INTRAVENOUS | Status: AC
  Filled 2015-01-02: qty 1

## 2015-01-02 MED ORDER — SODIUM CHLORIDE 0.9 % IJ SOLN
INTRAMUSCULAR | Status: AC
  Filled 2015-01-02: qty 10

## 2015-01-02 MED ORDER — MIDAZOLAM HCL 2 MG/2ML IJ SOLN
INTRAMUSCULAR | Status: AC
  Filled 2015-01-02: qty 2

## 2015-01-02 MED ORDER — CEFAZOLIN SODIUM-DEXTROSE 2-3 GM-% IV SOLR: 2.0000 g | INTRAVENOUS | Status: DC

## 2015-01-02 NOTE — Progress Notes (Signed)
Patient has been taking an antibiotic for recent bronchitis. Patient is still coughing some but only now only  has small amt clear mucus

## 2015-01-02 NOTE — Progress Notes (Signed)
Returned to short stay from [facility] holding room. Surgery cancelled due to cough. Patient knows to call office to reschedule surgery. Discharged via ambulatory with husband to car

## 2015-01-02 NOTE — Progress Notes (Signed)
On admission to OR holding room, pt. Experiencing severe coughing spasms.  Productive of small amounts of thick yellowish, white sputum.  States completing round of antibiotics prescribed six days ago.  Has not had good results with cough suppressants thus far and that episodes of cough are occurrrSr. ng at least 5-6 times overt he period of the day and the coughing is hard to stop when started. Dr. Redmond Pulling and anesthesiologist Dr. Landry Dyke notified of cough and current symptoms.  Dr. Redmond Pulling states to cancel surgery at present as this is a recurrent hernia already, to reschedule once cough symptoms have been controlled.  Husband and patient notified and agreeable.  Pt. Transferred back to [facility] to dress for discharge.

## 2015-01-02 NOTE — Anesthesia Preprocedure Evaluation (Deleted)
Anesthesia Evaluation  Patient identified by MRN, date of birth, ID band Patient awake    Reviewed: Allergy & Precautions, H&P , NPO status , Patient's Chart, lab work & pertinent test results  Airway Mallampati: II  TM Distance: >3 FB Neck ROM: full    Dental no notable dental hx. (+) Dental Advisory Given, Teeth Intact   Pulmonary Current Smoker,    Pulmonary exam normal breath sounds clear to auscultation       Cardiovascular Exercise Tolerance: Good hypertension, Pt. on medications Normal cardiovascular exam Rhythm:regular Rate:Normal  Dizziness - inner ear   Neuro/Psych negative neurological ROS  negative psych ROS   GI/Hepatic negative GI ROS, Neg liver ROS, GERD  Medicated and Controlled,  Endo/Other  negative endocrine ROS  Renal/GU negative Renal ROS  negative genitourinary   Musculoskeletal   Abdominal   Peds  Hematology negative hematology ROS (+)   Anesthesia Other Findings   Reproductive/Obstetrics negative OB ROS                             Anesthesia Physical Anesthesia Plan  ASA: II  Anesthesia Plan: General   Post-op Pain Management:    Induction: Intravenous  Airway Management Planned: Oral ETT  Additional Equipment:   Intra-op Plan:   Post-operative Plan: Extubation in OR  Informed Consent: I have reviewed the patients History and Physical, chart, labs and discussed the procedure including the risks, benefits and alternatives for the proposed anesthesia with the patient or authorized representative who has indicated his/her understanding and acceptance.   Dental Advisory Given  Plan Discussed with: CRNA and Surgeon  Anesthesia Plan Comments:         Anesthesia Quick Evaluation

## 2015-01-04 ENCOUNTER — Encounter (HOSPITAL_COMMUNITY): Payer: Self-pay | Admitting: *Deleted

## 2015-01-09 ENCOUNTER — Ambulatory Visit: Payer: Self-pay | Admitting: General Surgery

## 2015-01-10 ENCOUNTER — Encounter (HOSPITAL_COMMUNITY)
Admission: RE | Disposition: A | Discharge status: Home / Self Care | Payer: Self-pay | Source: Ambulatory Visit | Attending: General Surgery

## 2015-01-10 ENCOUNTER — Ambulatory Visit (HOSPITAL_COMMUNITY)
Admission: RE | Admit: 2015-01-10 | Discharge: 2015-01-10 | Disposition: A | Discharge status: Home / Self Care | Payer: BLUE CROSS/BLUE SHIELD | Source: Ambulatory Visit | Attending: General Surgery | Admitting: General Surgery

## 2015-01-10 ENCOUNTER — Ambulatory Visit (HOSPITAL_COMMUNITY): Payer: BLUE CROSS/BLUE SHIELD | Admitting: Certified Registered Nurse Anesthetist

## 2015-01-10 ENCOUNTER — Encounter (HOSPITAL_COMMUNITY): Payer: Self-pay | Admitting: *Deleted

## 2015-01-10 DIAGNOSIS — K219 Gastro-esophageal reflux disease without esophagitis: Secondary | ICD-10-CM | POA: Diagnosis not present

## 2015-01-10 DIAGNOSIS — Z79899 Other long term (current) drug therapy: Secondary | ICD-10-CM | POA: Diagnosis not present

## 2015-01-10 DIAGNOSIS — K4091 Unilateral inguinal hernia, without obstruction or gangrene, recurrent: Secondary | ICD-10-CM | POA: Insufficient documentation

## 2015-01-10 DIAGNOSIS — Z791 Long term (current) use of non-steroidal anti-inflammatories (NSAID): Secondary | ICD-10-CM | POA: Diagnosis not present

## 2015-01-10 DIAGNOSIS — E7800 Pure hypercholesterolemia, unspecified: Secondary | ICD-10-CM | POA: Insufficient documentation

## 2015-01-10 DIAGNOSIS — I10 Essential (primary) hypertension: Secondary | ICD-10-CM | POA: Insufficient documentation

## 2015-01-10 DIAGNOSIS — Z79891 Long term (current) use of opiate analgesic: Secondary | ICD-10-CM | POA: Diagnosis not present

## 2015-01-10 DIAGNOSIS — G709 Myoneural disorder, unspecified: Secondary | ICD-10-CM | POA: Insufficient documentation

## 2015-01-10 DIAGNOSIS — F17200 Nicotine dependence, unspecified, uncomplicated: Secondary | ICD-10-CM | POA: Diagnosis not present

## 2015-01-10 HISTORY — PX: INSERTION OF MESH: SHX5868

## 2015-01-10 HISTORY — PX: INGUINAL HERNIA REPAIR: SHX194

## 2015-01-10 HISTORY — PX: LAPAROSCOPY: SHX197

## 2015-01-10 SURGERY — LAPAROSCOPY, DIAGNOSTIC
Anesthesia: General

## 2015-01-10 MED ORDER — PHENYLEPHRINE 40 MCG/ML (10ML) SYRINGE FOR IV PUSH (FOR BLOOD PRESSURE SUPPORT)
PREFILLED_SYRINGE | INTRAVENOUS | Status: AC
  Filled 2015-01-10: qty 10

## 2015-01-10 MED ORDER — OXYCODONE HCL 5 MG PO TABS: 5.0000 mg | ORAL_TABLET | ORAL | Status: DC | PRN

## 2015-01-10 MED ORDER — PROPOFOL 10 MG/ML IV BOLUS
INTRAVENOUS | Status: AC
  Filled 2015-01-10: qty 20

## 2015-01-10 MED ORDER — BUPIVACAINE-EPINEPHRINE (PF) 0.25% -1:200000 IJ SOLN
INTRAMUSCULAR | Status: AC
  Filled 2015-01-10: qty 60

## 2015-01-10 MED ORDER — HYDROMORPHONE HCL 1 MG/ML IJ SOLN
INTRAMUSCULAR | Status: DC
Start: 2015-01-10 — End: 2015-01-10
  Filled 2015-01-10: qty 1

## 2015-01-10 MED ORDER — MIDAZOLAM HCL 2 MG/2ML IJ SOLN
INTRAMUSCULAR | Status: AC
  Filled 2015-01-10: qty 2

## 2015-01-10 MED ORDER — HYDROMORPHONE HCL 1 MG/ML IJ SOLN
INTRAMUSCULAR | Status: AC
  Filled 2015-01-10: qty 1

## 2015-01-10 MED ORDER — LIDOCAINE HCL (CARDIAC) 20 MG/ML IV SOLN
INTRAVENOUS | Status: AC
  Filled 2015-01-10: qty 5

## 2015-01-10 MED ORDER — SODIUM CHLORIDE 0.9 % IJ SOLN
3.0000 mL | Freq: Two times a day (BID) | INTRAMUSCULAR | Status: DC
Start: 2015-01-10 — End: 2015-01-10

## 2015-01-10 MED ORDER — MIDAZOLAM HCL 5 MG/5ML IJ SOLN
INTRAMUSCULAR | Status: DC | PRN
  Administered 2015-01-10: 2 mg via INTRAVENOUS

## 2015-01-10 MED ORDER — 0.9 % SODIUM CHLORIDE (POUR BTL) OPTIME
TOPICAL | Status: DC | PRN
  Administered 2015-01-10: 1000 mL

## 2015-01-10 MED ORDER — PROPOFOL 10 MG/ML IV BOLUS
INTRAVENOUS | Status: DC | PRN
  Administered 2015-01-10: 140 mg via INTRAVENOUS

## 2015-01-10 MED ORDER — SODIUM CHLORIDE 0.9 % IJ SOLN: 3.0000 mL | INTRAMUSCULAR | Status: DC | PRN

## 2015-01-10 MED ORDER — BUPIVACAINE-EPINEPHRINE 0.25% -1:200000 IJ SOLN
INTRAMUSCULAR | Status: DC | PRN
  Administered 2015-01-10: 30 mL

## 2015-01-10 MED ORDER — HYDROMORPHONE HCL 1 MG/ML IJ SOLN: 0.2500 mg | INTRAMUSCULAR | Status: DC | PRN

## 2015-01-10 MED ORDER — FENTANYL CITRATE (PF) 250 MCG/5ML IJ SOLN
INTRAMUSCULAR | Status: AC
  Filled 2015-01-10: qty 5

## 2015-01-10 MED ORDER — SODIUM CHLORIDE 0.9 % IJ SOLN
INTRAMUSCULAR | Status: AC
  Filled 2015-01-10: qty 10

## 2015-01-10 MED ORDER — HYDROMORPHONE HCL 1 MG/ML IJ SOLN
0.2500 mg | INTRAMUSCULAR | Status: DC | PRN
  Administered 2015-01-10 (×4): 0.5 mg via INTRAVENOUS

## 2015-01-10 MED ORDER — EPHEDRINE SULFATE 50 MG/ML IJ SOLN
INTRAMUSCULAR | Status: AC
  Filled 2015-01-10: qty 1

## 2015-01-10 MED ORDER — SODIUM CHLORIDE 0.9 % IV SOLN: 250.0000 mL | INTRAVENOUS | Status: DC | PRN

## 2015-01-10 MED ORDER — LIDOCAINE HCL (CARDIAC) 20 MG/ML IV SOLN
INTRAVENOUS | Status: DC | PRN
  Administered 2015-01-10: 60 mg via INTRATRACHEAL
  Administered 2015-01-10: 80 mg via INTRAVENOUS

## 2015-01-10 MED ORDER — ONDANSETRON HCL 4 MG/2ML IJ SOLN
INTRAMUSCULAR | Status: AC
  Filled 2015-01-10: qty 2

## 2015-01-10 MED ORDER — LACTATED RINGERS IV SOLN: INTRAVENOUS | Status: DC

## 2015-01-10 MED ORDER — ACETAMINOPHEN 325 MG PO TABS
650.0000 mg | ORAL_TABLET | ORAL | Status: DC | PRN
  Administered 2015-01-10: 650 mg via ORAL
  Filled 2015-01-10: qty 2

## 2015-01-10 MED ORDER — FENTANYL CITRATE (PF) 100 MCG/2ML IJ SOLN
INTRAMUSCULAR | Status: DC | PRN
  Administered 2015-01-10 (×5): 50 ug via INTRAVENOUS

## 2015-01-10 MED ORDER — LACTATED RINGERS IV SOLN
INTRAVENOUS | Status: DC
  Administered 2015-01-10: 1000 mL via INTRAVENOUS
  Administered 2015-01-10: 13:00:00 via INTRAVENOUS

## 2015-01-10 MED ORDER — FENTANYL CITRATE (PF) 100 MCG/2ML IJ SOLN: 25.0000 ug | INTRAMUSCULAR | Status: DC | PRN

## 2015-01-10 MED ORDER — ROCURONIUM BROMIDE 100 MG/10ML IV SOLN
INTRAVENOUS | Status: DC | PRN
  Administered 2015-01-10: 10 mg via INTRAVENOUS
  Administered 2015-01-10: 40 mg via INTRAVENOUS

## 2015-01-10 MED ORDER — SUCCINYLCHOLINE CHLORIDE 20 MG/ML IJ SOLN
INTRAMUSCULAR | Status: DC | PRN
  Administered 2015-01-10: 100 mg via INTRAVENOUS

## 2015-01-10 MED ORDER — DEXAMETHASONE SODIUM PHOSPHATE 10 MG/ML IJ SOLN
INTRAMUSCULAR | Status: DC | PRN
  Administered 2015-01-10: 10 mg via INTRAVENOUS

## 2015-01-10 MED ORDER — ROCURONIUM BROMIDE 100 MG/10ML IV SOLN
INTRAVENOUS | Status: AC
  Filled 2015-01-10: qty 1

## 2015-01-10 MED ORDER — CEFAZOLIN SODIUM-DEXTROSE 2-3 GM-% IV SOLR
INTRAVENOUS | Status: AC
  Filled 2015-01-10: qty 50

## 2015-01-10 MED ORDER — SUGAMMADEX SODIUM 200 MG/2ML IV SOLN
INTRAVENOUS | Status: DC | PRN
  Administered 2015-01-10: 200 mg via INTRAVENOUS

## 2015-01-10 MED ORDER — PROMETHAZINE HCL 25 MG/ML IJ SOLN
6.2500 mg | INTRAMUSCULAR | Status: DC | PRN
  Administered 2015-01-10: 6.25 mg via INTRAVENOUS
  Filled 2015-01-10: qty 1

## 2015-01-10 MED ORDER — CEFAZOLIN SODIUM-DEXTROSE 2-3 GM-% IV SOLR
2.0000 g | INTRAVENOUS | Status: AC
  Administered 2015-01-10: 2 g via INTRAVENOUS

## 2015-01-10 MED ORDER — EPHEDRINE SULFATE 50 MG/ML IJ SOLN
INTRAMUSCULAR | Status: DC | PRN
  Administered 2015-01-10: 5 mg via INTRAVENOUS

## 2015-01-10 MED ORDER — SUGAMMADEX SODIUM 200 MG/2ML IV SOLN
INTRAVENOUS | Status: AC
  Filled 2015-01-10: qty 2

## 2015-01-10 MED ORDER — ACETAMINOPHEN 650 MG RE SUPP
650.0000 mg | RECTAL | Status: DC | PRN
  Filled 2015-01-10: qty 1

## 2015-01-10 MED ORDER — ONDANSETRON HCL 4 MG/2ML IJ SOLN
INTRAMUSCULAR | Status: DC | PRN
  Administered 2015-01-10: 4 mg via INTRAVENOUS

## 2015-01-10 SURGICAL SUPPLY — 78 items
ADH SKN CLS APL DERMABOND .7 (GAUZE/BANDAGES/DRESSINGS) ×2
APL SKNCLS STERI-STRIP NONHPOA (GAUZE/BANDAGES/DRESSINGS)
APPLIER CLIP 5 13 M/L LIGAMAX5 (MISCELLANEOUS)
APPLIER CLIP ROT 10 11.4 M/L (STAPLE)
APR CLP MED LRG 11.4X10 (STAPLE)
APR CLP MED LRG 5 ANG JAW (MISCELLANEOUS)
BENZOIN TINCTURE PRP APPL 2/3 (GAUZE/BANDAGES/DRESSINGS) ×2 IMPLANT
BLADE EXTENDED COATED 6.5IN (ELECTRODE) IMPLANT
BLADE SURG SZ10 CARB STEEL (BLADE) IMPLANT
CELLS DAT CNTRL 66122 CELL SVR (MISCELLANEOUS) IMPLANT
CHLORAPREP W/TINT 26ML (MISCELLANEOUS) ×4 IMPLANT
CLIP APPLIE 5 13 M/L LIGAMAX5 (MISCELLANEOUS) IMPLANT
CLIP APPLIE ROT 10 11.4 M/L (STAPLE) IMPLANT
CLOSURE WOUND 1/2 X4 (GAUZE/BANDAGES/DRESSINGS)
COVER MAYO STAND STRL (DRAPES) IMPLANT
COVER SURGICAL LIGHT HANDLE (MISCELLANEOUS) ×4 IMPLANT
DECANTER SPIKE VIAL GLASS SM (MISCELLANEOUS) ×4 IMPLANT
DERMABOND ADVANCED (GAUZE/BANDAGES/DRESSINGS) ×2
DERMABOND ADVANCED .7 DNX12 (GAUZE/BANDAGES/DRESSINGS) IMPLANT
DRAIN PENROSE 18X1/2 LTX STRL (DRAIN) IMPLANT
DRAPE LAPAROSCOPIC ABDOMINAL (DRAPES) ×4 IMPLANT
DRAPE LG THREE QUARTER DISP (DRAPES) IMPLANT
DRAPE PED LAPAROTOMY (DRAPES) ×4 IMPLANT
DRAPE UTILITY XL STRL (DRAPES) ×4 IMPLANT
DRAPE WARM FLUID 44X44 (DRAPE) IMPLANT
DRSG TEGADERM 4X4.75 (GAUZE/BANDAGES/DRESSINGS) ×4 IMPLANT
ELECT PENCIL ROCKER SW 15FT (MISCELLANEOUS) ×2 IMPLANT
ELECT REM PT RETURN 9FT ADLT (ELECTROSURGICAL) ×4
ELECTRODE REM PT RTRN 9FT ADLT (ELECTROSURGICAL) ×2 IMPLANT
GAUZE SPONGE 4X4 12PLY STRL (GAUZE/BANDAGES/DRESSINGS) ×4 IMPLANT
GLOVE BIO SURGEON STRL SZ7.5 (GLOVE) ×4 IMPLANT
GLOVE BIOGEL M STRL SZ7.5 (GLOVE) ×4 IMPLANT
GLOVE INDICATOR 8.0 STRL GRN (GLOVE) ×4 IMPLANT
GOWN STRL REUS W/TWL LRG LVL3 (GOWN DISPOSABLE) ×4 IMPLANT
GOWN STRL REUS W/TWL XL LVL3 (GOWN DISPOSABLE) ×16 IMPLANT
HANDLE SUCTION POOLE (INSTRUMENTS) IMPLANT
KIT BASIN OR (CUSTOM PROCEDURE TRAY) ×4 IMPLANT
LEGGING LITHOTOMY PAIR STRL (DRAPES) IMPLANT
LIGASURE IMPACT 36 18CM CVD LR (INSTRUMENTS) IMPLANT
MESH ULTRAPRO 3X6 7.6X15CM (Mesh General) ×2 IMPLANT
NEEDLE HYPO 22GX1.5 SAFETY (NEEDLE) ×2 IMPLANT
PACK GENERAL/GYN (CUSTOM PROCEDURE TRAY) ×2 IMPLANT
RETRACTOR WND ALEXIS 18 MED (MISCELLANEOUS) IMPLANT
RTRCTR WOUND ALEXIS 18CM MED (MISCELLANEOUS)
SCISSORS LAP 5X35 DISP (ENDOMECHANICALS) ×2 IMPLANT
SET IRRIG TUBING LAPAROSCOPIC (IRRIGATION / IRRIGATOR) IMPLANT
SHEARS HARMONIC ACE PLUS 36CM (ENDOMECHANICALS) IMPLANT
SLEEVE XCEL OPT CAN 5 100 (ENDOMECHANICALS) ×2 IMPLANT
SPONGE LAP 18X18 X RAY DECT (DISPOSABLE) IMPLANT
STAPLER VISISTAT 35W (STAPLE) IMPLANT
STRIP CLOSURE SKIN 1/2X4 (GAUZE/BANDAGES/DRESSINGS) ×2 IMPLANT
SUCTION POOLE HANDLE (INSTRUMENTS)
SUT MNCRL AB 4-0 PS2 18 (SUTURE) ×4 IMPLANT
SUT PDS AB 1 TP1 96 (SUTURE) IMPLANT
SUT PROLENE 2 0 CT2 30 (SUTURE) ×12 IMPLANT
SUT PROLENE 2 0 KS (SUTURE) IMPLANT
SUT PROLENE 2 0 SH DA (SUTURE) IMPLANT
SUT SILK 2 0 (SUTURE)
SUT SILK 2 0 SH CR/8 (SUTURE) IMPLANT
SUT SILK 2-0 18XBRD TIE 12 (SUTURE) IMPLANT
SUT SILK 3 0 (SUTURE)
SUT SILK 3 0 SH CR/8 (SUTURE) IMPLANT
SUT SILK 3-0 18XBRD TIE 12 (SUTURE) IMPLANT
SUT VIC AB 2-0 SH 27 (SUTURE) ×4
SUT VIC AB 2-0 SH 27X BRD (SUTURE) ×2 IMPLANT
SUT VIC AB 3-0 SH 18 (SUTURE) ×4 IMPLANT
SYR BULB IRRIGATION 50ML (SYRINGE) ×2 IMPLANT
SYR CONTROL 10ML LL (SYRINGE) ×2 IMPLANT
SYS LAPSCP GELPORT 120MM (MISCELLANEOUS)
SYSTEM LAPSCP GELPORT 120MM (MISCELLANEOUS) IMPLANT
TOWEL OR 17X26 10 PK STRL BLUE (TOWEL DISPOSABLE) ×4 IMPLANT
TRAY FOLEY W/METER SILVER 14FR (SET/KITS/TRAYS/PACK) ×2 IMPLANT
TRAY FOLEY W/METER SILVER 16FR (SET/KITS/TRAYS/PACK) ×2 IMPLANT
TRAY LAPAROSCOPIC (CUSTOM PROCEDURE TRAY) ×4 IMPLANT
TROCAR BLADELESS OPT 5 100 (ENDOMECHANICALS) ×2 IMPLANT
TROCAR XCEL NON-BLD 11X100MML (ENDOMECHANICALS) IMPLANT
YANKAUER SUCT BULB TIP 10FT TU (MISCELLANEOUS) ×2 IMPLANT
YANKAUER SUCT BULB TIP NO VENT (SUCTIONS) IMPLANT

## 2015-01-10 NOTE — Anesthesia Procedure Notes (Signed)
Procedure Name: Intubation Date/Time: 01/10/2015 11:12 AM Performed by: Montel Clock Pre-anesthesia Checklist: Patient identified, Emergency Drugs available, Suction available, Patient being monitored and Timeout performed Patient Re-evaluated:Patient Re-evaluated prior to inductionOxygen Delivery Method: Circle system utilized Preoxygenation: Pre-oxygenation with 100% oxygen Intubation Type: IV induction Ventilation: Mask ventilation without difficulty and Oral airway inserted - appropriate to patient size Laryngoscope Size: Mac and 3 Grade View: Grade II Tube type: Oral Tube size: 7.5 mm Number of attempts: 1 Airway Equipment and Method: Stylet and LTA kit utilized Placement Confirmation: ETT inserted through vocal cords under direct vision,  positive ETCO2 and breath sounds checked- equal and bilateral Secured at: 21 cm Tube secured with: Tape Dental Injury: Teeth and Oropharynx as per pre-operative assessment  Comments: Poor grade II view. ETT easily passed with cricoid pressure to optimize view.

## 2015-01-10 NOTE — Anesthesia Preprocedure Evaluation (Signed)
Anesthesia Evaluation  Patient identified by MRN, date of birth, ID band Patient awake    Reviewed: Allergy & Precautions, NPO status , Patient's Chart, lab work & pertinent test results  Airway Mallampati: II  TM Distance: >3 FB Neck ROM: Full    Dental no notable dental hx.    Pulmonary former smoker,    Pulmonary exam normal breath sounds clear to auscultation       Cardiovascular Exercise Tolerance: Good hypertension, Pt. on medications Normal cardiovascular exam Rhythm:Regular Rate:Normal     Neuro/Psych  Neuromuscular disease negative psych ROS   GI/Hepatic Neg liver ROS, hiatal hernia, GERD  Medicated,  Endo/Other  negative endocrine ROS  Renal/GU negative Renal ROS  negative genitourinary   Musculoskeletal negative musculoskeletal ROS (+)   Abdominal   Peds negative pediatric ROS (+)  Hematology negative hematology ROS (+)   Anesthesia Other Findings   Reproductive/Obstetrics negative OB ROS                             Anesthesia Physical Anesthesia Plan  ASA: II  Anesthesia Plan: General   Post-op Pain Management:    Induction: Intravenous  Airway Management Planned: Oral ETT  Additional Equipment:   Intra-op Plan:   Post-operative Plan: Extubation in OR  Informed Consent: I have reviewed the patients History and Physical, chart, labs and discussed the procedure including the risks, benefits and alternatives for the proposed anesthesia with the patient or authorized representative who has indicated his/her understanding and acceptance.   Dental advisory given  Plan Discussed with: CRNA  Anesthesia Plan Comments:         Anesthesia Quick Evaluation

## 2015-01-10 NOTE — Anesthesia Postprocedure Evaluation (Signed)
Anesthesia Post Note  Patient: Mandy Delacruz  Procedure(s) Performed: Procedure(s) (LRB): LAPAROSCOPY DIAGNOSTIC (N/A) OPEN REPAIR OF RECURRENT LEFT INGUINAL HERNIA  (Left) INSERTION OF MESH (Left)  Patient location during evaluation: PACU Anesthesia Type: General Level of consciousness: awake and alert Pain management: pain level controlled Vital Signs Assessment: post-procedure vital signs reviewed and stable Respiratory status: spontaneous breathing, nonlabored ventilation, respiratory function stable and patient connected to nasal cannula oxygen Cardiovascular status: blood pressure returned to baseline and stable Postop Assessment: no signs of nausea or vomiting Anesthetic complications: no    Last Vitals:  Filed Vitals:   01/10/15 1345 01/10/15 1401  BP: 148/64 144/58  Pulse: 82 75  Temp: 36.4 C 36.3 C  Resp: 16     Last Pain:  Filed Vitals:   01/10/15 1522  PainSc: 5                  Eliz Nigg J

## 2015-01-10 NOTE — Op Note (Signed)
Mandy Delacruz March 21, 1945 [id number] 01/10/2015   Open Repair of Recurrent Left Inguinal Hernia with Mesh, diagnostic laparoscopy Procedure Note  Indications: The patient presented with a history of a recurrent left, reducible hernia.    Pre-operative Diagnosis: recurrent left reducible inguinal hernia  Post-operative Diagnosis: recurrent left indirect inguinal hernia  Surgeon: [name]   Assistants: none  Anesthesia: General endotracheal anesthesia  ASA Class: 3   Surgeon: Leighton Ruff. Redmond Pulling, MD, FACS  Procedure Details  The patient was seen again in the Holding Room. The risks, benefits, complications, treatment options, and expected outcomes were discussed with the patient. The possibilities of reaction to medication, pulmonary aspiration, perforation of viscus, bleeding, recurrent infection, the need for additional procedures, and development of a complication requiring transfusion or further operation were discussed with the patient and/or family. The likelihood of success in repairing the hernia and returning the patient to their previous functional status is good.  There was concurrence with the proposed plan, and informed consent was obtained. The site of surgery was properly noted/marked. The patient was taken to the Operating Room, identified as Mandy Delacruz, and the procedure verified as recurrent left inguinal hernia repair with diagnostic laparoscopy. A Time Out was held and the above information confirmed. She had previously undergone a laparoscopic repair of a left inguinal hernia with mesh.  The patient was placed in the supine position and underwent induction of anesthesia. The lower abdomen and groin was prepped with Chloraprep and draped in the standard fashion. Because this is a recurrent hernia I felt a quick laparoscopy was indicated to see if this was a direct or indirect recurrence and what was going on with the previously placed mesh. Local was infiltrated in  the infraumbilical position. I then made a small incision in the infraumbilical position through her old scar with a #11 blade. The fascia was grasped and lifted anteriorly. Next the fascia was incised and the abdominal cavity was entered. A pursestring suture of 0 Vicryl on a UR 6 needle was placed from the fascial edges. A 12 mm Hassan trocar was placed and pneumoperitoneum was smoothly established to a patient pressure of 15 mmHg. The laparoscope was advanced and the abdominal cavity was surveilled. There is no evidence of injury to surrounding structures. The patient was placed in Trendelenburg position. I visualize the left groin. There was a recurrent left inguinal hernia. The mesh was flush against the abdominal wall in the preperitoneal space; however it had slid up exposing the inguinal canal. There was nothing trapped within the inguinal canal. At this point pneumoperitoneum was released and hasson trocar was removed. 0.25% Marcaine with epinephrine was used to anesthetize the skin over the mid-portion of the inguinal canal. An oblique incision was made. Dissection was carried down through the subcutaneous tissue with cautery to the external oblique fascia.  We opened the external oblique fascia along the direction of its fibers to the external ring.  The hernia sac was circumferentially dissected bluntly and lifted off from the inguinal floor and surrounding tissues..  The floor of the inguinal canal was inspected and intact.  The hernia sac was reduced back into the abdominal cavity. I imbricated the inguinal floor muscle to the shelving edge of the inguinal ligament thus closing up the hernia space with 3 interrupted 2-0 Prolene sutures.  We used a 3 x 6 inch piece of Ultrapro mesh.  This was secured with 2-0 Prolene, beginning at the pubic tubercle, running this along the shelving edge  inferiorly. Superiorly, the mesh was secured to the internal oblique fascia with interrupted 2-0 Prolene sutures.  The  mesh was tucked underneath the external oblique fascia laterally.  The external oblique fascia was reapproximated with 2-0 Vicryl.  3-0 Vicryl was used to close the subcutaneous tissues and 4-0 Monocryl was used to close the skin in subcuticular fashion.  Dermabond was used to seal the incision and the infraumbilical incision.    The patient was then extubated and brought to the recovery room in stable condition.  All sponge, instrument, and needle counts were correct prior to closure and at the conclusion of the case.   Estimated Blood Loss: Minimal                 Complications: None; patient tolerated the procedure well.         Disposition: PACU - hemodynamically stable.         Condition: stable  Leighton Ruff. Redmond Pulling, MD, FACS General, Bariatric, & Minimally Invasive Surgery [facility], [place]

## 2015-01-10 NOTE — Progress Notes (Signed)
Patient up to ambulate in hall way with one person assist. Tolerated well.

## 2015-01-10 NOTE — Transfer of Care (Signed)
Immediate Anesthesia Transfer of Care Note  Patient: Mandy Delacruz  Procedure(s) Performed: Procedure(s): LAPAROSCOPY DIAGNOSTIC (N/A) OPEN REPAIR OF RECURRENT LEFT INGUINAL HERNIA  (Left) INSERTION OF MESH (Left)  Patient Location: PACU  Anesthesia Type:General  Level of Consciousness:  sedated, patient cooperative and responds to stimulation  Airway & Oxygen Therapy:Patient Spontanous Breathing and Patient connected to face mask oxgen  Post-op Assessment:  Report given to PACU RN and Post -op Vital signs reviewed and stable  Post vital signs:  Reviewed and stable  Last Vitals:  Filed Vitals:   01/10/15 0744 01/10/15 1255  BP: 173/76 158/64  Pulse: 71   Temp: 36.7 C 36.7 C  Resp: 16 19    Complications: No apparent anesthesia complications

## 2015-01-10 NOTE — Progress Notes (Signed)
Patient has had recent bronchitis but states she is feeling much better. Not coughing as much.

## 2015-01-10 NOTE — Discharge Instructions (Signed)
[facility]  UMBILICAL OR INGUINAL HERNIA REPAIR: POST OP INSTRUCTIONS  Always review your discharge instruction sheet given to you by the facility where your surgery was performed. IF YOU HAVE DISABILITY OR FAMILY LEAVE FORMS, YOU MUST BRING THEM TO THE OFFICE FOR PROCESSING.   DO NOT GIVE THEM TO YOUR DOCTOR.  1. A  prescription for pain medication may be given to you upon discharge.  Take your pain medication as prescribed, if needed.  If narcotic pain medicine is not needed, then you may take acetaminophen (Tylenol) or ibuprofen (Advil) as needed. 2. Take your usually prescribed medications unless otherwise directed. 3. If you need a refill on your pain medication, please contact your pharmacy.  They will contact our office to request authorization. Prescriptions will not be filled after 5 pm or on week-ends. 4. You should follow a light diet the first 24 hours after arrival home, such as soup and crackers, etc.  Be sure to include lots of fluids daily.  Resume your normal diet the day after surgery. 5. Most patients will experience some swelling and bruising around the umbilicus or in the groin and scrotum.  Ice packs and reclining will help.  Swelling and bruising can take several days to resolve.  6. It is common to experience some constipation if taking pain medication after surgery.  Increasing fluid intake and taking a stool softener (such as Colace) will usually help or prevent this problem from occurring.  A mild laxative (Milk of Magnesia or Miralax) should be taken according to package directions if there are no bowel movements after 48 hours. 7.   If your surgeon used skin glue on the incision, you may shower in 24 hours.  The glue will flake off over the next 2-3 weeks.  Any sutures or staples will be removed at the office during your follow-up visit. 8. ACTIVITIES:  You may resume regular (light) daily activities beginning the next day--such as daily self-care,  walking, climbing stairs--gradually increasing activities as tolerated.  You may have sexual intercourse when it is comfortable.  Refrain from any heavy lifting or straining until approved by your doctor. a. You may drive when you are no longer taking prescription pain medication, you can comfortably wear a seatbelt, and you can safely maneuver your car and apply brakes. b. RETURN TO WORK:  9. You should see your doctor in the office for a follow-up appointment approximately 2-3 weeks after your surgery.  Make sure that you call for this appointment within a day or two after you arrive home to insure a convenient appointment time. 10. OTHER INSTRUCTIONS: DO NOT LIFT, PUSH, OR PULL ANYTHING GREATER THAN 10 POUNDS FOR 6 WEEKS    WHEN TO CALL YOUR DOCTOR: 1. Fever over 101.0 2. Inability to urinate 3. Nausea and/or vomiting 4. Extreme swelling or bruising 5. Continued bleeding from incision. 6. Increased pain, redness, or drainage from the incision  The clinic staff is available to answer your questions during regular business hours.  Please dont hesitate to call and ask to speak to one of the nurses for clinical concerns.  If you have a medical emergency, go to the nearest emergency room or call 911.  A surgeon from [facility] is always on call at the hospital   [address], [place], [place], [place]  [address] ?  [address], [place], [place]   [address] [phone] ? [phone] ? FAX [phone] Web site: [email or web address]  General Anesthesia, Adult, Care After Refer to this sheet in the next few weeks. These instructions provide you with information on caring for yourself after your procedure. Your health care provider may also give you more specific instructions. Your treatment has been planned according to current medical practices, but problems sometimes occur. Call your health care provider if you have any problems or questions after your  procedure. WHAT TO EXPECT AFTER THE PROCEDURE After the procedure, it is typical to experience:  Sleepiness.  Nausea and vomiting. HOME CARE INSTRUCTIONS  For the first 24 hours after general anesthesia:  Have a responsible person with you.  Do not drive a car. If you are alone, do not take public transportation.  Do not drink alcohol.  Do not take medicine that has not been prescribed by your health care provider.  Do not sign important papers or make important decisions.  You may resume a normal diet and activities as directed by your health care provider.  Change bandages (dressings) as directed.  If you have questions or problems that seem related to general anesthesia, call the hospital and ask for the anesthetist or anesthesiologist on call. SEEK MEDICAL CARE IF:  You have nausea and vomiting that continue the day after anesthesia.  You develop a rash. SEEK IMMEDIATE MEDICAL CARE IF:   You have difficulty breathing.  You have chest pain.  You have any allergic problems.   This information is not intended to replace advice given to you by your health care provider. Make sure you discuss any questions you have with your health care provider.   Document Released: 04/07/2000 Document Revised: 01/20/2014 Document Reviewed: 04/30/2011 Elsevier Interactive Patient Education [employer].

## 2015-01-10 NOTE — H&P (View-Only) (Signed)
Mandy Delacruz 12/13/2014 8:37 AM Location: [facility] Patient #: [id number] DOB: 1945/05/20 Married / Language: English / Race: White Female   History of Present Illness [name] M. Maimuna Leaman MD; 12/13/2014 9:17 AM) The patient is a 69 year old female who presents for an evaluation of a hernia. She comes in for long-term follow-up. She underwent laparoscopic repair of a large left indirect hernia with mesh on October 25, 2013. I last saw her in November of last year. At that time she had what was felt to be a seroma. We performed a CT scan which did not really demonstrate a recurrent inguinal hernia however there was some asymmetry into the left groin compared to the right groin. She states that she is been having ongoing issues with her seroma. She states that about once a week the seroma will swell and get hard and she will also have burning in her left groin that'll radiate into her hairline and a little bit into her left medial thigh. The burning will last for about a day and go away. She denies any nausea, vomiting, constipation. She denies any new medical issues since she was last seen. She denies any trips to the emergency room. She continues to smoke. She smokes up to 1 pack per day. She denies any chest pain, chest pressure, TIAs or amaurosis fugax.   Problem List/Past Medical [name] Ronnie Derby, MD; 12/13/2014 9:19 AM) TOBACCO DEPENDENCE (F17.200) RECURRENT LEFT INGUINAL HERNIA (K40.91) LEFT GROIN HERNIA (K40.90) STATUS POST LAPAROSCOPIC HERNIA REPAIR [id number])  Other Problems [name], MD; 12/13/2014 9:19 AM) Inguinal Hernia Hypercholesterolemia Lump In Breast Diverticulosis Back Pain High blood pressure Gastroesophageal Reflux Disease  Past Surgical History [name], MD; 12/13/2014 9:19 AM) Laparoscopic Inguinal Hernia Surgery Bilateral. Gallbladder Surgery - Laparoscopic Tonsillectomy Colon Polyp Removal - Colonoscopy Breast  Biopsy Left.  Diagnostic Studies History [name], MD; 12/13/2014 9:19 AM) Mammogram >3 years ago Colonoscopy 1-5 years ago Pap Smear 1-5 years ago  Allergies [name], MD; 12/13/2014 9:19 AM) Codeine Sulfate *ANALGESICS - OPIOID* Codeine/Codeine Derivatives  Medication History [name], MD; 12/13/2014 9:19 AM) Losartan Potassium-HCTZ (50-12.5MG  Tablet, Oral) Active. PriLOSEC (20MG  Capsule DR, Oral) Active. Ibuprofen (200MG  Capsule, Oral) Active. Dextromethorphan-Guaifenesin (15-200MG  Tablet, Oral) Active. Omnicef (300MG  Capsule, Oral) Active. Tessalon Perles (100MG  Capsule, Oral) Active. Fosamax (10MG  Tablet, Oral) Active. Tylenol (325MG  Tablet, Oral) Active. Medications Reconciled OxyCODONE HCl (5MG  Capsule, 1-2 Capsule Oral q 4 hours prn pain, Taken starting 10/25/2013) Active.  Social History [name], MD; 12/13/2014 9:19 AM) Tobacco use Current every day smoker. No drug use Caffeine use Carbonated beverages, Tea. Alcohol use Occasional alcohol use.  Family History [name], MD; 12/13/2014 9:19 AM) Alcohol Abuse Mother. Cancer Mother. Thyroid problems Mother. Heart Disease Mother. Depression Mother. Respiratory Condition Father, Mother. Hypertension Father, Mother.  Pregnancy / Birth History [name], MD; 12/13/2014 9:19 AM) Age of menopause 37-55 Age at menarche 35 years. Contraceptive History Contraceptive implant, Oral contraceptives. Maternal age 81-25 Gravida 2 Para 2    Review of Systems [name] M. Ryland Smoots MD; 12/13/2014 9:14 AM) General Not Present- Appetite Loss, Chills, Fatigue, Fever, Night Sweats, Weight Gain and Weight Loss. Skin Not Present- Change in Wart/Mole, Dryness, Hives, Jaundice, New Lesions, Non-Healing Wounds, Rash and Ulcer. HEENT Present- Wears glasses/contact lenses. Not Present- Earache, Hearing Loss, Hoarseness, Nose Bleed, Oral Ulcers, Ringing in the Ears, Seasonal  Allergies, Sinus Pain, Sore Throat, Visual Disturbances and Yellow Eyes. Respiratory Not Present- Bloody  sputum, Chronic Cough, Difficulty Breathing, Snoring and Wheezing. Breast Not Present- Breast Mass, Breast Pain, Nipple Discharge and Skin Changes. Cardiovascular Not Present- Chest Pain, Difficulty Breathing Lying Down, Leg Cramps, Palpitations, Rapid Heart Rate, Shortness of Breath and Swelling of Extremities. Gastrointestinal Not Present- Abdominal Pain, Bloating, Bloody Stool, Change in Bowel Habits, Chronic diarrhea, Constipation, Difficulty Swallowing, Excessive gas, Gets full quickly at meals, Hemorrhoids, Indigestion, Nausea, Rectal Pain and Vomiting. Musculoskeletal Not Present- Back Pain, Joint Pain, Joint Stiffness, Muscle Pain, Muscle Weakness and Swelling of Extremities. Neurological Not Present- Decreased Memory, Fainting, Headaches, Numbness, Seizures, Tingling, Tremor, Trouble walking and Weakness. Psychiatric Not Present- Anxiety, Bipolar, Change in Sleep Pattern, Depression, Fearful and Frequent crying. Endocrine Not Present- Cold Intolerance, Excessive Hunger, Hair Changes, Heat Intolerance, Hot flashes and New Diabetes. Hematology Not Present- Easy Bruising, Excessive bleeding, Gland problems, HIV and Persistent Infections.  Vitals [name] CMA; 12/13/2014 8:40 AM) 12/13/2014 8:40 AM Weight: 155 lb Height: 66in Body Surface Area: 1.79 m Body Mass Index: 25.02 kg/m  Temp.: 97.26F(Temporal)  Pulse: 82 (Regular)  BP: 128/72 (Sitting, Left Arm, Standard)       Physical Exam [name] M. Acheron Sugg MD; 12/13/2014 9:14 AM) General Mental Status-Alert. General Appearance-Consistent with stated age. Hydration-Well hydrated. Voice-Normal.  Head and Neck Head-normocephalic, atraumatic with no lesions or palpable masses. Trachea-midline. Thyroid Gland Characteristics - normal size and consistency.  Eye Eyeball - Bilateral-Extraocular movements  intact. Sclera/Conjunctiva - Bilateral-No scleral icterus.  Chest and Lung Exam Chest and lung exam reveals -quiet, even and easy respiratory effort with no use of accessory muscles and on auscultation, normal breath sounds, no adventitious sounds and normal vocal resonance. Inspection Chest Wall - Normal. Back - normal.  Breast - Did not examine.  Cardiovascular Cardiovascular examination reveals -normal heart sounds, regular rate and rhythm with no murmurs and normal pedal pulses bilaterally.  Abdomen Inspection Inspection of the abdomen reveals - No Hernias. Skin - Scar - Note: old trocar scars. Palpation/Percussion Palpation and Percussion of the abdomen reveal - Soft, Non Tender, No Rebound tenderness, No Rigidity (guarding) and No hepatosplenomegaly. Auscultation Auscultation of the abdomen reveals - Bowel sounds normal.  Female Genitourinary Note: obvious large bulge in left groin, reducible, nontender; chaperone present   Peripheral Vascular Upper Extremity Palpation - Pulses bilaterally normal.  Neurologic Neurologic evaluation reveals -alert and oriented x 3 with no impairment of recent or remote memory. Mental Status-Normal.  Neuropsychiatric The patient's mood and affect are described as -normal. Judgment and Insight-insight is appropriate concerning matters relevant to self.  Musculoskeletal Normal Exam - Left-Upper Extremity Strength Normal and Lower Extremity Strength Normal. Normal Exam - Right-Upper Extremity Strength Normal and Lower Extremity Strength Normal.  Lymphatic Head & Neck  General Head & Neck Lymphatics: Bilateral - Description - Normal. Axillary - Did not examine. Femoral & Inguinal - Did not examine.    Assessment & Plan [name] M. Tamim Skog MD; 12/13/2014 9:19 AM) RECURRENT LEFT INGUINAL HERNIA (K40.91) Impression: Unfortunately it appears that she has a recurrence of her inguinal hernia. It also appears that she is  having possible intermittent episodes of incarceration. We discussed signs and symptoms of incarceration and strangulation. At this point I think nonoperative management is not even an option. We discussed that this needs to be surgically repaired.  I have recommended a diagnostic laparoscopy with open repair of recurrent left inguinal hernia with mesh.  I described the procedure in detail. The patient was given Neurosurgeon. We discussed the risks and benefits including but not  limited to bleeding, infection, chronic inguinal pain, nerve entrapment, hernia recurrence, mesh complications, hematoma formation, urinary retention, injury to surrounding structures, numbness in the groin, blood clots, injury to the surrounding structures, and anesthesia risk. We also discussed the typical post operative recovery course, including no heavy lifting for 6 weeks. I explained that the likelihood of improvement of their symptoms is good. I did explain that with her ongoing smoking and the fat of this is a recurrent inguinal hernia her risk of recurrence is even higher than her initial repair. We discussed the importance of smoking cessation. Current Plans Pt Education - Pamphlet Given - Hernia Surgery: discussed with patient and provided information. You are being scheduled for surgery - Our schedulers will call you.  You should hear from our office's scheduling department within 5 working days about the location, date, and time of surgery. We try to make accommodations for patient's preferences in scheduling surgery, but sometimes the OR schedule or the surgeon's schedule prevents [place] from making those accommodations.  If you have not heard from our office [phone]) in 5 working days, call the office and ask for your surgeon's nurse.  If you have other questions about your diagnosis, plan, or surgery, call the office and ask for your surgeon's nurse.  TOBACCO DEPENDENCE (F17.200) Current Plans Pt  Education - Smoking: Ways to Quit: addiction  Leighton Ruff. Redmond Pulling, MD, FACS General, Bariatric, & Minimally Invasive Surgery [facility], [place]

## 2015-01-10 NOTE — Interval H&P Note (Signed)
History and Physical Interval Note:  01/10/2015 10:45 AM  Mandy Delacruz  has presented today for surgery, with the diagnosis of recurrent left inguinal hernia  The various methods of treatment have been discussed with the patient and family. After consideration of risks, benefits and other options for treatment, the patient has consented to  Procedure(s): LAPAROSCOPY DIAGNOSTIC (N/A) OPEN REPAIR OF RECURRENT LEFT INGUINAL HERNIA  (Left) INSERTION OF MESH (Left) as a surgical intervention .  The patient's history has been reviewed, patient examined, no change in status, stable for surgery.  I have reviewed the patient's chart and labs.  Questions were answered to the patient's satisfaction.   Leighton Ruff. Redmond Pulling, MD, [place], Bariatric, & Minimally Invasive Surgery [facility], [place]   [facility]

## 2015-06-05 DIAGNOSIS — I10 Essential (primary) hypertension: Secondary | ICD-10-CM | POA: Diagnosis not present

## 2015-06-05 DIAGNOSIS — T753XXA Motion sickness, initial encounter: Secondary | ICD-10-CM | POA: Diagnosis not present

## 2015-06-05 DIAGNOSIS — E7801 Familial hypercholesterolemia: Secondary | ICD-10-CM | POA: Diagnosis not present

## 2015-06-05 DIAGNOSIS — J45909 Unspecified asthma, uncomplicated: Secondary | ICD-10-CM | POA: Diagnosis not present

## 2015-07-27 DIAGNOSIS — R350 Frequency of micturition: Secondary | ICD-10-CM | POA: Diagnosis not present

## 2015-07-27 DIAGNOSIS — Z720 Tobacco use: Secondary | ICD-10-CM | POA: Diagnosis not present

## 2015-09-08 DIAGNOSIS — R05 Cough: Secondary | ICD-10-CM | POA: Diagnosis not present

## 2015-09-08 DIAGNOSIS — J181 Lobar pneumonia, unspecified organism: Secondary | ICD-10-CM | POA: Diagnosis not present

## 2015-10-09 ENCOUNTER — Ambulatory Visit
Admission: RE | Admit: 2015-10-09 | Discharge: 2015-10-09 | Disposition: A | Discharge status: Home / Self Care | Payer: BLUE CROSS/BLUE SHIELD | Source: Ambulatory Visit | Attending: Family Medicine | Admitting: Family Medicine

## 2015-10-09 ENCOUNTER — Other Ambulatory Visit: Payer: Self-pay | Admitting: Family Medicine

## 2015-10-09 DIAGNOSIS — J189 Pneumonia, unspecified organism: Secondary | ICD-10-CM

## 2015-10-10 DIAGNOSIS — Z720 Tobacco use: Secondary | ICD-10-CM | POA: Diagnosis not present

## 2015-10-10 DIAGNOSIS — B373 Candidiasis of vulva and vagina: Secondary | ICD-10-CM | POA: Diagnosis not present

## 2015-10-10 DIAGNOSIS — J449 Chronic obstructive pulmonary disease, unspecified: Secondary | ICD-10-CM | POA: Diagnosis not present

## 2015-12-19 ENCOUNTER — Encounter: Payer: Self-pay | Admitting: Vascular Surgery

## 2015-12-19 DIAGNOSIS — H2513 Age-related nuclear cataract, bilateral: Secondary | ICD-10-CM | POA: Diagnosis not present

## 2015-12-25 ENCOUNTER — Encounter: Payer: Self-pay | Admitting: Vascular Surgery

## 2015-12-25 ENCOUNTER — Ambulatory Visit (INDEPENDENT_AMBULATORY_CARE_PROVIDER_SITE_OTHER): Payer: BLUE CROSS/BLUE SHIELD | Admitting: Vascular Surgery

## 2015-12-25 VITALS — BP 134/79 | HR 74 | Temp 98.0°F | Resp 18 | Ht 66.0 in | Wt 158.3 lb

## 2015-12-25 DIAGNOSIS — I83892 Varicose veins of left lower extremities with other complications: Secondary | ICD-10-CM | POA: Insufficient documentation

## 2015-12-25 DIAGNOSIS — I83893 Varicose veins of bilateral lower extremities with other complications: Secondary | ICD-10-CM

## 2015-12-25 NOTE — Progress Notes (Signed)
Subjective:     Patient ID: Mandy Delacruz, female   DOB: August 30, 1945, 70 y.o.   MRN: [id number]  HPI This 70 year old female was evaluated for painful varicosities bilaterally left worse than right. He has no history of DVT thrombophlebitis stasis ulcers or bleeding. She does not elastic compression stockings. She develops aching and throbbing discomfort particularly in the left leg as the day progresses. He also develops swelling in the left ankle as the day progresses and to a lesser degree on the right. Her symptoms are worsening. His is currently affecting her daily living.  Past Medical History:  Diagnosis Date  . Bronchitis    on bactrin since 12-27-14  . Colon polyps 2001  . Diverticulosis of colon (without mention of hemorrhage) 2001  . Dizziness    inner ear problem at times, saw dr  [name] for  . GERD (gastroesophageal reflux disease)   . Hiatal hernia 2001  . Hypertension   . IH (inguinal hernia)    left  . Other esophagitis 2001    Social History  Substance Use Topics  . Smoking status: Former Smoker    Packs/day: 1.00    Years: 45.00    Types: Cigarettes    Quit date: 01/03/2015  . Smokeless tobacco: Never Used     Comment: stopped smoking 01/03/2015 and using Nicoret gum  . Alcohol use 0.0 oz/week     Comment: social    Family History  Problem Relation Age of Onset  . COPD Mother   . Hypertension Father   . Celiac disease      neice, Sister's daughter  . Colon cancer Neg Hx   . Esophageal cancer Neg Hx   . Rectal cancer Neg Hx   . Stomach cancer Neg Hx     Allergies  Allergen Reactions  . Codeine Nausea Only     Current Outpatient Prescriptions:  .  ibuprofen (ADVIL,MOTRIN) 200 MG tablet, Take 200 mg by mouth every 6 (six) hours as needed., Disp: , Rfl:  .  acetaminophen (TYLENOL) 500 MG tablet, Take 1,000 mg by mouth every 6 (six) hours as needed for mild pain., Disp: , Rfl:  .  Albuterol Sulfate (PROAIR HFA IN), Inhale 2 puffs into the  lungs 4 (four) times daily as needed (wheezing). , Disp: , Rfl:  .  azithromycin (ZITHROMAX) 250 MG tablet, Take 250-500 mg by mouth daily. Take 2 tablets on Day1 then 1 tablet on day 2-5.  Started 12/21, Disp: , Rfl: 0 .  diphenhydrAMINE (BENADRYL) 25 MG tablet, Take 25 mg by mouth every 6 (six) hours as needed (as need for congestion)., Disp: , Rfl:  .  GuaiFENesin (MUCINEX PO), Take 20 mLs by mouth 2 (two) times daily as needed (for congestion and cough)., Disp: , Rfl:  .  losartan-hydrochlorothiazide (HYZAAR) 50-12.5 MG tablet, Take 1 tablet by mouth daily., Disp: , Rfl: 0 .  nicotine polacrilex (NICORETTE) 4 MG gum, Take 4 mg by mouth as needed for smoking cessation., Disp: , Rfl:  .  omeprazole (PRILOSEC OTC) 20 MG tablet, Take 20 mg by mouth at bedtime. , Disp: , Rfl:  .  oxyCODONE (OXY IR/ROXICODONE) 5 MG immediate release tablet, Take 1-2 tablets (5-10 mg total) by mouth every 4 (four) hours as needed for moderate pain, severe pain or breakthrough pain. (Patient not taking: Reported on 12/25/2015), Disp: 50 tablet, Rfl: 0 .  predniSONE (DELTASONE) 20 MG tablet, Take 40 mg by mouth daily. Started 12/22 for 3 days, Disp: ,  Rfl: 0 .  Pseudoeph-Doxylamine-DM-APAP (NYQUIL PO), Take 30 mLs by mouth at bedtime as needed (for cough)., Disp: , Rfl:   Vitals:   12/25/15 1102  BP: 134/79  Pulse: 74  Resp: 18  Temp: 98 F (36.7 C)  TempSrc: Oral  SpO2: 100%  Weight: 158 lb 4.8 oz (71.8 kg)  Height: 5\' 6"  (1.676 m)    Body mass index is 25.55 kg/m.         Review of Systems Denies chest pain, dyspnea on exertion, PND, orthopnea, hemoptysis. Does have occasional wheezing. Patient has 50-pack-year history of smoking but has stopped smoking currently.    Objective:   Physical Exam BP 134/79 (BP Location: Left Arm, Patient Position: Sitting, Cuff Size: Normal)   Pulse 74   Temp 98 F (36.7 C) (Oral)   Resp 18   Ht 5\' 6"  (1.676 m)   Wt 158 lb 4.8 oz (71.8 kg)   SpO2 100%   BMI  25.55 kg/m     Gen.-alert and oriented x3 in no apparent distress HEENT normal for age Lungs no rhonchi or wheezing Cardiovascular regular rhythm no murmurs carotid pulses 3+ palpable no bruits audible Abdomen soft nontender no palpable masses Musculoskeletal free of  major deformities Skin clear -no rashes Neurologic normal Lower extremities 3+ femoral and dorsalis pedis pulses palpable bilaterally with no edema on the right 1+ on the left Bulging varicosities left posterior calf which extend anteriorly into the dorsum of the foot with early hyperpigmentation Right leg with diffuse spider and reticular veins medial thigh and calf with no hyperpigmentation or ulceration distally.  Today I performed a bedside SonoSite ultrasound exam. Both great saphenous veins are normal in appearance. The left small saphenous vein is enlarged with gross reflux supplying these painful varicosities. The right small saphenous vein also appears enlarged with reflux     Assessment:     Painful varicosities left worse than right with symptoms of pain and swelling which are affecting patient's daily living due to gross reflux left small saphenous vein History of tobacco abuse    Plan:         #1 long leg elastic compression stockings 20-30 mm gradient #2 elevate legs as much as possible #3 ibuprofen daily on a regular basis for pain #4 return in 3 months-she will have formal venous reflux exam performed prior to next visit and formal recommendation will be made at that time.

## 2016-01-22 DIAGNOSIS — Z23 Encounter for immunization: Secondary | ICD-10-CM | POA: Diagnosis not present

## 2016-01-22 DIAGNOSIS — E7800 Pure hypercholesterolemia, unspecified: Secondary | ICD-10-CM | POA: Diagnosis not present

## 2016-01-22 DIAGNOSIS — Z0000 Encounter for general adult medical examination without abnormal findings: Secondary | ICD-10-CM | POA: Diagnosis not present

## 2016-03-14 ENCOUNTER — Encounter: Payer: Self-pay | Admitting: Vascular Surgery

## 2016-03-20 ENCOUNTER — Other Ambulatory Visit: Payer: Self-pay | Admitting: *Deleted

## 2016-03-20 DIAGNOSIS — I83812 Varicose veins of left lower extremities with pain: Secondary | ICD-10-CM

## 2016-03-21 ENCOUNTER — Ambulatory Visit (HOSPITAL_COMMUNITY)
Admission: RE | Admit: 2016-03-21 | Discharge: 2016-03-21 | Disposition: A | Discharge status: Home / Self Care | Payer: BLUE CROSS/BLUE SHIELD | Source: Ambulatory Visit | Attending: Vascular Surgery | Admitting: Vascular Surgery

## 2016-03-21 DIAGNOSIS — I83812 Varicose veins of left lower extremities with pain: Secondary | ICD-10-CM | POA: Insufficient documentation

## 2016-03-25 ENCOUNTER — Encounter (HOSPITAL_COMMUNITY): Payer: BLUE CROSS/BLUE SHIELD

## 2016-03-25 ENCOUNTER — Encounter: Payer: Self-pay | Admitting: Vascular Surgery

## 2016-03-25 ENCOUNTER — Ambulatory Visit (INDEPENDENT_AMBULATORY_CARE_PROVIDER_SITE_OTHER): Payer: BLUE CROSS/BLUE SHIELD | Admitting: Vascular Surgery

## 2016-03-25 VITALS — BP 161/77 | HR 86 | Temp 97.2°F | Resp 16 | Ht 65.5 in | Wt 162.5 lb

## 2016-03-25 DIAGNOSIS — I83892 Varicose veins of left lower extremities with other complications: Secondary | ICD-10-CM | POA: Diagnosis not present

## 2016-03-25 NOTE — Progress Notes (Signed)
Subjective:     Patient ID: Mandy Delacruz, female   DOB: 07-26-1945, 71 y.o.   MRN: [id number]  HPI This 71 year old female returns for 3 month follow-up regarding her painful varicosities in the left leg. She continues to have aching throbbing and burning discomfort in the left posterior calf and on the dorsum of the left foot which worsens as the day progresses. She is tried long-leg elastic compression stockings 20-30 millimeter gradient as well as elevation and ibuprofen with no improvement. This is affecting her daily living and she would like treatment.  Past Medical History:  Diagnosis Date  . Bronchitis    on bactrin since 12-27-14  . Colon polyps 2001  . Diverticulosis of colon (without mention of hemorrhage) 2001  . Dizziness    inner ear problem at times, saw dr  [name] for  . GERD (gastroesophageal reflux disease)   . Hiatal hernia 2001  . Hypertension   . IH (inguinal hernia)    left  . Other esophagitis 2001    Social History  Substance Use Topics  . Smoking status: Former Smoker    Packs/day: 1.00    Years: 45.00    Types: Cigarettes    Quit date: 01/03/2015  . Smokeless tobacco: Never Used     Comment: stopped smoking 01/03/2015 and using Nicoret gum  . Alcohol use 0.0 oz/week     Comment: social    Family History  Problem Relation Age of Onset  . COPD Mother   . Hypertension Father   . Celiac disease      neice, Sister's daughter  . Colon cancer Neg Hx   . Esophageal cancer Neg Hx   . Rectal cancer Neg Hx   . Stomach cancer Neg Hx     Allergies  Allergen Reactions  . Codeine Nausea Only     Current Outpatient Prescriptions:  .  Albuterol Sulfate (PROAIR HFA IN), Inhale 2 puffs into the lungs 4 (four) times daily as needed (wheezing). , Disp: , Rfl:  .  diphenhydrAMINE (BENADRYL) 25 MG tablet, Take 25 mg by mouth every 6 (six) hours as needed (as need for congestion)., Disp: , Rfl:  .  ibuprofen (ADVIL,MOTRIN) 200 MG tablet, Take 200 mg  by mouth every 6 (six) hours as needed., Disp: , Rfl:  .  losartan-hydrochlorothiazide (HYZAAR) 50-12.5 MG tablet, Take 1 tablet by mouth daily., Disp: , Rfl: 0 .  omeprazole (PRILOSEC OTC) 20 MG tablet, Take 20 mg by mouth at bedtime. , Disp: , Rfl:  .  acetaminophen (TYLENOL) 500 MG tablet, Take 1,000 mg by mouth every 6 (six) hours as needed for mild pain., Disp: , Rfl:  .  azithromycin (ZITHROMAX) 250 MG tablet, Take 250-500 mg by mouth daily. Take 2 tablets on Day1 then 1 tablet on day 2-5.  Started 12/21, Disp: , Rfl: 0 .  GuaiFENesin (MUCINEX PO), Take 20 mLs by mouth 2 (two) times daily as needed (for congestion and cough)., Disp: , Rfl:  .  nicotine polacrilex (NICORETTE) 4 MG gum, Take 4 mg by mouth as needed for smoking cessation., Disp: , Rfl:  .  oxyCODONE (OXY IR/ROXICODONE) 5 MG immediate release tablet, Take 1-2 tablets (5-10 mg total) by mouth every 4 (four) hours as needed for moderate pain, severe pain or breakthrough pain. (Patient not taking: Reported on 12/25/2015), Disp: 50 tablet, Rfl: 0 .  predniSONE (DELTASONE) 20 MG tablet, Take 40 mg by mouth daily. Started 12/22 for 3 days, Disp: , Rfl:  0 .  Pseudoeph-Doxylamine-DM-APAP (NYQUIL PO), Take 30 mLs by mouth at bedtime as needed (for cough)., Disp: , Rfl:   Vitals:   03/25/16 1416 03/25/16 1419  BP: (!) 149/78 (!) 161/77  Pulse: 86   Resp: 16   Temp: 97.2 F (36.2 C)   TempSrc: Oral   Weight: 162 lb 8 oz (73.7 kg)   Height: 5' 5.5" (1.664 m)     Body mass index is 26.63 kg/m.         Review of Systems Denies hemoptysis, chest pain, dyspnea on exertion, PND, orthopnea, claudication.    Objective:   Physical Exam BP (!) 161/77 (BP Location: Left Arm, Patient Position: Sitting, Cuff Size: Normal)   Pulse 86   Temp 97.2 F (36.2 C) (Oral)   Resp 16   Ht 5' 5.5" (1.664 m)   Wt 162 lb 8 oz (73.7 kg)   BMI 26.63 kg/m   Gen. well-developed well-nourished female no apparent distress alert and oriented  3 Lungs no rhonchi or wheezing Left leg with bulging varicosities in the posterior calf and also in the lower third of the leg anteriorly extending onto the dorsum of the foot laterally. No hyperpigmentation or ulceration noted. Trace distal edema noted. 3+ dorsalis pedis pulse palpable.  Today I reviewed the formal venous reflux exam performed on 03/21/2016 in our office. This reveals gross reflux and a large left small saphenous vein supplying these painful varicosities with no DVT. There is reflux in the left great saphenous vein but it is a small caliber vein and there is no reflux at the junction.     Assessment:     Painful varicosities left leg due to reflux left small saphenous vein causing symptoms which are affecting patient's daily 11 on resistant to conservative measures including long-leg elastic compression stockings 20-30 millimeter gradient, elevation, and ibuprofen. CEAP2    Plan:     Patient needs laser ablation left small saphenous vein plus greater than 20 stab phlebectomy of painful varicosities in the posterior calf and anteriorly in the lower third of the leg onto the dorsum of the foot. We will proceed with precertification to perform this in the near future and relieve her symptoms

## 2016-04-09 ENCOUNTER — Other Ambulatory Visit: Payer: Self-pay | Admitting: *Deleted

## 2016-04-09 DIAGNOSIS — I83892 Varicose veins of left lower extremities with other complications: Secondary | ICD-10-CM

## 2016-04-29 ENCOUNTER — Ambulatory Visit (INDEPENDENT_AMBULATORY_CARE_PROVIDER_SITE_OTHER): Payer: BLUE CROSS/BLUE SHIELD | Admitting: Vascular Surgery

## 2016-04-29 ENCOUNTER — Encounter: Payer: Self-pay | Admitting: Vascular Surgery

## 2016-04-29 VITALS — BP 150/80 | HR 88 | Temp 97.6°F | Resp 16 | Ht 65.5 in | Wt 162.0 lb

## 2016-04-29 DIAGNOSIS — I83892 Varicose veins of left lower extremities with other complications: Secondary | ICD-10-CM | POA: Diagnosis not present

## 2016-04-29 HISTORY — PX: ENDOVENOUS ABLATION SAPHENOUS VEIN W/ LASER: SUR449

## 2016-04-29 NOTE — Progress Notes (Signed)
Subjective:     Patient ID: AIDYNN POLENDO, female   DOB: Nov 20, 1945, 71 y.o.   MRN: [id number]  HPI This 71 year old female had laser ablation of the left small saphenous vein plus greater than 20 stab phlebectomy of painful varicosities performed under local tumescent anesthesia. A total of 918 J of energy was utilized. She tolerated both procedures well.  Review of Systems     Objective:   Physical Exam BP (!) 150/80 (BP Location: Left Arm, Patient Position: Sitting, Cuff Size: Normal)   Pulse 88   Temp 97.6 F (36.4 C) (Oral)   Resp 16   Ht 5' 5.5" (1.664 m)   Wt 162 lb (73.5 kg)   SpO2 99%   BMI 26.55 kg/m       Assessment:     Well-tolerated laser ablation left small saphenous vein plus greater than 20 stab phlebectomy of painful varicosities performed under local tumescent anesthesia    Plan:     Return in 1 week for venous duplex exam to confirm closure left small saphenous vein

## 2016-04-29 NOTE — Progress Notes (Signed)
Laser Ablation Procedure    Date: 04/29/2016   Mandy Delacruz DOB:January 14, 1945  Consent signed: Yes    Surgeon:  Dr. Nelda Severe. Kellie Simmering  Procedure: Laser Ablation: left Small Saphenous Vein  BP (!) 150/80 (BP Location: Left Arm, Patient Position: Sitting, Cuff Size: Normal)   Pulse 88   Temp 97.6 F (36.4 C) (Oral)   Resp 16   Ht 5' 5.5" (1.664 m)   Wt 162 lb (73.5 kg)   SpO2 99%   BMI 26.55 kg/m   Tumescent Anesthesia: 375 cc 0.9% NaCl with 50 cc Bupivacaine HCL  and 15 cc 8.4% NaHCO3  Local Anesthesia: 10 cc Bupivacaine HCL and NaHCO3 (ratio 2:1)  Pulsed Mode: 15 watts, 540ms delay, 1.0 duration  Total Energy:  981 Joules            Total Pulses: 66              Total Time:  1:05     Stab Phlebectomy: >20  Sites: Calf and Ankle  Patient tolerated procedure well    Description of Procedure:  After marking the course of the secondary varicosities, the patient was placed on the operating table in the prone position, and the left leg was prepped and draped in sterile fashion.   Local anesthetic was administered and under ultrasound guidance the saphenous vein was accessed with a micro needle and guide wire; then the mirco puncture sheath was placed.  A guide wire was inserted saphenopopliteal junction , followed by a 5 french sheath.  The position of the sheath and then the laser fiber below the junction was confirmed using the ultrasound.  Tumescent anesthesia was administered along the course of the saphenous vein using ultrasound guidance. The patient was placed in Trendelenburg position and protective laser glasses were placed on patient and staff, and the laser was fired at 15 watts continuous mode advancing 1-58mm/second for a total of 981 joules.   For stab phlebectomies, local anesthetic was administered at the previously marked varicosities, and tumescent anesthesia was administered around the vessels.  Greater than 20 stab wounds were made using the tip of an 11 blade. And  using the vein hook, the phlebectomies were performed using a hemostat to avulse the varicosities.  Adequate hemostasis was achieved.     Steri strips were applied to the stab wounds and ABD pads and thigh high compression stockings were applied.  Ace wrap bandages were applied over the phlebectomy sites and at the top of the saphenopopliteal junction. Blood loss was less than 15 cc.  The patient ambulated out of the operating room having tolerated the procedure well.

## 2016-05-06 ENCOUNTER — Encounter: Payer: Self-pay | Admitting: Vascular Surgery

## 2016-05-06 ENCOUNTER — Ambulatory Visit (HOSPITAL_COMMUNITY)
Admission: RE | Admit: 2016-05-06 | Discharge: 2016-05-06 | Disposition: A | Discharge status: Home / Self Care | Payer: BLUE CROSS/BLUE SHIELD | Source: Ambulatory Visit | Attending: Vascular Surgery | Admitting: Vascular Surgery

## 2016-05-06 ENCOUNTER — Ambulatory Visit (INDEPENDENT_AMBULATORY_CARE_PROVIDER_SITE_OTHER): Payer: Self-pay | Admitting: Vascular Surgery

## 2016-05-06 VITALS — BP 129/72 | HR 80 | Temp 97.7°F | Resp 16 | Ht 65.5 in | Wt 162.0 lb

## 2016-05-06 DIAGNOSIS — I83892 Varicose veins of left lower extremities with other complications: Secondary | ICD-10-CM | POA: Diagnosis not present

## 2016-05-06 DIAGNOSIS — I868 Varicose veins of other specified sites: Secondary | ICD-10-CM

## 2016-05-06 NOTE — Progress Notes (Signed)
Subjective:     Patient ID: Mandy Delacruz, female   DOB: [birth year], 71 y.o.   MRN: [id number]  HPI This 71 year old female returns 1 week post-laser ablation left small saphenous vein plus multiple stab phlebectomy of painful varicosities. She had some mild discomfort in the mid calf region which improved with the ibuprofen which she did take regularly. She did wear her elastic compression stocking. She's had no distal edema. She denies any chest pain dyspnea on exertion PND orthopnea or hemoptysis.  Review of Systems     Objective:   Physical Exam BP 129/72 (BP Location: Left Arm, Patient Position: Sitting, Cuff Size: Normal)   Pulse 80   Temp 97.7 F (36.5 C) (Oral)   Resp 16   Ht 5' 5.5" (1.664 m)   Wt 162 lb (73.5 kg)   SpO2 96%   BMI 26.55 kg/m   Gen. well-developed well-nourished female no apparent distress alert and oriented 3 Lungs no rhonchi or wheezing Left leg with Steri-Strips in place below the knee. Stab phlebectomy sites healing nicely. No distal edema. 2+ dorsalis pedis pulse palpable. Mild discomfort to palpation over small saphenous vein.  Today I ordered a venous duplex exam the left leg which I reviewed and interpreted. There is no DVT. There is total closure of the small saphenous vein up to near the saphenous popliteal junction     Assessment:     Successful laser ablation left small saphenous vein plus multiple stab phlebectomy of painful varicosities    Plan:     Return to see me on a when necessary basis

## 2016-05-19 DIAGNOSIS — Z720 Tobacco use: Secondary | ICD-10-CM | POA: Diagnosis not present

## 2016-05-19 DIAGNOSIS — J209 Acute bronchitis, unspecified: Secondary | ICD-10-CM | POA: Diagnosis not present

## 2016-05-20 ENCOUNTER — Other Ambulatory Visit: Payer: BLUE CROSS/BLUE SHIELD | Admitting: Vascular Surgery

## 2016-05-26 ENCOUNTER — Ambulatory Visit: Payer: BLUE CROSS/BLUE SHIELD | Admitting: Vascular Surgery

## 2016-05-26 ENCOUNTER — Ambulatory Visit (HOSPITAL_COMMUNITY): Payer: BLUE CROSS/BLUE SHIELD

## 2016-07-23 DIAGNOSIS — I10 Essential (primary) hypertension: Secondary | ICD-10-CM | POA: Diagnosis not present

## 2016-07-23 DIAGNOSIS — E7800 Pure hypercholesterolemia, unspecified: Secondary | ICD-10-CM | POA: Diagnosis not present

## 2016-07-23 DIAGNOSIS — Z720 Tobacco use: Secondary | ICD-10-CM | POA: Diagnosis not present

## 2016-07-23 DIAGNOSIS — J449 Chronic obstructive pulmonary disease, unspecified: Secondary | ICD-10-CM | POA: Diagnosis not present

## 2016-11-28 DIAGNOSIS — J45909 Unspecified asthma, uncomplicated: Secondary | ICD-10-CM | POA: Diagnosis not present

## 2016-12-11 DIAGNOSIS — H2513 Age-related nuclear cataract, bilateral: Secondary | ICD-10-CM | POA: Diagnosis not present

## 2016-12-11 DIAGNOSIS — H25013 Cortical age-related cataract, bilateral: Secondary | ICD-10-CM | POA: Diagnosis not present

## 2016-12-29 DIAGNOSIS — H25011 Cortical age-related cataract, right eye: Secondary | ICD-10-CM | POA: Diagnosis not present

## 2016-12-29 DIAGNOSIS — H25811 Combined forms of age-related cataract, right eye: Secondary | ICD-10-CM | POA: Diagnosis not present

## 2016-12-29 DIAGNOSIS — H2511 Age-related nuclear cataract, right eye: Secondary | ICD-10-CM | POA: Diagnosis not present

## 2017-01-21 DIAGNOSIS — J069 Acute upper respiratory infection, unspecified: Secondary | ICD-10-CM | POA: Diagnosis not present

## 2017-02-20 DIAGNOSIS — J449 Chronic obstructive pulmonary disease, unspecified: Secondary | ICD-10-CM | POA: Diagnosis not present

## 2017-02-20 DIAGNOSIS — J45901 Unspecified asthma with (acute) exacerbation: Secondary | ICD-10-CM | POA: Diagnosis not present

## 2017-03-05 DIAGNOSIS — L720 Epidermal cyst: Secondary | ICD-10-CM | POA: Diagnosis not present

## 2017-03-06 DIAGNOSIS — E7800 Pure hypercholesterolemia, unspecified: Secondary | ICD-10-CM | POA: Diagnosis not present

## 2017-03-06 DIAGNOSIS — Z0000 Encounter for general adult medical examination without abnormal findings: Secondary | ICD-10-CM | POA: Diagnosis not present

## 2017-03-12 DIAGNOSIS — Z1283 Encounter for screening for malignant neoplasm of skin: Secondary | ICD-10-CM | POA: Diagnosis not present

## 2017-03-12 DIAGNOSIS — D225 Melanocytic nevi of trunk: Secondary | ICD-10-CM | POA: Diagnosis not present

## 2017-03-12 DIAGNOSIS — C44612 Basal cell carcinoma of skin of right upper limb, including shoulder: Secondary | ICD-10-CM | POA: Diagnosis not present

## 2017-03-12 DIAGNOSIS — C44519 Basal cell carcinoma of skin of other part of trunk: Secondary | ICD-10-CM | POA: Diagnosis not present

## 2017-04-02 DIAGNOSIS — J45909 Unspecified asthma, uncomplicated: Secondary | ICD-10-CM | POA: Diagnosis not present

## 2017-04-02 DIAGNOSIS — J449 Chronic obstructive pulmonary disease, unspecified: Secondary | ICD-10-CM | POA: Diagnosis not present

## 2017-04-16 DIAGNOSIS — L720 Epidermal cyst: Secondary | ICD-10-CM | POA: Diagnosis not present

## 2017-04-16 DIAGNOSIS — Z85828 Personal history of other malignant neoplasm of skin: Secondary | ICD-10-CM | POA: Diagnosis not present

## 2017-04-16 DIAGNOSIS — Z08 Encounter for follow-up examination after completed treatment for malignant neoplasm: Secondary | ICD-10-CM | POA: Diagnosis not present

## 2017-04-16 DIAGNOSIS — B0089 Other herpesviral infection: Secondary | ICD-10-CM | POA: Diagnosis not present

## 2017-05-05 DIAGNOSIS — F17200 Nicotine dependence, unspecified, uncomplicated: Secondary | ICD-10-CM | POA: Diagnosis not present

## 2017-05-15 ENCOUNTER — Ambulatory Visit
Admission: RE | Admit: 2017-05-15 | Discharge: 2017-05-15 | Disposition: A | Discharge status: Home / Self Care | Payer: BLUE CROSS/BLUE SHIELD | Source: Ambulatory Visit | Attending: Family Medicine | Admitting: Family Medicine

## 2017-05-15 ENCOUNTER — Other Ambulatory Visit: Payer: Self-pay | Admitting: Family Medicine

## 2017-05-15 DIAGNOSIS — J45909 Unspecified asthma, uncomplicated: Secondary | ICD-10-CM | POA: Diagnosis not present

## 2017-05-15 DIAGNOSIS — R059 Cough, unspecified: Secondary | ICD-10-CM

## 2017-05-15 DIAGNOSIS — R6883 Chills (without fever): Secondary | ICD-10-CM

## 2017-05-15 DIAGNOSIS — R05 Cough: Secondary | ICD-10-CM

## 2017-05-15 DIAGNOSIS — J449 Chronic obstructive pulmonary disease, unspecified: Secondary | ICD-10-CM | POA: Diagnosis not present

## 2017-05-19 DIAGNOSIS — F17210 Nicotine dependence, cigarettes, uncomplicated: Secondary | ICD-10-CM | POA: Diagnosis not present

## 2017-05-19 DIAGNOSIS — Z87891 Personal history of nicotine dependence: Secondary | ICD-10-CM | POA: Diagnosis not present

## 2017-05-19 DIAGNOSIS — L139 Bullous disorder, unspecified: Secondary | ICD-10-CM | POA: Diagnosis not present

## 2017-05-19 NOTE — Progress Notes (Signed)
 Subjective   HISTORY OF PRESENT ILLNESS: Mandy Delacruz is a 72 y.o. (DOB [birth year]) female.  This very pleasant 72 year old woman was referred by Dr. Shona for evaluation of possible porphyria cutanea tarda.  She has been a very heavy smoker of at least 50 pack years.  She was recently treated with at least 2 different antibiotics, most recent was doxycycline and [place] sun exposure [place] developed blisters on her thumb, right big toe and several other small areas on the hand.  Biopsy of this was performed by her dermatologist which revealed a possible diagnosis of either porphyria cutanea tarda or possible pseudoporphyria.  She was referred for that evaluation.  Since I seen her she has quit smoking altogether and was told from an x-ray on Friday of the chest that she may have a resolving pneumonia.   No history exists.    Past Medical History, Past Surgery History, Allergies, Social History, and Family History were reviewed and updated.    Review of Systems is complete and negative except as noted.  Objective   PHYSICAL EXAMINATION: Vital signs: BP (!) 157/74 (BP Location: Left arm, Patient Position: Sitting)   Pulse 64   Temp 98.2 F (36.8 C) (Oral)   Resp 18   Ht 5' 5.5 (1.664 m)   Wt 162 lb 9.6 oz (73.8 kg)   SpO2 98%   BMI 26.65 kg/m  General: No apparent distress.  She is in no acute distress.  She does not look ill.  She is not coughing presently during any part of the discussion or evaluation.  Her skin has almost completely healed except for the sloughing of the skin especially of the big toe on the right but her one ulcerated area is much much improved at the thumb level. Psychiatric: Awake, alert, and oriented to person, place, and time.    LABS: No results found for this or any previous visit (from the past 72 hour(s)).  Imaging: No results found.    Impression   1. Pseudoporphyria    2. Smoking greater than 40 pack years     Her  evaluation shows no evidence for hemochromatosis mutation, normal iron levels, essentially normal CBC and differential and a c-Met which shows a nonspecific alkaline phosphatase elevation which could well have been reactive to what transpired with her acute reaction.  Nevertheless I think she has pseudoporphyria.  This was most likely related to her doxycycline usage + exposure etc.  The other issue with his lady is her frequent pulmonary infections requiring antibiotics.  She has quit smoking for 2 weeks and needs to make this a permanent lifestyle change.  It is essential in my opinion for her to quit smoking.  She relates that her mother is a former smoker on oxygen therapy basically around-the-clock.  She is also at risk for the development of lung cancer and is a candidate for low-dose CT scanning annually through her primary care physician.  We discussed this in detail both with the patient and with her husband present and she will discuss this with her PCP when she sees her next.  If she would develop another skin rash she needs to see Dr. Shona in follow-up and me in follow-up if there is any cause for further concern.  She knows I am happy to see her at any time in the future. Plan   Mandy Delacruz returns today for follow up of pseudoporphyria with the above discussion.  She will also  follow-up with her primary care physician about low-dose CT lung cancer screening annually.  Patient voiced understanding and is in agreement with the plan.  All questions were discussed.   Portions of this note were created using voice recognition software.  Minor syntax errors, grammatical content or punctuation errors may have occurred unintentionally.  Please notify dino if changes are necessary.   Camellia GORMAN Quarry, MD 05/19/2017 / 9:06 AM

## 2017-05-21 DIAGNOSIS — M810 Age-related osteoporosis without current pathological fracture: Secondary | ICD-10-CM | POA: Diagnosis not present

## 2017-06-01 ENCOUNTER — Other Ambulatory Visit: Payer: Self-pay | Admitting: Family Medicine

## 2017-06-01 ENCOUNTER — Ambulatory Visit
Admission: RE | Admit: 2017-06-01 | Discharge: 2017-06-01 | Disposition: A | Discharge status: Home / Self Care | Payer: BLUE CROSS/BLUE SHIELD | Source: Ambulatory Visit | Attending: Family Medicine | Admitting: Family Medicine

## 2017-06-01 DIAGNOSIS — R05 Cough: Secondary | ICD-10-CM

## 2017-06-01 DIAGNOSIS — R059 Cough, unspecified: Secondary | ICD-10-CM

## 2017-06-01 DIAGNOSIS — R918 Other nonspecific abnormal finding of lung field: Secondary | ICD-10-CM | POA: Diagnosis not present

## 2017-06-03 ENCOUNTER — Other Ambulatory Visit: Payer: Self-pay | Admitting: Family Medicine

## 2017-06-03 DIAGNOSIS — R918 Other nonspecific abnormal finding of lung field: Secondary | ICD-10-CM

## 2017-06-05 ENCOUNTER — Other Ambulatory Visit: Payer: BLUE CROSS/BLUE SHIELD

## 2017-06-15 DIAGNOSIS — J302 Other seasonal allergic rhinitis: Secondary | ICD-10-CM | POA: Diagnosis not present

## 2017-06-17 ENCOUNTER — Other Ambulatory Visit: Payer: BLUE CROSS/BLUE SHIELD

## 2017-07-29 DIAGNOSIS — R1110 Vomiting, unspecified: Secondary | ICD-10-CM | POA: Diagnosis not present

## 2017-07-29 DIAGNOSIS — R197 Diarrhea, unspecified: Secondary | ICD-10-CM | POA: Diagnosis not present

## 2017-07-29 DIAGNOSIS — R82998 Other abnormal findings in urine: Secondary | ICD-10-CM | POA: Diagnosis not present

## 2017-07-29 DIAGNOSIS — R1033 Periumbilical pain: Secondary | ICD-10-CM | POA: Diagnosis not present

## 2017-08-03 DIAGNOSIS — Z79899 Other long term (current) drug therapy: Secondary | ICD-10-CM | POA: Diagnosis not present

## 2017-08-03 DIAGNOSIS — R109 Unspecified abdominal pain: Secondary | ICD-10-CM | POA: Diagnosis not present

## 2017-08-03 DIAGNOSIS — R1033 Periumbilical pain: Secondary | ICD-10-CM | POA: Diagnosis not present

## 2017-08-03 DIAGNOSIS — F17200 Nicotine dependence, unspecified, uncomplicated: Secondary | ICD-10-CM | POA: Diagnosis not present

## 2017-08-03 DIAGNOSIS — I10 Essential (primary) hypertension: Secondary | ICD-10-CM | POA: Diagnosis not present

## 2017-08-03 DIAGNOSIS — K429 Umbilical hernia without obstruction or gangrene: Secondary | ICD-10-CM | POA: Diagnosis not present

## 2017-08-03 DIAGNOSIS — R197 Diarrhea, unspecified: Secondary | ICD-10-CM | POA: Diagnosis not present

## 2017-08-03 DIAGNOSIS — Z886 Allergy status to analgesic agent status: Secondary | ICD-10-CM | POA: Diagnosis not present

## 2017-08-03 DIAGNOSIS — Z881 Allergy status to other antibiotic agents status: Secondary | ICD-10-CM | POA: Diagnosis not present

## 2017-08-03 DIAGNOSIS — R609 Edema, unspecified: Secondary | ICD-10-CM | POA: Diagnosis not present

## 2017-08-06 DIAGNOSIS — K429 Umbilical hernia without obstruction or gangrene: Secondary | ICD-10-CM | POA: Diagnosis not present

## 2017-08-12 ENCOUNTER — Ambulatory Visit: Payer: Self-pay | Admitting: General Surgery

## 2017-08-12 NOTE — H&P (View-Only) (Signed)
Mandy Delacruz Documented: 08/06/2017 4:08 PM Location: [facility] Patient #: [id number] DOB: March 17, 1945 Married / Language: Mandy Delacruz / Race: White Female   History of Present Illness Mandy Hiss M. Verlaine Embry MD; 08/06/2017 4:40 PM) The patient is a 72 year old female who presents with non-malignant abdominal pain. She comes in because of complaints of recent issues with periumbilical pain, 1-2 episodes of nausea and loose stool. I initially met her over 3 years ago when she had a left inguinal hernia. We initially repaired at laparoscopic but she had an early recurrence. She was a heavy smoker at that time. We ended up doing an open repair in December 2016. She states around July 17 she noticed a bulge as well as tenderness to palpation slightly above her umbilicus and to the left. She describes it as a shooting pain. She might had some loose stools with that as well. She went to an urgent walk-in clinic. She was diagnosed with presumed diverticulitis and started on antibiotics. No imaging was done at that point. She also had a mild positive urinalysis. She proceeded to go to the beach. She never really felt better. That Monday she had worse pain and worsening tenderness in the same spot. No fever or chills. She ended up in the emergency room at [facility]. She had some mild hypokalemia. Her urinalysis was essentially normal. They did a CT scan which showed a fat-containing umbilical hernia with edema. There is no evidence of diverticulitis. She still has some occasional soreness around the umbilicus. She stopped smoking a few months ago. She denies any chest pain, chest pressure, TIAs or amaurosis fugax. She denies any angina.   Problem List/Past Medical Mandy Hiss M. Redmond Pulling, MD; 08/06/2017 4:41 PM) POSTOP CHECK (S82)  UMBILICAL HERNIA WITHOUT OBSTRUCTION OR GANGRENE (K42.9)   Past Surgical History Mandy Hiss M. Redmond Pulling, MD; 08/06/2017 4:41 PM) Breast Biopsy   Left. Gallbladder Surgery - Laparoscopic  Colon Polyp Removal - Colonoscopy  Tonsillectomy  Laparoscopic Inguinal Hernia Surgery  Bilateral.  Diagnostic Studies History Mandy Hiss M. Redmond Pulling, MD; 08/06/2017 4:41 PM) Colonoscopy  1-5 years ago Pap Smear  1-5 years ago Mammogram  >3 years ago  Allergies Mandy Hiss M. Redmond Pulling, MD; 08/06/2017 4:41 PM) Doxycycline Hyclate *TETRACYCLINES*  Codeine Sulfate *ANALGESICS - OPIOID*  Allergies Reconciled  Codeine/Codeine Derivatives   Medication History Mandy Hiss M. Redmond Pulling, MD; 08/06/2017 4:41 PM) Ibuprofen (200MG Capsule, Oral) Active. Tylenol (500MG Capsule, Oral) Active. ProAir HFA (108 (90 Base)MCG/ACT Aerosol Soln, Inhalation) Active. Fosamax (10MG Tablet, Oral) Active. PriLOSEC (20MG Capsule DR, Oral) Active. Losartan Potassium-HCTZ (50-12.5MG Tablet, Oral) Active. Flagyl (500MG Tablet, Oral) Active. OxyCODONE HCl (5MG Capsule, 1-2 Capsule Oral q 4 hours prn pain, Taken starting 10/25/2013) Active. Nicotine Polacrilex (4MG Gum, Mouth/Throat) Active. Tessalon Perles (100MG Capsule, Oral) Active. Tylenol (325MG Tablet, Oral) Active.  Social History Mandy Hiss M. Redmond Pulling, MD; 08/06/2017 4:41 PM) Alcohol use  Occasional alcohol use. Caffeine use  Carbonated beverages, Tea. No drug use  Tobacco use  Current every day smoker.  Family History Mandy Hiss M. Redmond Pulling, MD; 08/06/2017 4:41 PM) Thyroid problems  Mother. Cancer  Mother. Depression  Mother. Heart Disease  Mother. Hypertension  Father, Mother. Respiratory Condition  Father, Mother. Alcohol Abuse  Mother.  Pregnancy / Birth History Mandy Hiss M. Redmond Pulling, MD; 08/06/2017 4:41 PM) Age at menarche  80 years. Age of menopause  74-55 Maternal age  19-25 Contraceptive History  Contraceptive implant, Oral contraceptives. Para  2 Gravida  2  Other Problems Mandy Hiss M. Redmond Pulling, MD; 08/06/2017 4:41 PM)  High blood pressure  Back Pain  Gastroesophageal Reflux Disease   Diverticulosis  Hypercholesterolemia  Inguinal Hernia  Lump In Breast     Review of Systems Mandy Hiss M. Jaeceon Michelin MD; 08/06/2017 4:38 PM) All other systems negative  Vitals (Diane Herrin RN; 08/06/2017 4:11 PM) 08/06/2017 4:10 PM Weight: 157.38 lb Height: 65in Body Surface Area: 1.79 m Body Mass Index: 26.19 kg/m  Temp.: 98.50F(Oral)  Pulse: 100 (Regular)  P.OX: 92% (Room air) BP: 154/78 (Sitting, Left Arm, Standard)       Physical Exam Mandy Hiss M. Sianne Tejada MD; 08/06/2017 4:38 PM) General Mental Status-Alert. General Appearance-Consistent with stated age. Hydration-Well hydrated. Voice-Normal.  Head and Neck Head-normocephalic, atraumatic with no lesions or palpable masses. Trachea-midline. Thyroid Gland Characteristics - normal size and consistency.  Eye Eyeball - Bilateral-Normal. Sclera/Conjunctiva - Bilateral-No scleral icterus.  ENMT Ears -Note: normal ext ears.  Mouth and Throat -Note: lips intact.   Chest and Lung Exam Chest and lung exam reveals -quiet, even and easy respiratory effort with no use of accessory muscles and on auscultation, normal breath sounds, no adventitious sounds and normal vocal resonance. Inspection Chest Wall - Normal. Back - normal.  Breast - Did not examine.  Cardiovascular Cardiovascular examination reveals -normal heart sounds, regular rate and rhythm with no murmurs and normal pedal pulses bilaterally.  Abdomen Inspection  Inspection of the abdomen reveals: Note: palpalble supraumbilical hernia, soft, reducible, defect maybe 2 cm; upper diastasis. more noticeable standing. Skin - Scar - Note: well healed trocar scars; old l groin incision. Palpation/Percussion Palpation and Percussion of the abdomen reveal - Soft, Non Tender, No Rebound tenderness, No Rigidity (guarding) and No hepatosplenomegaly. Auscultation Auscultation of the abdomen reveals - Bowel sounds normal.  Peripheral  Vascular Upper Extremity Palpation - Pulses bilaterally normal.  Neurologic Neurologic evaluation reveals -alert and oriented x 3 with no impairment of recent or remote memory. Mental Status-Normal.  Neuropsychiatric The patient's mood and affect are described as -normal. Judgment and Insight-insight is appropriate concerning matters relevant to self.  Musculoskeletal Normal Exam - Left-Upper Extremity Strength Normal and Lower Extremity Strength Normal. Normal Exam - Right-Upper Extremity Strength Normal and Lower Extremity Strength Normal.  Lymphatic Head & Neck  General Head & Neck Lymphatics: Bilateral - Description - Normal. Axillary - Did not examine. Femoral & Inguinal - Did not examine.    Assessment & Plan Mandy Hiss M. Raven Furnas MD; 8/33/3832 9:19 PM) UMBILICAL HERNIA WITHOUT OBSTRUCTION OR GANGRENE (K42.9) Impression: We discussed the etiology of ventral umbilical hernias. We discussed the signs and symptoms of incarceration and strangulation. The patient was given educational material. I also drew diagrams.  We discussed nonoperative and operative management. With respect to operative management, we discussed both open repair and laparoscopic repair. We discussed the pros and cons of each approach. I discussed the typical aftercare with each procedure and how each procedure differs.  The patient has elected to laparoscopic-assisted repair of umbilical hernia with mesh. I recommended suturing the muscle back together primarily with a mesh underlay. Because of her age I do not recommend repair of her diastases  We discussed the risk and benefits of surgery including but not limited to bleeding, infection, injury to surrounding structures, hernia recurrence, mesh complications, hematoma/seroma formation, need to convert to an open procedure, blood clot formation, urinary retention, post operative ileus, general anesthesia risk, long-term abdominal pain. We discussed that  this procedure can be quite uncomfortable and difficult to recover from based on how the mesh is secured to the abdominal wall.  We discussed the importance of avoiding heavy lifting and straining for a period of 6 weeks. Current Plans Pt Education - Pamphlet Given - Hernia Surgery: discussed with patient and provided information.  Leighton Ruff. Redmond Pulling, MD, FACS General, Bariatric, & Minimally Invasive Surgery [facility], [place]

## 2017-08-12 NOTE — Pre-Procedure Instructions (Signed)
Mandy Delacruz  08/12/2017      [id number] Lorina Rabon, [place] AT [place] [address] [place] [address] Phone: [phone] Fax: [phone]    Your procedure is scheduled on August 18, 2017.  Report to [facility] Admitting at 530 AM.  Call this number if you have problems the morning of surgery:  [phone]   Remember:  Do not eat or drink after midnight.  You may drink clear liquids until 430 AM (3 hours prior to procedure start time).  Clear liquids allowed are:                    Water, Juice (non-citric and without pulp), Carbonated beverages, Clear Tea, Black Coffee only, Plain Jell-O only, Gatorade and Plain Popsicles only    Take these medicines the morning of surgery with A SIP OF WATER  Tylenol- if needed Albuterol inhaler-if needed (bring inhaler with you) Omeprazole (prilosec) symbicort inhaler  7 days prior to surgery STOP taking any Aspirin (unless otherwise instructed by your surgeon), Aleve, Naproxen, Ibuprofen, Motrin, Advil, Goody's, BC's, all herbal medications, fish oil, and all vitamins    Do not wear jewelry, make-up or nail polish.  Do not wear lotions, powders, or perfumes, or deodorant.  Do not shave 48 hours prior to surgery.    Do not bring valuables to the hospital.  [facility] is not responsible for any belongings or valuables.  Contacts, dentures or bridgework may not be worn into surgery.  Leave your suitcase in the car.  After surgery it may be brought to your room.  For patients admitted to the hospital, discharge time will be determined by your treatment team.  Patients discharged the day of surgery will not be allowed to drive home.    [facility]- Preparing For Surgery  Before surgery, you can play an important role. Because skin is not sterile, your skin needs to be as free of germs as possible. You can reduce the number of germs on  your skin by washing with CHG (chlorahexidine gluconate) Soap before surgery.  CHG is an antiseptic cleaner which kills germs and bonds with the skin to continue killing germs even after washing.    Oral Hygiene is also important to reduce your risk of infection.  Remember - BRUSH YOUR TEETH THE MORNING OF SURGERY WITH YOUR REGULAR TOOTHPASTE  Please do not use if you have an allergy to CHG or antibacterial soaps. If your skin becomes reddened/irritated stop using the CHG.  Do not shave (including legs and underarms) for at least 48 hours prior to first CHG shower. It is OK to shave your face.  Please follow these instructions carefully.   1. Shower the NIGHT BEFORE SURGERY and the MORNING OF SURGERY with CHG.   2. If you chose to wash your hair, wash your hair first as usual with your normal shampoo.  3. After you shampoo, rinse your hair and body thoroughly to remove the shampoo.  4. Use CHG as you would any other liquid soap. You can apply CHG directly to the skin and wash gently with a scrungie or a clean washcloth.   5. Apply the CHG Soap to your body ONLY FROM THE NECK DOWN.  Do not use on open wounds or open sores. Avoid contact with your eyes, ears, mouth and genitals (private parts). Wash Face and genitals (private parts)  with your normal soap.  6. Wash thoroughly, paying special attention to the area where your surgery will be performed.  7. Thoroughly rinse your body with warm water from the neck down.  8. DO NOT shower/wash with your normal soap after using and rinsing off the CHG Soap.  9. Pat yourself dry with a CLEAN TOWEL.  10. Wear CLEAN PAJAMAS to bed the night before surgery, wear comfortable clothes the morning of surgery  11. Place CLEAN SHEETS on your bed the night of your first shower and DO NOT SLEEP WITH PETS.  Day of Surgery:  Do not apply any deodorants/lotions.  Please wear clean clothes to the hospital/surgery center.   Remember to brush your teeth  WITH YOUR REGULAR TOOTHPASTE.  Please read over the following fact sheets that you were given.

## 2017-08-12 NOTE — H&P (Signed)
Kailoni Vahle Pogue Documented: 08/06/2017 4:08 PM Location: [facility] Patient #: [id number] DOB: 21-Feb-1945 Married / Language: Cleophus Molt / Race: White Female   History of Present Illness [name] M. Mateya Torti MD; 08/06/2017 4:40 PM) The patient is a 72 year old female who presents with non-malignant abdominal pain. She comes in because of complaints of recent issues with periumbilical pain, 1-2 episodes of nausea and loose stool. I initially met her over 3 years ago when she had a left inguinal hernia. We initially repaired at laparoscopic but she had an early recurrence. She was a heavy smoker at that time. We ended up doing an open repair in December 2016. She states around July 17 she noticed a bulge as well as tenderness to palpation slightly above her umbilicus and to the left. She describes it as a shooting pain. She might had some loose stools with that as well. She went to an urgent walk-in clinic. She was diagnosed with presumed diverticulitis and started on antibiotics. No imaging was done at that point. She also had a mild positive urinalysis. She proceeded to go to the beach. She never really felt better. That Monday she had worse pain and worsening tenderness in the same spot. No fever or chills. She ended up in the emergency room at [facility]. She had some mild hypokalemia. Her urinalysis was essentially normal. They did a CT scan which showed a fat-containing umbilical hernia with edema. There is no evidence of diverticulitis. She still has some occasional soreness around the umbilicus. She stopped smoking a few months ago. She denies any chest pain, chest pressure, TIAs or amaurosis fugax. She denies any angina.   Problem List/Past Medical [name] M. Redmond Pulling, MD; 08/06/2017 4:41 PM) POSTOP CHECK (E45)  UMBILICAL HERNIA WITHOUT OBSTRUCTION OR GANGRENE (K42.9)   Past Surgical History [name] M. Redmond Pulling, MD; 08/06/2017 4:41 PM) Breast Biopsy   Left. Gallbladder Surgery - Laparoscopic  Colon Polyp Removal - Colonoscopy  Tonsillectomy  Laparoscopic Inguinal Hernia Surgery  Bilateral.  Diagnostic Studies History [name] M. Redmond Pulling, MD; 08/06/2017 4:41 PM) Colonoscopy  1-5 years ago Pap Smear  1-5 years ago Mammogram  >3 years ago  Allergies [name] M. Redmond Pulling, MD; 08/06/2017 4:41 PM) Doxycycline Hyclate *TETRACYCLINES*  Codeine Sulfate *ANALGESICS - OPIOID*  Allergies Reconciled  Codeine/Codeine Derivatives   Medication History [name] M. Redmond Pulling, MD; 08/06/2017 4:41 PM) Ibuprofen (200MG Capsule, Oral) Active. Tylenol (500MG Capsule, Oral) Active. ProAir HFA (108 (90 Base)MCG/ACT Aerosol Soln, Inhalation) Active. Fosamax (10MG Tablet, Oral) Active. PriLOSEC (20MG Capsule DR, Oral) Active. Losartan Potassium-HCTZ (50-12.5MG Tablet, Oral) Active. Flagyl (500MG Tablet, Oral) Active. OxyCODONE HCl (5MG Capsule, 1-2 Capsule Oral q 4 hours prn pain, Taken starting 10/25/2013) Active. Nicotine Polacrilex (4MG Gum, Mouth/Throat) Active. Tessalon Perles (100MG Capsule, Oral) Active. Tylenol (325MG Tablet, Oral) Active.  Social History [name] M. Redmond Pulling, MD; 08/06/2017 4:41 PM) Alcohol use  Occasional alcohol use. Caffeine use  Carbonated beverages, Tea. No drug use  Tobacco use  Current every day smoker.  Family History [name] M. Redmond Pulling, MD; 08/06/2017 4:41 PM) Thyroid problems  Mother. Cancer  Mother. Depression  Mother. Heart Disease  Mother. Hypertension  Father, Mother. Respiratory Condition  Father, Mother. Alcohol Abuse  Mother.  Pregnancy / Birth History [name] M. Redmond Pulling, MD; 08/06/2017 4:41 PM) Age at menarche  66 years. Age of menopause  36-55 Maternal age  71-25 Contraceptive History  Contraceptive implant, Oral contraceptives. Para  2 Gravida  2  Other Problems [name] M. Redmond Pulling, MD; 08/06/2017 4:41 PM)  High blood pressure  Back Pain  Gastroesophageal Reflux Disease   Diverticulosis  Hypercholesterolemia  Inguinal Hernia  Lump In Breast     Review of Systems [name] M. Seab Axel MD; 08/06/2017 4:38 PM) All other systems negative  Vitals (Diane Herrin RN; 08/06/2017 4:11 PM) 08/06/2017 4:10 PM Weight: 157.38 lb Height: 65in Body Surface Area: 1.79 m Body Mass Index: 26.19 kg/m  Temp.: 98.6F(Oral)  Pulse: 100 (Regular)  P.OX: 92% (Room air) BP: 154/78 (Sitting, Left Arm, Standard)       Physical Exam [name] M. Yasseen Salls MD; 08/06/2017 4:38 PM) General Mental Status-Alert. General Appearance-Consistent with stated age. Hydration-Well hydrated. Voice-Normal.  Head and Neck Head-normocephalic, atraumatic with no lesions or palpable masses. Trachea-midline. Thyroid Gland Characteristics - normal size and consistency.  Eye Eyeball - Bilateral-Normal. Sclera/Conjunctiva - Bilateral-No scleral icterus.  ENMT Ears -Note: normal ext ears.  Mouth and Throat -Note: lips intact.   Chest and Lung Exam Chest and lung exam reveals -quiet, even and easy respiratory effort with no use of accessory muscles and on auscultation, normal breath sounds, no adventitious sounds and normal vocal resonance. Inspection Chest Wall - Normal. Back - normal.  Breast - Did not examine.  Cardiovascular Cardiovascular examination reveals -normal heart sounds, regular rate and rhythm with no murmurs and normal pedal pulses bilaterally.  Abdomen Inspection  Inspection of the abdomen reveals: Note: palpalble supraumbilical hernia, soft, reducible, defect maybe 2 cm; upper diastasis. more noticeable standing. Skin - Scar - Note: well healed trocar scars; old l groin incision. Palpation/Percussion Palpation and Percussion of the abdomen reveal - Soft, Non Tender, No Rebound tenderness, No Rigidity (guarding) and No hepatosplenomegaly. Auscultation Auscultation of the abdomen reveals - Bowel sounds normal.  Peripheral  Vascular Upper Extremity Palpation - Pulses bilaterally normal.  Neurologic Neurologic evaluation reveals -alert and oriented x 3 with no impairment of recent or remote memory. Mental Status-Normal.  Neuropsychiatric The patient's mood and affect are described as -normal. Judgment and Insight-insight is appropriate concerning matters relevant to self.  Musculoskeletal Normal Exam - Left-Upper Extremity Strength Normal and Lower Extremity Strength Normal. Normal Exam - Right-Upper Extremity Strength Normal and Lower Extremity Strength Normal.  Lymphatic Head & Neck  General Head & Neck Lymphatics: Bilateral - Description - Normal. Axillary - Did not examine. Femoral & Inguinal - Did not examine.    Assessment & Plan [name] M. Etherine Mackowiak MD; 2/99/3716 9:67 PM) UMBILICAL HERNIA WITHOUT OBSTRUCTION OR GANGRENE (K42.9) Impression: We discussed the etiology of ventral umbilical hernias. We discussed the signs and symptoms of incarceration and strangulation. The patient was given educational material. I also drew diagrams.  We discussed nonoperative and operative management. With respect to operative management, we discussed both open repair and laparoscopic repair. We discussed the pros and cons of each approach. I discussed the typical aftercare with each procedure and how each procedure differs.  The patient has elected to laparoscopic-assisted repair of umbilical hernia with mesh. I recommended suturing the muscle back together primarily with a mesh underlay. Because of her age I do not recommend repair of her diastases  We discussed the risk and benefits of surgery including but not limited to bleeding, infection, injury to surrounding structures, hernia recurrence, mesh complications, hematoma/seroma formation, need to convert to an open procedure, blood clot formation, urinary retention, post operative ileus, general anesthesia risk, long-term abdominal pain. We discussed that  this procedure can be quite uncomfortable and difficult to recover from based on how the mesh is secured to the abdominal wall.  We discussed the importance of avoiding heavy lifting and straining for a period of 6 weeks. Current Plans Pt Education - Pamphlet Given - Hernia Surgery: discussed with patient and provided information.  Leighton Ruff. Redmond Pulling, MD, FACS General, Bariatric, & Minimally Invasive Surgery [facility], [place]

## 2017-08-13 ENCOUNTER — Encounter (HOSPITAL_COMMUNITY)
Admission: RE | Admit: 2017-08-13 | Discharge: 2017-08-13 | Disposition: A | Discharge status: Home / Self Care | Payer: BLUE CROSS/BLUE SHIELD | Source: Ambulatory Visit | Attending: General Surgery | Admitting: General Surgery

## 2017-08-13 ENCOUNTER — Other Ambulatory Visit: Payer: Self-pay

## 2017-08-13 ENCOUNTER — Encounter (HOSPITAL_COMMUNITY): Payer: Self-pay

## 2017-08-13 DIAGNOSIS — Z01818 Encounter for other preprocedural examination: Secondary | ICD-10-CM | POA: Diagnosis not present

## 2017-08-13 DIAGNOSIS — Z01812 Encounter for preprocedural laboratory examination: Secondary | ICD-10-CM | POA: Diagnosis not present

## 2017-08-13 DIAGNOSIS — I440 Atrioventricular block, first degree: Secondary | ICD-10-CM | POA: Insufficient documentation

## 2017-08-13 DIAGNOSIS — R9431 Abnormal electrocardiogram [ECG] [EKG]: Secondary | ICD-10-CM | POA: Diagnosis not present

## 2017-08-13 HISTORY — DX: Unspecified asthma, uncomplicated: J45.909

## 2017-08-13 LAB — COMPREHENSIVE METABOLIC PANEL
ALBUMIN: 3.8 g/dL (ref 3.5–5.0)
ALK PHOS: 84 U/L (ref 38–126)
ALT: 15 U/L (ref 0–44)
AST: 19 U/L (ref 15–41)
Anion gap: 10 (ref 5–15)
BILIRUBIN TOTAL: 1.4 mg/dL — AB (ref 0.3–1.2)
BUN: 16 mg/dL (ref 8–23)
CALCIUM: 9.8 mg/dL (ref 8.9–10.3)
CO2: 23 mmol/L (ref 22–32)
CREATININE: 0.73 mg/dL (ref 0.44–1.00)
Chloride: 104 mmol/L (ref 98–111)
GFR calc Af Amer: >60 mL/min (ref >60)
GFR calc non Af Amer: >60 mL/min (ref >60)
GLUCOSE: 119 mg/dL — AB (ref 70–99)
Potassium: 4.8 mmol/L (ref 3.5–5.1)
SODIUM: 137 mmol/L (ref 135–145)
TOTAL PROTEIN: 6.7 g/dL (ref 6.5–8.1)

## 2017-08-13 LAB — CBC WITH DIFFERENTIAL/PLATELET
ABS IMMATURE GRANULOCYTES: 0 10*3/uL (ref 0.0–0.1)
Basophils Absolute: 0 10*3/uL (ref 0.0–0.1)
Basophils Relative: 1 %
Eosinophils Absolute: 0.1 10*3/uL (ref 0.0–0.7)
Eosinophils Relative: 2 %
HCT: 37 % (ref 36.0–46.0)
HEMOGLOBIN: 12.3 g/dL (ref 12.0–15.0)
Immature Granulocytes: 1 %
Lymphocytes Relative: 29 %
Lymphs Abs: 1.7 10*3/uL (ref 0.7–4.0)
MCH: 30.6 pg (ref 26.0–34.0)
MCHC: 33.2 g/dL (ref 30.0–36.0)
MCV: 92 fL (ref 78.0–100.0)
MONO ABS: 0.4 10*3/uL (ref 0.1–1.0)
Monocytes Relative: 7 %
NEUTROS ABS: 3.6 10*3/uL (ref 1.7–7.7)
Neutrophils Relative %: 60 %
Platelets: 364 10*3/uL (ref 150–400)
RBC: 4.02 MIL/uL (ref 3.87–5.11)
RDW: 14.7 % (ref 11.5–15.5)
WBC: 5.9 10*3/uL (ref 4.0–10.5)

## 2017-08-13 NOTE — Progress Notes (Signed)
PCP Kelton Pillar MD   Denies any cardiac problems  Denies ever seeing a cardiologist  Denies any cardiac testing

## 2017-08-14 ENCOUNTER — Ambulatory Visit
Admission: RE | Admit: 2017-08-14 | Discharge: 2017-08-14 | Disposition: A | Discharge status: Home / Self Care | Payer: BLUE CROSS/BLUE SHIELD | Source: Ambulatory Visit | Attending: Family Medicine | Admitting: Family Medicine

## 2017-08-14 DIAGNOSIS — J189 Pneumonia, unspecified organism: Secondary | ICD-10-CM | POA: Diagnosis not present

## 2017-08-14 DIAGNOSIS — R918 Other nonspecific abnormal finding of lung field: Secondary | ICD-10-CM

## 2017-08-14 NOTE — Progress Notes (Signed)
Anesthesia Chart Review:  Case:  [id number] Date/Time:  08/18/17 0715   Procedures:      LAPAROSCOPIC UMBILICAL HERNIA ERAS PATHWAY (N/A )     INSERTION OF MESH (N/A )   Anesthesia type:  General   Pre-op diagnosis:  umbilical hernia   Location:  [facility] OR ROOM 08 / [facility]   Surgeon:  Greer Pickerel, MD     DISCUSSION: 72 yo female current smoker. Pertinent hx includes GERD, Esophagitis, Hiatal hernia, Recurrent bronchitis/PNA, HTN, Asthma. S/p open left inguinal hernia repair 01/10/2015.  Asked to review chart due to abnormal EKG showing sinus rhythm with 1st degree A-V block. Possible Inferior infarct , age undetermined. There does not appear to be any significant change compared to tracing from 12/28/2014. She denies cardiac history, denies cardiac symptoms, denies ever being seen by cardiologist.  Anticipate she can proceed with surgery as scheduled barring acute status change.  VS: BP (!) 159/72   Pulse 78   Temp 36.8 C   Resp 20   Ht 5\' 5"  (1.651 m)   Wt 156 lb 11.2 oz (71.1 kg)   SpO2 97%   BMI 26.08 kg/m   PROVIDERS: Kelton Pillar, MD is PCP   LABS: Labs reviewed: Acceptable for surgery. (all labs ordered are listed, but only abnormal results are displayed)  Labs Reviewed  COMPREHENSIVE METABOLIC PANEL - Abnormal; Notable for the following components:      Result Value   Glucose, Bld 119 (*)    Total Bilirubin 1.4 (*)    All other components within normal limits  CBC WITH DIFFERENTIAL/PLATELET     IMAGES: CHEST - 2 VIEW 06/01/2017  COMPARISON:  05/15/2017  FINDINGS: Cardiac shadow is stable. The lungs are well aerated bilaterally. The previously seen lingular infiltrate is somewhat less present. No new infiltrate is seen. No effusion is noted. No bony abnormality is seen.  IMPRESSION: Improved changes in the lingula. This likely represents resolution of acute infiltrate superimposed over more chronic area of scarring. Noncontrast CT would be helpful to  assess and establish a baseline for further follow-up.  CT Abd/Pelvis with contrast 08/03/2017: FINDINGS:  Heart size normal. No pericardial or pleural effusion. No lung base consolidation or nodularity. Normal liver. Normal spleen. Previous cholecystectomy. Normal pancreas. Normal adrenal glands. Normal kidneys. Bilateral upper pole renal cysts. Normal caliber aorta. No para-aortic lymphadenopathy. Diverticulosis. No dilated loops of large or small bowel. Fat-containing umbilical hernia with associated edema within the involved fat. Normal appendix. Degenerated uterine calcified fibroids. Decompressed urinary bladder. Normal appearance of the left ovary and right ovary. No pelvic lymphadenopathy. Internal fixation of the right femoral neck. Severe facet arthropathy in the lower lumbar spine.  EKG: 08/13/2017: Sinus rhythm with 1st degree A-V block. Possible Inferior infarct , age undetermined   CV:N/A  Past Medical History:  Diagnosis Date  . Asthma   . Bronchitis    on bactrin since 12-27-14  . Colon polyps 2001  . Diverticulosis of colon (without mention of hemorrhage) 2001  . Dizziness    inner ear problem at times, saw dr  [name] for  . GERD (gastroesophageal reflux disease)   . Hiatal hernia 2001  . Hypertension   . IH (inguinal hernia)    left  . Other esophagitis 2001    Past Surgical History:  Procedure Laterality Date  . breast biospy     left, benign  . CHOLECYSTECTOMY    . COLONOSCOPY    . ENDOVENOUS ABLATION SAPHENOUS VEIN W/ LASER  Left 04/29/2016   endovenous laser ablation L SSV and stab phlebectomy left leg by Tinnie Gens MD   . [place]  oct 2015   left  . INGUINAL HERNIA REPAIR Left 01/10/2015   Procedure: OPEN REPAIR OF RECURRENT LEFT INGUINAL HERNIA ;  Surgeon: Greer Pickerel, MD;  Location: [facility] ORS;  Service: General;  Laterality: Left;  . INSERTION OF MESH Left 01/10/2015   Procedure: INSERTION OF MESH;  Surgeon: Greer Pickerel, MD;   Location: [facility] ORS;  Service: General;  Laterality: Left;  . LAPAROSCOPY N/A 01/10/2015   Procedure: LAPAROSCOPY DIAGNOSTIC;  Surgeon: Greer Pickerel, MD;  Location: [facility] ORS;  Service: General;  Laterality: N/A;  . ORIF HIP FRACTURE  2006   3 pins, right    MEDICATIONS: . Albuterol Sulfate (PROAIR HFA IN)  . diphenhydrAMINE (BENADRYL) 25 MG tablet  . ibuprofen (ADVIL,MOTRIN) 200 MG tablet  . losartan-hydrochlorothiazide (HYZAAR) 50-12.5 MG tablet  . nicotine polacrilex (NICORETTE) 4 MG gum  . omeprazole (PRILOSEC OTC) 20 MG tablet  . oxyCODONE (OXY IR/ROXICODONE) 5 MG immediate release tablet  . SYMBICORT 80-4.5 MCG/ACT inhaler  . Turmeric 500 MG TABS   No current facility-administered medications for this encounter.      Wynonia Musty [facility] Short Stay Center/Anesthesiology Phone [phone] 08/14/2017 12:41 PM

## 2017-08-17 MED ORDER — BUPIVACAINE LIPOSOME 1.3 % IJ SUSP
20.0000 mL | INTRAMUSCULAR | Status: AC
  Administered 2017-08-18: 20 mL
  Filled 2017-08-17: qty 20

## 2017-08-17 NOTE — Anesthesia Preprocedure Evaluation (Addendum)
Anesthesia Evaluation  Patient identified by MRN, date of birth, ID band Patient awake    Reviewed: Allergy & Precautions, NPO status , Patient's Chart, lab work & pertinent test results  History of Anesthesia Complications Negative for: history of anesthetic complications  Airway Mallampati: II  TM Distance: >3 FB Neck ROM: Full    Dental  (+) Dental Advisory Given, Teeth Intact,    Pulmonary asthma , Current Smoker, former smoker,    breath sounds clear to auscultation       Cardiovascular hypertension, Pt. on medications  Rhythm:Regular Rate:Normal   EKG - SR, 1st deg AVB, inferior Q waves   Neuro/Psych negative neurological ROS  negative psych ROS   GI/Hepatic Neg liver ROS, hiatal hernia, GERD  Controlled and Medicated,  Endo/Other  negative endocrine ROS  Renal/GU negative Renal ROS  negative genitourinary   Musculoskeletal negative musculoskeletal ROS (+)   Abdominal   Peds  Hematology negative hematology ROS (+)   Anesthesia Other Findings   Reproductive/Obstetrics                         Anesthesia Physical Anesthesia Plan  ASA: II  Anesthesia Plan: General   Post-op Pain Management:    Induction: Intravenous  PONV Risk Score and Plan: 4 or greater and Treatment may vary due to age or medical condition, Ondansetron and Dexamethasone  Airway Management Planned: Oral ETT  Additional Equipment: None  Intra-op Plan:   Post-operative Plan: Extubation in OR  Informed Consent: I have reviewed the patients History and Physical, chart, labs and discussed the procedure including the risks, benefits and alternatives for the proposed anesthesia with the patient or authorized representative who has indicated his/her understanding and acceptance.   Dental advisory given  Plan Discussed with: CRNA and Anesthesiologist  Anesthesia Plan Comments:        Anesthesia Quick  Evaluation

## 2017-08-18 ENCOUNTER — Ambulatory Visit (HOSPITAL_COMMUNITY)
Admission: RE | Admit: 2017-08-18 | Discharge: 2017-08-18 | Disposition: A | Discharge status: Home / Self Care | Payer: BLUE CROSS/BLUE SHIELD | Source: Ambulatory Visit | Attending: General Surgery | Admitting: General Surgery

## 2017-08-18 ENCOUNTER — Encounter (HOSPITAL_COMMUNITY): Payer: Self-pay | Admitting: *Deleted

## 2017-08-18 ENCOUNTER — Encounter (HOSPITAL_COMMUNITY)
Admission: RE | Disposition: A | Discharge status: Home / Self Care | Payer: Self-pay | Source: Ambulatory Visit | Attending: General Surgery

## 2017-08-18 ENCOUNTER — Ambulatory Visit (HOSPITAL_COMMUNITY): Payer: BLUE CROSS/BLUE SHIELD | Admitting: Physician Assistant

## 2017-08-18 ENCOUNTER — Ambulatory Visit (HOSPITAL_COMMUNITY): Payer: BLUE CROSS/BLUE SHIELD | Admitting: Anesthesiology

## 2017-08-18 DIAGNOSIS — Z8249 Family history of ischemic heart disease and other diseases of the circulatory system: Secondary | ICD-10-CM | POA: Diagnosis not present

## 2017-08-18 DIAGNOSIS — J45909 Unspecified asthma, uncomplicated: Secondary | ICD-10-CM | POA: Diagnosis not present

## 2017-08-18 DIAGNOSIS — Z8719 Personal history of other diseases of the digestive system: Secondary | ICD-10-CM | POA: Insufficient documentation

## 2017-08-18 DIAGNOSIS — I10 Essential (primary) hypertension: Secondary | ICD-10-CM | POA: Insufficient documentation

## 2017-08-18 DIAGNOSIS — Z79899 Other long term (current) drug therapy: Secondary | ICD-10-CM | POA: Insufficient documentation

## 2017-08-18 DIAGNOSIS — Z885 Allergy status to narcotic agent status: Secondary | ICD-10-CM | POA: Insufficient documentation

## 2017-08-18 DIAGNOSIS — Z881 Allergy status to other antibiotic agents status: Secondary | ICD-10-CM | POA: Diagnosis not present

## 2017-08-18 DIAGNOSIS — K219 Gastro-esophageal reflux disease without esophagitis: Secondary | ICD-10-CM | POA: Diagnosis not present

## 2017-08-18 DIAGNOSIS — K429 Umbilical hernia without obstruction or gangrene: Secondary | ICD-10-CM | POA: Diagnosis not present

## 2017-08-18 DIAGNOSIS — K420 Umbilical hernia with obstruction, without gangrene: Secondary | ICD-10-CM | POA: Insufficient documentation

## 2017-08-18 DIAGNOSIS — E7800 Pure hypercholesterolemia, unspecified: Secondary | ICD-10-CM | POA: Insufficient documentation

## 2017-08-18 DIAGNOSIS — Z87891 Personal history of nicotine dependence: Secondary | ICD-10-CM | POA: Diagnosis not present

## 2017-08-18 HISTORY — PX: INSERTION OF MESH: SHX5868

## 2017-08-18 HISTORY — PX: UMBILICAL HERNIA REPAIR: SHX196

## 2017-08-18 SURGERY — REPAIR, HERNIA, UMBILICAL, LAPAROSCOPIC
Anesthesia: General | Site: Abdomen

## 2017-08-18 MED ORDER — DEXAMETHASONE SODIUM PHOSPHATE 4 MG/ML IJ SOLN
INTRAMUSCULAR | Status: DC | PRN
  Administered 2017-08-18: 5 mg via INTRAVENOUS

## 2017-08-18 MED ORDER — ONDANSETRON HCL 4 MG/2ML IJ SOLN: 4.0000 mg | Freq: Once | INTRAMUSCULAR | Status: DC | PRN

## 2017-08-18 MED ORDER — KETOROLAC TROMETHAMINE 30 MG/ML IJ SOLN
INTRAMUSCULAR | Status: AC
  Filled 2017-08-18: qty 1

## 2017-08-18 MED ORDER — FENTANYL CITRATE (PF) 100 MCG/2ML IJ SOLN
INTRAMUSCULAR | Status: DC | PRN
  Administered 2017-08-18: 100 ug via INTRAVENOUS

## 2017-08-18 MED ORDER — FENTANYL CITRATE (PF) 100 MCG/2ML IJ SOLN
25.0000 ug | INTRAMUSCULAR | Status: DC | PRN
  Administered 2017-08-18: 25 ug via INTRAVENOUS
  Administered 2017-08-18: 50 ug via INTRAVENOUS
  Administered 2017-08-18: 25 ug via INTRAVENOUS

## 2017-08-18 MED ORDER — ROCURONIUM BROMIDE 100 MG/10ML IV SOLN
INTRAVENOUS | Status: DC | PRN
  Administered 2017-08-18: 50 mg via INTRAVENOUS

## 2017-08-18 MED ORDER — PROPOFOL 10 MG/ML IV BOLUS
INTRAVENOUS | Status: DC | PRN
  Administered 2017-08-18: 150 mg via INTRAVENOUS
  Administered 2017-08-18: 20 mg via INTRAVENOUS

## 2017-08-18 MED ORDER — BUPIVACAINE-EPINEPHRINE 0.5% -1:200000 IJ SOLN
INTRAMUSCULAR | Status: DC | PRN
  Administered 2017-08-18: 30 mL

## 2017-08-18 MED ORDER — FENTANYL CITRATE (PF) 100 MCG/2ML IJ SOLN
INTRAMUSCULAR | Status: AC
  Filled 2017-08-18: qty 2

## 2017-08-18 MED ORDER — ALBUTEROL SULFATE HFA 108 (90 BASE) MCG/ACT IN AERS
INHALATION_SPRAY | RESPIRATORY_TRACT | Status: DC | PRN
  Administered 2017-08-18 (×2): 2 via RESPIRATORY_TRACT

## 2017-08-18 MED ORDER — FENTANYL CITRATE (PF) 250 MCG/5ML IJ SOLN
INTRAMUSCULAR | Status: AC
  Filled 2017-08-18: qty 5

## 2017-08-18 MED ORDER — MIDAZOLAM HCL 2 MG/2ML IJ SOLN
INTRAMUSCULAR | Status: AC
  Filled 2017-08-18: qty 2

## 2017-08-18 MED ORDER — GABAPENTIN 300 MG PO CAPS
300.0000 mg | ORAL_CAPSULE | ORAL | Status: AC
  Administered 2017-08-18: 300 mg via ORAL

## 2017-08-18 MED ORDER — ACETAMINOPHEN 500 MG PO TABS
ORAL_TABLET | ORAL | Status: AC
  Filled 2017-08-18: qty 2

## 2017-08-18 MED ORDER — LIDOCAINE HCL (CARDIAC) PF 100 MG/5ML IV SOSY
PREFILLED_SYRINGE | INTRAVENOUS | Status: DC | PRN
  Administered 2017-08-18: 80 mg via INTRAVENOUS

## 2017-08-18 MED ORDER — LACTATED RINGERS IV SOLN
INTRAVENOUS | Status: DC | PRN
  Administered 2017-08-18: 07:00:00 via INTRAVENOUS

## 2017-08-18 MED ORDER — ONDANSETRON HCL 4 MG/2ML IJ SOLN
INTRAMUSCULAR | Status: AC
  Filled 2017-08-18: qty 2

## 2017-08-18 MED ORDER — GABAPENTIN 300 MG PO CAPS
ORAL_CAPSULE | ORAL | Status: AC
  Filled 2017-08-18: qty 1

## 2017-08-18 MED ORDER — CHLORHEXIDINE GLUCONATE 4 % EX LIQD: 60.0000 mL | Freq: Once | CUTANEOUS | Status: DC

## 2017-08-18 MED ORDER — PHENYLEPHRINE HCL 10 MG/ML IJ SOLN
INTRAMUSCULAR | Status: DC | PRN
  Administered 2017-08-18: 40 ug via INTRAVENOUS

## 2017-08-18 MED ORDER — ACETAMINOPHEN 500 MG PO TABS
1000.0000 mg | ORAL_TABLET | ORAL | Status: AC
  Administered 2017-08-18: 1000 mg via ORAL

## 2017-08-18 MED ORDER — ONDANSETRON HCL 4 MG/2ML IJ SOLN
INTRAMUSCULAR | Status: DC | PRN
  Administered 2017-08-18: 4 mg via INTRAVENOUS

## 2017-08-18 MED ORDER — OXYCODONE HCL 5 MG PO TABS: 5.0000 mg | ORAL_TABLET | Freq: Four times a day (QID) | ORAL | 0 refills | Status: DC | PRN

## 2017-08-18 MED ORDER — OXYCODONE HCL 5 MG/5ML PO SOLN: 5.0000 mg | Freq: Once | ORAL | Status: DC | PRN

## 2017-08-18 MED ORDER — MIDAZOLAM HCL 5 MG/5ML IJ SOLN
INTRAMUSCULAR | Status: DC | PRN
  Administered 2017-08-18: 1 mg via INTRAVENOUS

## 2017-08-18 MED ORDER — ALBUTEROL SULFATE HFA 108 (90 BASE) MCG/ACT IN AERS
INHALATION_SPRAY | RESPIRATORY_TRACT | Status: AC
  Filled 2017-08-18: qty 6.7

## 2017-08-18 MED ORDER — STERILE WATER FOR IRRIGATION IR SOLN
Status: DC | PRN
  Administered 2017-08-18: 1000 mL

## 2017-08-18 MED ORDER — 0.9 % SODIUM CHLORIDE (POUR BTL) OPTIME
TOPICAL | Status: DC | PRN
  Administered 2017-08-18: 1000 mL

## 2017-08-18 MED ORDER — CEFAZOLIN SODIUM-DEXTROSE 2-4 GM/100ML-% IV SOLN
2.0000 g | INTRAVENOUS | Status: AC
  Administered 2017-08-18: 2 g via INTRAVENOUS

## 2017-08-18 MED ORDER — BUPIVACAINE-EPINEPHRINE (PF) 0.5% -1:200000 IJ SOLN
INTRAMUSCULAR | Status: AC
  Filled 2017-08-18: qty 30

## 2017-08-18 MED ORDER — OXYCODONE HCL 5 MG PO TABS: 5.0000 mg | ORAL_TABLET | Freq: Once | ORAL | Status: DC | PRN

## 2017-08-18 MED ORDER — KETOROLAC TROMETHAMINE 30 MG/ML IJ SOLN
INTRAMUSCULAR | Status: DC | PRN
  Administered 2017-08-18: 15 mg via INTRAVENOUS

## 2017-08-18 MED ORDER — SUGAMMADEX SODIUM 200 MG/2ML IV SOLN
INTRAVENOUS | Status: DC | PRN
  Administered 2017-08-18: 100 mg via INTRAVENOUS
  Administered 2017-08-18: 200 mg via INTRAVENOUS

## 2017-08-18 SURGICAL SUPPLY — 53 items
ADH SKN CLS APL DERMABOND .7 (GAUZE/BANDAGES/DRESSINGS) ×1
APL SKNCLS STERI-STRIP NONHPOA (GAUZE/BANDAGES/DRESSINGS) ×1
APPLIER CLIP LOGIC TI 5 (MISCELLANEOUS) IMPLANT
APR CLP MED LRG 33X5 (MISCELLANEOUS)
BENZOIN TINCTURE PRP APPL 2/3 (GAUZE/BANDAGES/DRESSINGS) ×2 IMPLANT
BINDER ABDOMINAL 12 ML 46-62 (SOFTGOODS) ×2 IMPLANT
BLADE CLIPPER SURG (BLADE) IMPLANT
CANISTER SUCT 3000ML PPV (MISCELLANEOUS) IMPLANT
CHLORAPREP W/TINT 26ML (MISCELLANEOUS) ×3 IMPLANT
CLOSURE WOUND 1/2 X4 (GAUZE/BANDAGES/DRESSINGS) ×1
COVER SURGICAL LIGHT HANDLE (MISCELLANEOUS) ×3 IMPLANT
DERMABOND ADVANCED (GAUZE/BANDAGES/DRESSINGS) ×2
DERMABOND ADVANCED .7 DNX12 (GAUZE/BANDAGES/DRESSINGS) ×1 IMPLANT
DEVICE SECURE STRAP 25 ABSORB (INSTRUMENTS) ×3 IMPLANT
DEVICE TROCAR PUNCTURE CLOSURE (ENDOMECHANICALS) ×3 IMPLANT
DRAPE INCISE IOBAN 66X45 STRL (DRAPES) IMPLANT
DRSG TEGADERM 2-3/8X2-3/4 SM (GAUZE/BANDAGES/DRESSINGS) ×2 IMPLANT
ELECT REM PT RETURN 9FT ADLT (ELECTROSURGICAL) ×3
ELECTRODE REM PT RTRN 9FT ADLT (ELECTROSURGICAL) ×1 IMPLANT
GAUZE SPONGE 2X2 8PLY STRL LF (GAUZE/BANDAGES/DRESSINGS) IMPLANT
GLOVE BIOGEL M STRL SZ7.5 (GLOVE) ×3 IMPLANT
GLOVE BIOGEL PI IND STRL 8 (GLOVE) ×1 IMPLANT
GLOVE BIOGEL PI INDICATOR 8 (GLOVE) ×2
GOWN STRL REUS W/ TWL LRG LVL3 (GOWN DISPOSABLE) ×2 IMPLANT
GOWN STRL REUS W/TWL 2XL LVL3 (GOWN DISPOSABLE) ×3 IMPLANT
GOWN STRL REUS W/TWL LRG LVL3 (GOWN DISPOSABLE) ×6
KIT BASIN OR (CUSTOM PROCEDURE TRAY) ×3 IMPLANT
KIT TURNOVER KIT B (KITS) ×3 IMPLANT
MARKER SKIN DUAL TIP RULER LAB (MISCELLANEOUS) ×3 IMPLANT
MESH VENTRALIGHT ST 4.5IN (Mesh General) ×2 IMPLANT
NDL SPNL 22GX3.5 QUINCKE BK (NEEDLE) ×1 IMPLANT
NEEDLE SPNL 22GX3.5 QUINCKE BK (NEEDLE) ×3 IMPLANT
NS IRRIG 1000ML POUR BTL (IV SOLUTION) ×3 IMPLANT
PAD ARMBOARD 7.5X6 YLW CONV (MISCELLANEOUS) ×6 IMPLANT
PENCIL BUTTON HOLSTER BLD 10FT (ELECTRODE) ×2 IMPLANT
SCISSORS LAP 5X35 DISP (ENDOMECHANICALS) ×3 IMPLANT
SET IRRIG TUBING LAPAROSCOPIC (IRRIGATION / IRRIGATOR) IMPLANT
SHEARS HARMONIC ACE PLUS 36CM (ENDOMECHANICALS) IMPLANT
SLEEVE ENDOPATH XCEL 5M (ENDOMECHANICALS) ×3 IMPLANT
SPONGE GAUZE 2X2 STER 10/PKG (GAUZE/BANDAGES/DRESSINGS) ×2
STRIP CLOSURE SKIN 1/2X4 (GAUZE/BANDAGES/DRESSINGS) ×1 IMPLANT
SUT MNCRL AB 4-0 PS2 18 (SUTURE) ×5 IMPLANT
SUT NOVA 1 T20/GS 25DT (SUTURE) ×5 IMPLANT
SUT VIC AB 3-0 SH 18 (SUTURE) ×2 IMPLANT
TOWEL OR 17X24 6PK STRL BLUE (TOWEL DISPOSABLE) ×3 IMPLANT
TOWEL OR 17X26 10 PK STRL BLUE (TOWEL DISPOSABLE) ×3 IMPLANT
TRAY FOLEY CATH SILVER 16FR (SET/KITS/TRAYS/PACK) IMPLANT
TRAY LAPAROSCOPIC MC (CUSTOM PROCEDURE TRAY) ×3 IMPLANT
TROCAR XCEL BLUNT TIP 100MML (ENDOMECHANICALS) IMPLANT
TROCAR XCEL NON-BLD 11X100MML (ENDOMECHANICALS) ×3 IMPLANT
TROCAR XCEL NON-BLD 5MMX100MML (ENDOMECHANICALS) ×3 IMPLANT
TUBING INSUFFLATION (TUBING) ×1 IMPLANT
WATER STERILE IRR 1000ML POUR (IV SOLUTION) ×3 IMPLANT

## 2017-08-18 NOTE — Op Note (Signed)
Mandy Delacruz [id number] 1945-03-02 08/18/2017   Laparoscopic Assisted Repair of  Umbilical Hernia with Mesh Procedure Note  Indications: Symptomatic umbilical hernia  Pre-operative Diagnosis: umbilical hernia  Post-operative Diagnosis: umbilical hernia  Surgeon: Greer Pickerel MD FACS  Assistants: Judyann Munson RNFA  Anesthesia: General endotracheal anesthesia   Procedure Details  The patient was seen in the Holding Room. The risks, benefits, complications, treatment options, and expected outcomes were discussed with the patient. The possibilities of reaction to medication, pulmonary aspiration, perforation of viscus, bleeding, recurrent infection, the need for additional procedures, failure to diagnose a condition, and creating a complication requiring transfusion or operation were discussed with the patient. The patient concurred with the proposed plan, giving informed consent.  The site of surgery properly noted/marked. The patient was taken to the operating room, identified as Mandy Delacruz  and the procedure verified as laparoscopic assisted umbilical hernia repair with mesh. A Time Out was held and the above information confirmed.    The patient was placed supine.  After establishing general anesthesia, her Left arm was tucked with the appropriate padding.  The abdomen was prepped with Chloraprep and draped in standard fashion.  A 5 mm Optiview was used the cannulate the peritoneal cavity in the left upper quadrant below the costal margin.  Pneumoperitoneum was obtained by insufflating CO2, maintaining a maximum pressure of 15 mmHg.  The 5 mm 30-degree laparoscopic was inserted.  There were no significant omental adhesions to the anterior abdominal wall in and around the hernia defect.   Another 5-mm port was placed in the left lateral abdominal wall.  There was a plug of omentum tethered into the umbilical hernia.  Using traction with an atraumatic grasper I was able to reduce the  incarcerated plug of omentum.   We visualized 1 fascial defect.   I then proceeded with the open portion of the procedure.  A curvilinear infraumbilical incision was created. Dissection was carried down to the hernia sac located above the fascia and was mobilized from surrounding structures. Intact fascia was identified circumferentially around the defect.  The hernia sac was excised and discarded.  Skin and soft tissue was mobilized from the surface of the fascia in a circumferential manner. A finger sweep was performed underneath the fascia to ensure there were no adhesions to the anterior abdominal wall.  I then measured the defect. It measured 2 cm x 2 cm.  With adding a 5 cm overlap circumferentially this gave [place] a mesh requirement of a round 11 cm.  Therefore I selected a round piece of Bard ventral light ST mesh measuring 11.4 cm We placed 4 stay sutures of 1-0 Novofil around the edges of the mesh.  The mesh was then rolled up and inserted through the fascial defect. The fascia was then closed primarily over the mesh with 4 interrupted 1-0-novafil sutures.   I then returned laparoscopically.  I then infiltrated Exparel in a regional fashion in the preperitoneal space around the umbilical fascial closure and around the expected locations of where the transfascial sutures would be placed.  The mesh was then unrolled.  The stay sutures were then pulled up through small stab incisions using the Endo-close device.  This deployed the mesh widely over the fascial defects.  The stay sutures were then tied down.  The Secure Strap device was then used to tack down the edges of the mesh at 1 cm intervals circumferentially. We placed a few tacks inside the outer ring of tacks.  We inspected for hemostasis.   Pneumoperitoneum was then released as we removed the remainder of the trocars.   The umbilical cavity was irrigated. The umbilical stalk was then tacked back down to the fascia with two 3-0 vicryl sutures.   The port sites and umbilical incision were closed with 4-0 Monocryl.  All of the incisions and stay suture sites were then covered benzoin, steri-strips and bandages.  An abdominal binder was placed around the patient's abdomen.  The patient was extubated and brought to the recovery room in stable condition.  All sponge, instrument, and needle counts were correct prior to closure and at the conclusion of the case.   Findings: Type of repair - primary suture with mesh underlay Name of mesh - Bard VentralightST  Size of mesh - Round 11.4cm)  Mesh overlap - >5 cm  Placement of mesh - beneath fascia and into peritoneal cavity,  Estimated Blood Loss:  Minimal         Complications:  None; patient tolerated the procedure well.         Disposition: PACU - hemodynamically stable.         Condition: stable

## 2017-08-18 NOTE — Anesthesia Procedure Notes (Addendum)
Procedure Name: Intubation Date/Time: 08/18/2017 7:44 AM Performed by: Jenne Campus, CRNA Pre-anesthesia Checklist: Patient identified, Emergency Drugs available, Suction available, Patient being monitored and Timeout performed Patient Re-evaluated:Patient Re-evaluated prior to induction Oxygen Delivery Method: Circle system utilized Preoxygenation: Pre-oxygenation with 100% oxygen Induction Type: IV induction Ventilation: Mask ventilation without difficulty Laryngoscope Size: Mac and 3 Grade View: Grade II Tube type: Oral Tube size: 7.0 mm Number of attempts: 1 Placement Confirmation: ETT inserted through vocal cords under direct vision,  positive ETCO2,  CO2 detector and breath sounds checked- equal and bilateral Secured at: 22 cm Tube secured with: Tape Dental Injury: Teeth and Oropharynx as per pre-operative assessment  Comments: ett by Violet Baldy

## 2017-08-18 NOTE — Anesthesia Postprocedure Evaluation (Signed)
Anesthesia Post Note  Patient: Mandy Delacruz  Procedure(s) Performed: LAPAROSCOPIC-ASSISTED UMBILICAL HERNIA  REPAIR ERAS PATHWAY (N/A Abdomen) INSERTION OF MESH (N/A Abdomen)     Patient location during evaluation: PACU Anesthesia Type: General Level of consciousness: awake and alert Pain management: pain level controlled Vital Signs Assessment: post-procedure vital signs reviewed and stable Respiratory status: spontaneous breathing, nonlabored ventilation and respiratory function stable Cardiovascular status: blood pressure returned to baseline and stable Postop Assessment: no apparent nausea or vomiting Anesthetic complications: no    Last Vitals:  Vitals:   08/18/17 0948 08/18/17 1003  BP: (!) 144/73 (!) 146/68  Pulse: 61 (!) 59  Resp: 12 11  Temp:  (!) 36.3 C  SpO2: 99% 99%    Last Pain:  Vitals:   08/18/17 1003  TempSrc:   PainSc: [place]

## 2017-08-18 NOTE — Discharge Instructions (Signed)
[facility] °LAPAROSCOPIC SURGERY: POST OP INSTRUCTIONS °Always review your discharge instruction sheet given to you by the facility where your surgery was performed. °IF YOU HAVE DISABILITY OR FAMILY LEAVE FORMS, YOU MUST BRING THEM TO THE OFFICE FOR PROCESSING.   °DO NOT GIVE THEM TO YOUR DOCTOR. ° °PAIN CONTROL ° °1. First take acetaminophen (Tylenol) AND/or ibuprofen (Advil) to control your pain after surgery.  Follow directions on package.  Taking acetaminophen (Tylenol) and/or ibuprofen (Advil) regularly after surgery will help to control your pain and lower the amount of prescription pain medication you may need.  You should not take more than 4,000 mg (4 grams) of acetaminophen (Tylenol) in 24 hours.  You should not take ibuprofen (Advil), aleve, motrin, naprosyn or other NSAIDS if you have a history of stomach ulcers or chronic kidney disease.  °2. A prescription for pain medication may be given to you upon discharge.  Take your pain medication as prescribed, if you still have uncontrolled pain after taking acetaminophen (Tylenol) or ibuprofen (Advil). °3. Use ice packs to help control pain. °4. If you need a refill on your pain medication, please contact your pharmacy.  They will contact our office to request authorization. Prescriptions will not be filled after 5pm or on week-ends. ° °HOME MEDICATIONS °5. Take your usually prescribed medications unless otherwise directed. ° °DIET °6. You should follow a light diet the first few days after arrival home.  Be sure to include lots of fluids daily. Avoid fatty, fried foods.  ° °CONSTIPATION °7. It is common to experience some constipation after surgery and if you are taking pain medication.  Increasing fluid intake and taking a stool softener (such as Colace) will usually help or prevent this problem from occurring.  A mild laxative (Milk of Magnesia or Miralax) should be taken according to package instructions if there are no bowel  movements after 48 hours. ° °WOUND/INCISION CARE °8. Most patients will experience some swelling and bruising in the area of the incisions.  Ice packs will help.  Swelling and bruising can take several days to resolve.  °9. Unless discharge instructions indicate otherwise, follow guidelines below  °a. STERI-STRIPS - you may remove your outer bandages 48 hours after surgery, and you may shower at that time.  You have steri-strips (small skin tapes) in place directly over the incision.  These strips should be left on the skin for 7-10 days.   °b. DERMABOND/SKIN GLUE - you may shower in 24 hours.  The glue will flake off over the next 2-3 weeks. °10. Any sutures or staples will be removed at the office during your follow-up visit. ° °ACTIVITIES °11. You may resume regular (light) daily activities beginning the next day--such as daily self-care, walking, climbing stairs--gradually increasing activities as tolerated.  You may have sexual intercourse when it is comfortable.  Refrain from any heavy lifting or straining until approved by your doctor. °a. You may drive when you are no longer taking prescription pain medication, you can comfortably wear a seatbelt, and you can safely maneuver your car and apply brakes. ° °FOLLOW-UP °12. You should see your doctor in the office for a follow-up appointment approximately 2-3 weeks after your surgery.  You should have been given your post-op/follow-up appointment when your surgery was scheduled.  If you did not receive a post-op/follow-up appointment, make sure that you call for this appointment within a day or two after you arrive home to insure a convenient appointment time. ° °OTHER   INSTRUCTIONS 13. Wear abdominal binder for comfort.   WHEN TO CALL YOUR DOCTOR: 1. Fever over 101.0 2. Inability to urinate 3. Continued bleeding from incision. 4. Increased pain, redness, or drainage from the incision. 5. Increasing abdominal pain  The clinic staff is available to answer  your questions during regular business hours.  Please dont hesitate to call and ask to speak to one of the nurses for clinical concerns.  If you have a medical emergency, go to the nearest emergency room or call 911.  A surgeon from [facility] is always on call at the hospital. [address] [place], [place], [place]  [address] ? [address], [place], [place]   [address] [phone] ? [phone] ? FAX [phone] Web site: [email or web address]

## 2017-08-18 NOTE — Transfer of Care (Signed)
Immediate Anesthesia Transfer of Care Note  Patient: Mandy Delacruz  Procedure(s) Performed: LAPAROSCOPIC-ASSISTED UMBILICAL HERNIA  REPAIR ERAS PATHWAY (N/A Abdomen) INSERTION OF MESH (N/A Abdomen)  Patient Location: PACU  Anesthesia Type:General  Level of Consciousness: awake, oriented and patient cooperative  Airway & Oxygen Therapy: Patient Spontanous Breathing and Patient connected to face mask oxygen  Post-op Assessment: Report given to RN and Post -op Vital signs reviewed and stable  Post vital signs: Reviewed  Last Vitals:  Vitals Value Taken Time  BP    Temp    Pulse    Resp    SpO2      Last Pain:  Vitals:   08/18/17 0631  TempSrc:   PainSc: 0-No pain      Patients Stated Pain Goal: 4 (19/75/88 3254)  Complications: No apparent anesthesia complications

## 2017-08-18 NOTE — Interval H&P Note (Signed)
History and Physical Interval Note:  08/18/2017 7:24 AM  Mandy Delacruz  has presented today for surgery, with the diagnosis of umbilical hernia  The various methods of treatment have been discussed with the patient and family. After consideration of risks, benefits and other options for treatment, the patient has consented to  Procedure(s): [place] (N/A) INSERTION OF MESH (N/A) as a surgical intervention .  The patient's history has been reviewed, patient examined, no change in status, stable for surgery.  I have reviewed the patient's chart and labs.  Questions were answered to the patient's satisfaction.    Leighton Ruff. Redmond Pulling, MD, FACS General, Bariatric, & Minimally Invasive Surgery [facility]  Greer Pickerel

## 2017-08-19 ENCOUNTER — Other Ambulatory Visit: Payer: Self-pay | Admitting: Family Medicine

## 2017-08-19 ENCOUNTER — Encounter (HOSPITAL_COMMUNITY): Payer: Self-pay | Admitting: General Surgery

## 2017-08-19 DIAGNOSIS — R911 Solitary pulmonary nodule: Secondary | ICD-10-CM

## 2017-08-31 ENCOUNTER — Ambulatory Visit
Admission: RE | Admit: 2017-08-31 | Discharge: 2017-08-31 | Disposition: A | Discharge status: Home / Self Care | Payer: BLUE CROSS/BLUE SHIELD | Source: Ambulatory Visit | Attending: Family Medicine | Admitting: Family Medicine

## 2017-08-31 DIAGNOSIS — R918 Other nonspecific abnormal finding of lung field: Secondary | ICD-10-CM | POA: Diagnosis not present

## 2017-08-31 DIAGNOSIS — I700 Atherosclerosis of aorta: Secondary | ICD-10-CM | POA: Insufficient documentation

## 2017-08-31 DIAGNOSIS — R911 Solitary pulmonary nodule: Secondary | ICD-10-CM | POA: Insufficient documentation

## 2017-08-31 LAB — GLUCOSE, CAPILLARY: GLUCOSE-CAPILLARY: 140 mg/dL — AB (ref 70–99)

## 2017-08-31 MED ORDER — FLUDEOXYGLUCOSE F - 18 (FDG) INJECTION
8.1000 | Freq: Once | INTRAVENOUS | Status: AC | PRN
  Administered 2017-08-31: 8.1 via INTRAVENOUS

## 2017-09-01 ENCOUNTER — Encounter: Payer: Self-pay | Admitting: Thoracic Surgery (Cardiothoracic Vascular Surgery)

## 2017-09-01 ENCOUNTER — Institutional Professional Consult (permissible substitution) (INDEPENDENT_AMBULATORY_CARE_PROVIDER_SITE_OTHER): Payer: BLUE CROSS/BLUE SHIELD | Admitting: Thoracic Surgery (Cardiothoracic Vascular Surgery)

## 2017-09-01 VITALS — BP 150/70 | HR 74 | Resp 20 | Ht 65.0 in | Wt 156.0 lb

## 2017-09-01 DIAGNOSIS — R911 Solitary pulmonary nodule: Secondary | ICD-10-CM | POA: Diagnosis not present

## 2017-09-01 NOTE — Progress Notes (Signed)
PCP is Kelton Pillar, MD Referring Provider is Kelton Pillar, MD  Chief Complaint  Patient presents with  . Lung Lesion    Surgical eval, PET Scan 08/31/17, Chest CT 08/14/17    HPI: Mandy Delacruz is a 72 year old woman who sent for consultation regarding a lung nodule in the lingula.  Mandy Delacruz is a 72 year old woman with a history of tobacco abuse, asthma, COPD with chronic bronchitis, hypertension, hiatal hernia, reflux, and inguinal and umbilical hernias.  She says she is been having recurrent bouts of bronchitis since last November.  She estimates that she is been on antibiotics 5 separate times during that timeframe.  Typically that is characterized by cough with sputum production and wheezing.  She occasionally has wheezing outside of these episodes of bronchitis but not frequently.  She does take Symbicort.  She had a chest x-ray in May which showed some opacity at the left lung base.  A follow-up chest x-ray a few weeks later showed some improvement but not complete resolution.  A CT was done in early August and that showed a nodular density in the lingula abutting the pleural surface that was not present on the CT she had done in 2015.  There were other abnormalities with multiple small lung nodules as well as some patchy groundglass opacity in the right upper lobe.  A PET/CT was done yesterday.  It showed minimal uptake in the lingular nodule.  She started smoking at age 28.  She smoked more than a pack a day for most of her adult life.  She says she is been on and off for the past 2 years.  She had pretty much stopped completely in March but has had some use over the weekend she was very nervous about the CT findings.  She works part-time.  She is active.  She does not have any chest pain, pressure, or tightness with exertion.  She denies shortness of breath with normal activities.  She does bruise easily.  She has not had any change in appetite or weight loss. Zubrod Score: At the time  of surgery this patient's most appropriate activity status/level should be described as: [x]     0    Normal activity, no symptoms []     1    Restricted in physical strenuous activity but ambulatory, able to do out light work []     2    Ambulatory and capable of self care, unable to do work activities, up and about >50 % of waking hours                              []     3    Only limited self care, in bed greater than 50% of waking hours []     4    Completely disabled, no self care, confined to bed or chair []     5    Moribund  Past Medical History:  Diagnosis Date  . Asthma   . Bronchitis    on bactrin since 12-27-14  . Colon polyps 2001  . Diverticulosis of colon (without mention of hemorrhage) 2001  . Dizziness    inner ear problem at times, saw dr  [name] for  . GERD (gastroesophageal reflux disease)   . Hiatal hernia 2001  . Hypertension   . IH (inguinal hernia)    left  . Other esophagitis 2001    Past Surgical History:  Procedure Laterality Date  .  breast biospy     left, benign  . CHOLECYSTECTOMY    . COLONOSCOPY    . ENDOVENOUS ABLATION SAPHENOUS VEIN W/ LASER Left 04/29/2016   endovenous laser ablation L SSV and stab phlebectomy left leg by Tinnie Gens MD   . [place]  oct 2015   left  . INGUINAL HERNIA REPAIR Left 01/10/2015   Procedure: OPEN REPAIR OF RECURRENT LEFT INGUINAL HERNIA ;  Surgeon: Greer Pickerel, MD;  Location: [facility];  Service: General;  Laterality: Left;  . INSERTION OF MESH Left 01/10/2015   Procedure: INSERTION OF MESH;  Surgeon: Greer Pickerel, MD;  Location: [facility];  Service: General;  Laterality: Left;  . INSERTION OF MESH N/A 08/18/2017   Procedure: INSERTION OF MESH;  Surgeon: Greer Pickerel, MD;  Location: [facility];  Service: General;  Laterality: N/A;  . LAPAROSCOPY N/A 01/10/2015   Procedure: LAPAROSCOPY DIAGNOSTIC;  Surgeon: Greer Pickerel, MD;  Location: [facility];  Service: General;  Laterality: N/A;  . ORIF HIP FRACTURE  2006    3 pins, right  . UMBILICAL HERNIA REPAIR N/A 08/18/2017   Procedure: LAPAROSCOPIC-ASSISTED UMBILICAL HERNIA  REPAIR ERAS PATHWAY;  Surgeon: Greer Pickerel, MD;  Location: [facility];  Service: General;  Laterality: N/A;    Family History  Problem Relation Age of Onset  . COPD Mother   . Hypertension Father   . Celiac disease Unknown        neice, Sister's daughter  . Colon cancer Neg Hx   . Esophageal cancer Neg Hx   . Rectal cancer Neg Hx   . Stomach cancer Neg Hx     Social History Social History   Tobacco Use  . Smoking status: Current Some Day Smoker    Years: 45.00    Types: Cigarettes    Last attempt to quit: 01/03/2015    Years since quitting: 2.6  . Smokeless tobacco: Never Used  . Tobacco comment: maybe 5-10 cigarettes a day  starts and stops smoking every few days  Substance Use Topics  . Alcohol use: Yes    Comment: social  . Drug use: No    Current Outpatient Medications  Medication Sig Dispense Refill  . Albuterol Sulfate (PROAIR HFA IN) Inhale 2 puffs into the lungs 4 (four) times daily as needed (wheezing).     . diphenhydrAMINE (BENADRYL) 25 MG tablet Take 25 mg by mouth at bedtime.     Marland Kitchen ibuprofen (ADVIL,MOTRIN) 200 MG tablet Take 400 mg by mouth every morning.     Marland Kitchen losartan-hydrochlorothiazide (HYZAAR) 50-12.5 MG tablet Take 1 tablet by mouth daily.  0  . nicotine polacrilex (NICORETTE) 4 MG gum Take 4 mg by mouth as needed for smoking cessation.    Marland Kitchen omeprazole (PRILOSEC OTC) 20 MG tablet Take 20 mg by mouth at bedtime.     . SYMBICORT 80-4.5 MCG/ACT inhaler Take 2 puffs by mouth daily.  11  . Turmeric 500 MG TABS Take 1,000 mg by mouth daily.     No current facility-administered medications for this visit.     Allergies  Allergen Reactions  . Doxycycline Hyclate Rash    Blisters on hands and feet  . Codeine Nausea Only    Review of Systems  Constitutional: Negative for activity change, appetite change and unexpected weight change.  HENT: Negative  for trouble swallowing and voice change.   Respiratory: Positive for cough and wheezing. Negative for shortness of breath.   Cardiovascular: Negative for chest pain and leg swelling.  Gastrointestinal: Positive for abdominal pain (Reflux, recent umbilical hernia surgery).  Genitourinary: Positive for frequency. Negative for difficulty urinating and dysuria.  Musculoskeletal: Negative for arthralgias and myalgias.  Neurological: Negative for seizures, syncope and weakness.  Hematological: Negative for adenopathy. Bruises/bleeds easily.    BP (!) 150/70   Pulse 74   Resp 20   Ht 5\' 5"  (1.651 m)   Wt 156 lb (70.8 kg)   SpO2 96% Comment: RA  BMI 25.96 kg/m  Physical Exam  Constitutional: She is oriented to person, place, and time. She appears well-developed and well-nourished. No distress.  HENT:  Head: Normocephalic and atraumatic.  Mouth/Throat: No oropharyngeal exudate.  Eyes: Conjunctivae and EOM are normal. No scleral icterus.  Neck: Neck supple. No thyromegaly present.  Cardiovascular: Normal rate and regular rhythm. Exam reveals no gallop and no friction rub.  Murmur (2/6 high-pitched murmur right upper sternal border) heard. Pulmonary/Chest: Effort normal. No respiratory distress. She has no wheezes. She has no rales.  Abdominal: Soft. She exhibits no distension. There is no tenderness.  Musculoskeletal: She exhibits no edema or deformity.  Lymphadenopathy:    She has no cervical adenopathy.  Neurological: She is alert and oriented to person, place, and time. No cranial nerve deficit. She exhibits normal muscle tone. Coordination normal.  Skin: Skin is warm and dry.  Vitals reviewed.    Diagnostic Tests: CT CHEST WITHOUT CONTRAST  TECHNIQUE: Multidetector CT imaging of the chest was performed following the standard protocol without IV contrast.  COMPARISON:  Multiple prior chest x-rays. CT chest 07/13/2013, 05/03/2013.  FINDINGS: Cardiovascular: Normal heart  size. Mitral annular calcification and aortic annular calcification. Moderate to marked aortic valvular calcification. Mild three-vessel coronary atherosclerosis. No pericardial effusion. Moderate atherosclerosis involving the thoracic and proximal abdominal aorta without evidence of aneurysm.  Mediastinum/Nodes: No pathologically enlarged mediastinal, hilar or axillary lymph nodes. No mediastinal masses. Normal-appearing esophagus.  Lungs/Pleura: Emphysematous changes throughout both lungs. Pleural based masslike opacity involving the lingula, corresponding to the chest x-ray findings, the nodular component measuring approximately 1.5 x 1.5 cm (series 8, image 94); scarring was present in this location on the prior CTs, though the nodular component is a new finding. Approximate 4 mm nodule involving the LATERAL RIGHT LOWER LOBE (series 8, image 93). No parenchymal nodules or masses elsewhere in either lung. Patchy ground-glass opacity in the LATERAL RIGHT UPPER LOBE and the ANTERIOR RIGHT MIDDLE LOBE. No confluent airspace consolidation. No pleural effusions. Mucous plugging of a posterior segment RIGHT LOWER LOBE bronchus which mimics a nodule.  Upper Abdomen: Prior cholecystectomy. Visualized upper abdomen unremarkable for the unenhanced technique.  Musculoskeletal: Osseous demineralization. Exaggeration of the usual thoracic kyphosis. Degenerative disc disease involving the visualized lower cervical spine. Spondylosis involving the mid and lower thoracic spine. No acute findings.  IMPRESSION: 1. Pleural-based masslike opacity involving the lingula which accounts for the prior chest x-ray finding. The nodular component of the opacity measures approximately 1.5 cm and is new since prior CTs (where there was only scarring in the lingula). PET-CT may be helpful in further evaluation to determine if the nodular component represents malignancy, given its persistent after  treatment for pneumonia. 2. Ground-glass opacities in the LATERAL RIGHT UPPER LOBE and the ANTERIOR RIGHT MIDDLE LOBE. Initial follow-up with CT at 6-12 months is recommended to confirm persistence. If persistent, repeat CT is recommended every 2 years until 5 years of stability has been established. This recommendation follows the consensus statement: Guidelines for Management of Incidental Pulmonary Nodules Detected on  CT Images: From the Fleischner Society 2017; Radiology 2017; [phone]. 3. 4 mm nodule involving the RIGHT LOWER LOBE. This can be followed on subsequent CTs. 4. Moderate to marked aortic valvular calcification. Mild three-vessel coronary atherosclerosis.  Aortic Atherosclerosis (ICD10-I70.0) and Emphysema (ICD10-J43.9).   Electronically Signed   By: Evangeline Dakin M.D.   On: 08/14/2017 21:23 NUCLEAR MEDICINE PET SKULL BASE TO THIGH  TECHNIQUE: 8.1 mCi F-18 FDG was injected intravenously. Full-ring PET imaging was performed from the skull base to thigh after the radiotracer. CT data was obtained and used for attenuation correction and anatomic localization.  Fasting blood glucose: 140 mg/dl  COMPARISON:  CT scan 08/14/2017  FINDINGS: Mediastinal blood pool activity: SUV max 2.8  NECK: No hypermetabolic lymph nodes in the neck.  Incidental CT findings: none  CHEST: No hypermetabolic hilar, axillary, or mediastinal lymphadenopathy. The pleural-based masslike opacity described on the recent chest CT shows only low level FDG uptake with SUV max = 1.7. No hypermetabolism is identified in the scattered ground-glass opacity seen on the recent diagnostic chest CT. Other non ground-glass nodules seen on the previous CT are below the threshold for reliable resolution on PET imaging.  Incidental CT findings: Atherosclerotic calcification is noted in the wall of the thoracic aorta.  ABDOMEN/PELVIS: No abnormal hypermetabolic activity within  the liver, pancreas, adrenal glands, or spleen. No hypermetabolic lymph nodes in the abdomen or pelvis.  Incidental CT findings: Uptake is identified along the anterior abdominal wall in the region of the umbilicus. Patient is status post umbilical hernia repair on 08/18/2017. 2.6 x 7.4 cm fluid collection is identified just deep to the anterior abdominal wall in the region of the recent surgery.  Diverticular changes are noted in the left colon.  SKELETON: No focal hypermetabolic activity to suggest skeletal metastasis.  Incidental CT findings: none  IMPRESSION: 1. Low level FDG accumulation in the previously described pleural-based masslike opacity in the lingula. Imaging features may be infectious/inflammatory in etiology although low-grade or well differentiated neoplasm is not entirely excluded. Continued attention will be required. 2. No malignant range hypermetabolism identified in the additional ground-glass and solid lung nodules identified on previous diagnostic chest CT. Surveillance based on recommendations on the report for 08/14/2017 exam recommended. 3. Low level FDG accumulation in the anterior abdominal wall in the region of the umbilicus, compatible with recent umbilical hernia repair. 3 x 7 cm fluid collection in the region of repair may represent postoperative seroma or hematoma. 4.  Aortic Atherosclerois (ICD10-170.0)   Electronically Signed   By: Misty Stanley M.D.   On: 08/31/2017 11:34 I personally reviewed the CT and PET/CT images and concur with the findings noted above  Impression: Mandy Delacruz is a 72 year old woman with a history of tobacco abuse who was recently found to have a nodular opacity in the lingular segment of the left upper lobe.  The differential diagnosis includes lung cancer, as well as infectious or inflammatory nodules.  PET/CT showed only low level FDG uptake which did not rule in or out any of the possibilities.  She has  some scarring in that area and in the setting of her recurrent respiratory infections this could be a small focal granuloma or area of BOOP.  I reviewed the CT and PET/CT images with Mr. Mandy Delacruz.  We discussed the diagnosis.  They understand that the relatively low level activity on PET decreases the suspicion of this being a cancer but does not rule out the possibility.  However if it  is malignant likely is a very low-grade tumor.  We discussed potential options.  This is not amenable to bronchoscopic biopsy.  It could potentially be biopsied under CT, but is relatively small and may be subject to significant respiratory variation.  A wedge resection is also an option.  However given the absence of significant uptake on PET CT I do not see a compelling reason to operate at this point.  I recommended that we repeat a CT in 3 months.  If the opacity has improved or resolved no further work-up would be necessary.  If stable or increased in size will discuss surgery versus biopsy again at that time.  She does have a heart murmur on exam and her CT and PET show extensive calcification in the aortic root and possibly also the valve.  We will get a get a 2D echocardiogram to assess the aortic valve provide a baseline for follow-up.  Aortic atherosclerosis-importance of smoking cessation emphasized.  Tobacco abuse-emphasized importance of tobacco cessation  Plan: 2D echocardiogram Return in 3 months with repeat CT chest  Melrose Nakayama, MD [facility] [phone]

## 2017-09-02 ENCOUNTER — Other Ambulatory Visit: Payer: Self-pay | Admitting: Thoracic Surgery (Cardiothoracic Vascular Surgery)

## 2017-09-02 DIAGNOSIS — I359 Nonrheumatic aortic valve disorder, unspecified: Secondary | ICD-10-CM

## 2017-09-02 DIAGNOSIS — R011 Cardiac murmur, unspecified: Secondary | ICD-10-CM

## 2017-09-04 ENCOUNTER — Ambulatory Visit: Payer: BLUE CROSS/BLUE SHIELD

## 2017-09-08 ENCOUNTER — Ambulatory Visit
Admission: RE | Admit: 2017-09-08 | Discharge: 2017-09-08 | Disposition: A | Discharge status: Home / Self Care | Payer: BLUE CROSS/BLUE SHIELD | Source: Ambulatory Visit | Attending: Thoracic Surgery (Cardiothoracic Vascular Surgery) | Admitting: Thoracic Surgery (Cardiothoracic Vascular Surgery)

## 2017-09-08 DIAGNOSIS — R011 Cardiac murmur, unspecified: Secondary | ICD-10-CM | POA: Diagnosis not present

## 2017-09-08 DIAGNOSIS — I359 Nonrheumatic aortic valve disorder, unspecified: Secondary | ICD-10-CM | POA: Diagnosis not present

## 2017-09-08 DIAGNOSIS — I083 Combined rheumatic disorders of mitral, aortic and tricuspid valves: Secondary | ICD-10-CM | POA: Insufficient documentation

## 2017-09-08 NOTE — Progress Notes (Signed)
*  PRELIMINARY RESULTS* Echocardiogram 2D Echocardiogram has been performed.  Mandy Delacruz 09/08/2017, 11:07 AM

## 2017-09-22 DIAGNOSIS — Z23 Encounter for immunization: Secondary | ICD-10-CM | POA: Diagnosis not present

## 2017-09-22 DIAGNOSIS — E7800 Pure hypercholesterolemia, unspecified: Secondary | ICD-10-CM | POA: Diagnosis not present

## 2017-09-22 DIAGNOSIS — R7303 Prediabetes: Secondary | ICD-10-CM | POA: Diagnosis not present

## 2017-10-16 ENCOUNTER — Other Ambulatory Visit: Payer: Self-pay | Admitting: *Deleted

## 2017-10-16 DIAGNOSIS — R911 Solitary pulmonary nodule: Secondary | ICD-10-CM

## 2017-10-27 DIAGNOSIS — H9212 Otorrhea, left ear: Secondary | ICD-10-CM | POA: Diagnosis not present

## 2017-10-27 DIAGNOSIS — I350 Nonrheumatic aortic (valve) stenosis: Secondary | ICD-10-CM | POA: Diagnosis not present

## 2017-10-27 DIAGNOSIS — E1165 Type 2 diabetes mellitus with hyperglycemia: Secondary | ICD-10-CM | POA: Diagnosis not present

## 2017-11-10 ENCOUNTER — Ambulatory Visit: Payer: BLUE CROSS/BLUE SHIELD | Admitting: Thoracic Surgery (Cardiothoracic Vascular Surgery)

## 2017-11-17 ENCOUNTER — Encounter: Payer: BLUE CROSS/BLUE SHIELD | Attending: Family Medicine | Admitting: Dietician

## 2017-11-17 ENCOUNTER — Encounter: Payer: Self-pay | Admitting: Dietician

## 2017-11-17 VITALS — BP 168/88 | Ht 65.0 in | Wt 160.0 lb

## 2017-11-17 DIAGNOSIS — Z713 Dietary counseling and surveillance: Secondary | ICD-10-CM | POA: Insufficient documentation

## 2017-11-17 DIAGNOSIS — E1165 Type 2 diabetes mellitus with hyperglycemia: Secondary | ICD-10-CM | POA: Insufficient documentation

## 2017-11-17 DIAGNOSIS — Z6826 Body mass index (BMI) 26.0-26.9, adult: Secondary | ICD-10-CM | POA: Diagnosis not present

## 2017-11-17 DIAGNOSIS — E119 Type 2 diabetes mellitus without complications: Secondary | ICD-10-CM

## 2017-11-17 NOTE — Patient Instructions (Addendum)
  Check blood sugars 2 x day before breakfast and 2 hrs after supper every day  Bring blood sugar records to the next appointment/class  Call your doctor for a prescription for:  1. Meter strips (type) One Touch Verio test strips checking  2 times per day  2. Lancets (type) One Touch Delica lancets checking  2   times per day  Exercise:  Start walking on treadmill 15-20 min. 2x/wk.; consider doing aerobic video  Eat 3 meals day and 1-2 snacks/day at bedtime and afternoon if needed  Space meals 4-6 hours apart  Eat 2-3 carbohydrate servings/meal + protein  Eat 1 carbohydrate serving/snack + protein  Avoid sugar sweetened drinks (soda, tea, coffee, sports drinks, juices)  Continue to drink plenty of water  Limit intake of fried foods, sweets and snack foods  Make an eye doctor appointment  Get a Sharps container  Return for appointment/classes on:  12-17-17

## 2017-11-17 NOTE — Progress Notes (Signed)
Diabetes Self-Management Education  Visit Type: First/Initial  Appt. Start Time: 1100 Appt. End Time: 1200  11/17/2017  Mandy Delacruz, identified by name and date of birth, is a 72 y.o. female with a diagnosis of Diabetes: Type 2.   ASSESSMENT  Blood pressure (!) 168/88, height 5\' 5"  (1.651 m), weight 160 lb (72.6 kg). Body mass index is 26.63 kg/m.  Diabetes Self-Management Education - 11/17/17 1212      Visit Information   Visit Type  First/Initial      Initial Visit   Diabetes Type  Type 2      Health Coping   How would you rate your overall health?  Good      Psychosocial Assessment   Patient Belief/Attitude about Diabetes  Motivated to manage diabetes    Self-care barriers  None    Self-management support  Doctor's office;Family    Other persons present  Patient    Patient Concerns  Medication;Glycemic Control;Weight Control   prevent complications   Special Needs  None    Preferred Learning Style  Auditory    Learning Readiness  Ready    What is the last grade level you completed in school?  college 2 years      Pre-Education Assessment   Patient understands the diabetes disease and treatment process.  Needs Instruction    Patient understands incorporating nutritional management into lifestyle.  Needs Instruction    Patient undertands incorporating physical activity into lifestyle.  Needs Instruction    Patient understands using medications safely.  Needs Instruction    Patient understands monitoring blood glucose, interpreting and using results  Needs Review   has One Touch Verio flex meter but is out of test strips-has not tested since 10-14-17   Patient understands prevention, detection, and treatment of acute complications.  Needs Instruction    Patient understands prevention, detection, and treatment of chronic complications.  Needs Instruction    Patient understands how to develop strategies to address psychosocial issues.  Needs Instruction    Patient  understands how to develop strategies to promote health/change behavior.  Needs Instruction      Complications   Last HgB A1C per patient/outside source  7.6 %    How often do you check your blood sugar?  0 times/day (not testing)    Have you had a dilated eye exam in the past 12 months?  Yes   12-2016   Have you had a dental exam in the past 12 months?  Yes   10-2017   Are you checking your feet?  No      Dietary Intake   Breakfast  eats breakfast at 7a-8a    Snack (morning)  none    Lunch  eats lunch at 12:30-1:30p; eats fried foods 2-3x/wk; intolerant to bananas, cucumbers, cantelope    Snack (afternoon)  eats chips, nuts, cookies, sweets at 4p    Dinner  eats supper at 6:30p    Snack (evening)  eats chips, nuts, cookies, sweets at 10p    Beverage(s)  drinks water 8+x/day, 1-2 regular soda &/or 1/2 sweetened tea/day      Exercise   Exercise Type  ADL's      Patient Education   Previous Diabetes Education  No    Disease state   Definition of diabetes, type 1 and 2, and the diagnosis of diabetes;Explored patient's options for treatment of their diabetes    Nutrition management   Role of diet in the treatment of diabetes and the  relationship between the three main macronutrients and blood glucose level;Food label reading, portion sizes and measuring food.;Carbohydrate counting    Physical activity and exercise   Role of exercise on diabetes management, blood pressure control and cardiac health.;Helped patient identify appropriate exercises in relation to his/her diabetes, diabetes complications and other health issue.    Monitoring  Purpose and frequency of SMBG.;Taught/evaluated SMBG meter.;Taught/discussed recording of test results and interpretation of SMBG.;Identified appropriate SMBG and/or A1C goals.;Yearly dilated eye exam    Chronic complications  Relationship between chronic complications and blood glucose control;Dental care;Retinopathy and reason for yearly dilated eye  exams;Reviewed with patient heart disease, higher risk of, and prevention;Nephropathy, what it is, prevention of, the use of ACE, ARB's and early detection of through urine microalbumia.;Lipid levels, blood glucose control and heart disease    Personal strategies to promote health  Lifestyle issues that need to be addressed for better diabetes care;Helped patient develop diabetes management plan for (enter comment)      Outcomes   Expected Outcomes  Demonstrated interest in learning. Expect positive outcomes       Individualized Plan for Diabetes Self-Management Training:   Learning Objective:  Patient will have a greater understanding of diabetes self-management. Patient education plan is to attend individual and/or group sessions per assessed needs and concerns.   Plan:   Patient Instructions   Check blood sugars 2 x day before breakfast and 2 hrs after supper every day  Bring blood sugar records to the next appointment/class  Call your doctor for a prescription for:  1. Meter strips (type) One Touch Verio test strips checking  2 times per day  2. Lancets (type)One Touch Delica lancets checking  2  times per day  Exercise:  Start walking on treadmill 15-20 min. 2x/wk.; consider doing aerobic video  Eat 3 meals day and 1-2 snacks/day at bedtime and afternoon if needed  Space meals 4-6 hours apart  Eat 2-3 carbohydrate servings/meal + protein  Eat 1 carbohydrate serving/snack + protein  Avoid sugar sweetened drinks (soda, tea, coffee, sports drinks, juices)  Continue to drink plenty of water  Limit intake of fried foods, sweets and snack foods  Make an eye doctor appointment  Get a Sharps container  Return for appointment/classes on:  12-17-17   Expected Outcomes:  Demonstrated interest in learning. Expect positive outcomes  Education material provided: General meal planning guidelines, Food Group handout If problems or questions, patient to contact team via:   [phone]  Future DSME appointment:  12-17-17

## 2017-12-08 ENCOUNTER — Ambulatory Visit (INDEPENDENT_AMBULATORY_CARE_PROVIDER_SITE_OTHER): Payer: BLUE CROSS/BLUE SHIELD | Admitting: Thoracic Surgery (Cardiothoracic Vascular Surgery)

## 2017-12-08 ENCOUNTER — Ambulatory Visit
Admission: RE | Admit: 2017-12-08 | Discharge: 2017-12-08 | Disposition: A | Discharge status: Home / Self Care | Payer: BLUE CROSS/BLUE SHIELD | Source: Ambulatory Visit | Attending: Thoracic Surgery (Cardiothoracic Vascular Surgery) | Admitting: Thoracic Surgery (Cardiothoracic Vascular Surgery)

## 2017-12-08 ENCOUNTER — Encounter: Payer: Self-pay | Admitting: Thoracic Surgery (Cardiothoracic Vascular Surgery)

## 2017-12-08 VITALS — BP 175/79 | HR 78 | Resp 20 | Ht 65.0 in | Wt 158.0 lb

## 2017-12-08 DIAGNOSIS — J984 Other disorders of lung: Secondary | ICD-10-CM | POA: Diagnosis not present

## 2017-12-08 DIAGNOSIS — I350 Nonrheumatic aortic (valve) stenosis: Secondary | ICD-10-CM | POA: Diagnosis not present

## 2017-12-08 DIAGNOSIS — R911 Solitary pulmonary nodule: Secondary | ICD-10-CM

## 2017-12-08 NOTE — Progress Notes (Signed)
[address]       [place] [address]             [phone]      HPI: Mandy Delacruz returns for scheduled follow-up visit  Mandy Delacruz is a 72 year old woman with a history of tobacco abuse, COPD, chronic bronchitis, asthma, hypertension, hiatal hernia, reflux, and hernias.  She had been having recurrent bouts of bronchitis dating back about a year.  She had been treated with antibiotics multiple times.  She had a CT in August which showed a nodular density in the lingula.  On PET CT there was minimal uptake in that nodule.  There were multiple other inflammatory appearing nodules throughout the lungs.  She continues to smoke about 4 to 5 cigarettes a day.  She has not been able to quit altogether.  She is very anxious about the results of her CT.  Past Medical History:  Diagnosis Date  . Asthma   . Bronchitis    on bactrin since 12-27-14  . Colon polyps 2001  . Diverticulosis of colon (without mention of hemorrhage) 2001  . Dizziness    inner ear problem at times, saw dr  [name] for  . GERD (gastroesophageal reflux disease)   . Heart murmur   . Hiatal hernia 2001  . Hyperlipidemia   . Hypertension   . IH (inguinal hernia)    left  . Other esophagitis 2001    Current Outpatient Medications  Medication Sig Dispense Refill  . Albuterol Sulfate (PROAIR HFA IN) Inhale 2 puffs into the lungs 4 (four) times daily as needed (wheezing).     . diphenhydrAMINE (BENADRYL) 25 MG tablet Take 25 mg by mouth at bedtime.     Marland Kitchen ibuprofen (ADVIL,MOTRIN) 200 MG tablet Take 400 mg by mouth every morning.     Marland Kitchen losartan-hydrochlorothiazide (HYZAAR) 50-12.5 MG tablet Take 1 tablet by mouth daily.  0  . nicotine polacrilex (NICORETTE) 4 MG gum Take 4 mg by mouth as needed for smoking cessation.    Marland Kitchen omeprazole (PRILOSEC OTC) 20 MG tablet Take 20 mg by mouth at bedtime.     . SYMBICORT 80-4.5 MCG/ACT inhaler Take 2 puffs by mouth daily.  11  . Turmeric 500 MG TABS Take  1,000 mg by mouth daily.     No current facility-administered medications for this visit.     Physical Exam BP (!) 175/79   Pulse 78   Resp 20   Ht 5\' 5"  (1.651 m)   Wt 158 lb (71.7 kg)   SpO2 99% Comment: RA  BMI 26.90 kg/m  72 year old woman in no acute distress Alert and oriented x3 with no focal deficits Cardiac regular rate and rhythm with a 2/6 systolic murmur Lungs clear bilaterally  Diagnostic Tests: Echocardiogram 09/08/2017 Study Conclusions  - Left ventricle: The cavity size was normal. There was moderate   concentric hypertrophy. Systolic function was normal. The   estimated ejection fraction was in the range of 55% to 65%. - Aortic valve: There was mild to moderate stenosis. There was mild   regurgitation. Valve area (VTI): 1.15 cm^2. Valve area (Vmax):   1.2 cm^2. - Mitral valve: There was mild regurgitation. Valve area by   pressure half-time: 2.12 cm^2. Valve area by continuity equation   (using LVOT flow): 2.44 cm^2. CT CHEST WITHOUT CONTRAST  TECHNIQUE: Multidetector CT imaging of the chest was performed following the standard protocol without IV contrast.  COMPARISON:  08/14/2017 CT.  08/31/2017 PET  FINDINGS: Cardiovascular: Aortic and branch vessel atherosclerosis. Tortuous thoracic aorta. Normal heart size, without pericardial effusion. Multivessel coronary artery atherosclerosis. Aortic valve calcification.  Mediastinum/Nodes: No mediastinal or definite hilar adenopathy, given limitations of unenhanced CT.  Lungs/Pleura: No pleural fluid.  Mild centrilobular emphysema.  The lingular area of pleural-based consolidation is significantly improved. Example at 1.0 x 0.9 cm on image 96/8 versus 1.5 x 1.5 cm on 08/14/2017. Mild surrounding ground-glass opacity.  Near complete resolution of anteromedial right upper lobe airspace disease with minimal interstitial thickening remaining.  A pleural-based left lower lobe pulmonary nodule of  6 mm on image 78/8 is similar back to 2015 and can be presumed benign.  Clustered nodules within the right upper and right lower lobes, on the order of 5 mm or less, are similar to on the prior exam and favored to be post infectious or inflammatory. Some smaller peribronchovascular nodules including in the right lower lobe on the prior have resolved.  Upper Abdomen: Mild hepatic steatosis. Normal imaged portions of the spleen, stomach, pancreas, left kidney. Mild adrenal thickening bilaterally.  Musculoskeletal: Cervical/thoracic spondylosis.  IMPRESSION: 1. Further improvement and resolution of infectious opacities bilaterally. 2. Improvement in peribronchovascular nodularity, especially in the right lower lobe. This is also likely infectious. There are remaining clustered right upper and right lower lobe nodules which are technically indeterminate. Given emphysema and smoking history, consider follow-up with chest CT at 1 year. This recommendation follows the consensus statement: Guidelines for Management of Incidental Pulmonary Nodules Detected on CT Images:From the Fleischner Society 2017; published online before print (10.1148/radiol.8527782423). 3. Aortic atherosclerosis (ICD10-I70.0), coronary artery atherosclerosis and emphysema (ICD10-J43.9). 4. Hepatic steatosis. 5. Aortic valve calcifications may represent valvular disease. Consider echocardiography.   Electronically Signed   By: Abigail Miyamoto M.D.   On: 12/08/2017 10:34\ I personally viewed the CT images and concur with the findings noted above  Impression: Mandy Delacruz is a 72 year old woman with a history of tobacco abuse, COPD, chronic bronchitis, multiple lung nodules, heart murmur, hypertension, hiatal hernia, and reflux.  Multiple lung nodules-interval follow-up CT at 3 months shows improvement in the nodules and peribronchovascular nodularity.  In particular the lingular nodule has decreased in size from  1.5 to 1 cm.  I recommended that we repeat a CT scan in 6 months to follow-up the nodules.  If stable at that time she would need annual follow-up.  Heart murmur-echocardiogram showed mild to moderate aortic stenosis.  No indication for intervention currently, but needs to be followed.  She will need another echocardiogram in a year.  She does not want to be referred to a cardiologist at this time.  Hypertension-blood pressure markedly elevated.  She was very anxious about the CT scan.  She says she is checking it at home and it is better than that.  I recommended she follow-up with her primary physician  Plan: Return in 6 months with CT of chest to follow-up lung nodules.  We will need repeat echocardiogram in 1 year  Melrose Nakayama, MD [facility] [phone]

## 2017-12-17 ENCOUNTER — Encounter: Payer: Self-pay | Admitting: *Deleted

## 2017-12-17 ENCOUNTER — Encounter: Payer: BLUE CROSS/BLUE SHIELD | Attending: Family Medicine | Admitting: *Deleted

## 2017-12-17 VITALS — Wt 157.4 lb

## 2017-12-17 DIAGNOSIS — Z713 Dietary counseling and surveillance: Secondary | ICD-10-CM | POA: Diagnosis not present

## 2017-12-17 DIAGNOSIS — E119 Type 2 diabetes mellitus without complications: Secondary | ICD-10-CM

## 2017-12-17 DIAGNOSIS — Z6826 Body mass index (BMI) 26.0-26.9, adult: Secondary | ICD-10-CM | POA: Diagnosis not present

## 2017-12-17 DIAGNOSIS — E1165 Type 2 diabetes mellitus with hyperglycemia: Secondary | ICD-10-CM | POA: Diagnosis not present

## 2017-12-17 NOTE — Progress Notes (Signed)

## 2017-12-24 ENCOUNTER — Encounter: Payer: BLUE CROSS/BLUE SHIELD | Admitting: *Deleted

## 2017-12-24 ENCOUNTER — Encounter: Payer: Self-pay | Admitting: *Deleted

## 2017-12-24 VITALS — Wt 155.2 lb

## 2017-12-24 DIAGNOSIS — E1165 Type 2 diabetes mellitus with hyperglycemia: Secondary | ICD-10-CM | POA: Diagnosis not present

## 2017-12-24 DIAGNOSIS — Z713 Dietary counseling and surveillance: Secondary | ICD-10-CM | POA: Diagnosis not present

## 2017-12-24 DIAGNOSIS — E119 Type 2 diabetes mellitus without complications: Secondary | ICD-10-CM

## 2017-12-24 DIAGNOSIS — Z6826 Body mass index (BMI) 26.0-26.9, adult: Secondary | ICD-10-CM | POA: Diagnosis not present

## 2017-12-24 NOTE — Progress Notes (Signed)

## 2017-12-31 ENCOUNTER — Encounter: Payer: Self-pay | Admitting: Dietician

## 2017-12-31 ENCOUNTER — Encounter: Payer: BLUE CROSS/BLUE SHIELD | Admitting: Dietician

## 2017-12-31 VITALS — BP 156/74 | Ht 65.0 in | Wt 154.6 lb

## 2017-12-31 DIAGNOSIS — Z713 Dietary counseling and surveillance: Secondary | ICD-10-CM | POA: Diagnosis not present

## 2017-12-31 DIAGNOSIS — Z6826 Body mass index (BMI) 26.0-26.9, adult: Secondary | ICD-10-CM | POA: Diagnosis not present

## 2017-12-31 DIAGNOSIS — E119 Type 2 diabetes mellitus without complications: Secondary | ICD-10-CM

## 2017-12-31 DIAGNOSIS — E1165 Type 2 diabetes mellitus with hyperglycemia: Secondary | ICD-10-CM | POA: Diagnosis not present

## 2017-12-31 NOTE — Progress Notes (Signed)

## 2018-01-04 ENCOUNTER — Encounter: Payer: Self-pay | Admitting: Dietician

## 2018-01-19 DIAGNOSIS — J449 Chronic obstructive pulmonary disease, unspecified: Secondary | ICD-10-CM | POA: Diagnosis not present

## 2018-01-19 DIAGNOSIS — E1169 Type 2 diabetes mellitus with other specified complication: Secondary | ICD-10-CM | POA: Diagnosis not present

## 2018-01-19 DIAGNOSIS — E7800 Pure hypercholesterolemia, unspecified: Secondary | ICD-10-CM | POA: Diagnosis not present

## 2018-01-19 DIAGNOSIS — I10 Essential (primary) hypertension: Secondary | ICD-10-CM | POA: Diagnosis not present

## 2018-03-08 DIAGNOSIS — J45909 Unspecified asthma, uncomplicated: Secondary | ICD-10-CM | POA: Diagnosis not present

## 2018-03-15 DIAGNOSIS — J449 Chronic obstructive pulmonary disease, unspecified: Secondary | ICD-10-CM | POA: Diagnosis not present

## 2018-04-12 ENCOUNTER — Other Ambulatory Visit: Payer: Self-pay | Admitting: Thoracic Surgery (Cardiothoracic Vascular Surgery)

## 2018-04-12 DIAGNOSIS — R911 Solitary pulmonary nodule: Secondary | ICD-10-CM

## 2018-05-14 DIAGNOSIS — J449 Chronic obstructive pulmonary disease, unspecified: Secondary | ICD-10-CM | POA: Diagnosis not present

## 2018-05-14 DIAGNOSIS — Z0000 Encounter for general adult medical examination without abnormal findings: Secondary | ICD-10-CM | POA: Diagnosis not present

## 2018-05-14 DIAGNOSIS — E7800 Pure hypercholesterolemia, unspecified: Secondary | ICD-10-CM | POA: Diagnosis not present

## 2018-05-14 DIAGNOSIS — I10 Essential (primary) hypertension: Secondary | ICD-10-CM | POA: Diagnosis not present

## 2018-06-22 ENCOUNTER — Other Ambulatory Visit: Payer: BLUE CROSS/BLUE SHIELD

## 2018-06-22 ENCOUNTER — Encounter: Payer: BLUE CROSS/BLUE SHIELD | Admitting: Thoracic Surgery (Cardiothoracic Vascular Surgery)

## 2018-09-28 DIAGNOSIS — M79672 Pain in left foot: Secondary | ICD-10-CM | POA: Diagnosis not present

## 2018-09-28 DIAGNOSIS — M25552 Pain in left hip: Secondary | ICD-10-CM | POA: Diagnosis not present

## 2018-11-18 DIAGNOSIS — J449 Chronic obstructive pulmonary disease, unspecified: Secondary | ICD-10-CM | POA: Diagnosis not present

## 2018-11-18 DIAGNOSIS — I10 Essential (primary) hypertension: Secondary | ICD-10-CM | POA: Diagnosis not present

## 2018-11-18 DIAGNOSIS — E7800 Pure hypercholesterolemia, unspecified: Secondary | ICD-10-CM | POA: Diagnosis not present

## 2018-11-18 DIAGNOSIS — Z23 Encounter for immunization: Secondary | ICD-10-CM | POA: Diagnosis not present

## 2018-11-18 DIAGNOSIS — E1169 Type 2 diabetes mellitus with other specified complication: Secondary | ICD-10-CM | POA: Diagnosis not present

## 2018-12-31 DIAGNOSIS — Z20828 Contact with and (suspected) exposure to other viral communicable diseases: Secondary | ICD-10-CM | POA: Diagnosis not present

## 2019-02-03 ENCOUNTER — Ambulatory Visit: Payer: BC Managed Care – PPO | Attending: Internal Medicine

## 2019-02-03 DIAGNOSIS — Z23 Encounter for immunization: Secondary | ICD-10-CM | POA: Diagnosis not present

## 2019-02-03 NOTE — Progress Notes (Signed)
   Covid-19 Vaccination Clinic  Name:  Mandy Delacruz    MRN: [id number] DOB: 1945/03/22  02/03/2019  [name] was observed post Covid-19 immunization for 15 minutes without incidence. She was provided with Vaccine Information Sheet and instruction to access the V-Safe system.   [name] was instructed to call 911 with any severe reactions post vaccine: Marland Kitchen Difficulty breathing  . Swelling of your face and throat  . A fast heartbeat  . A bad rash all over your body  . Dizziness and weakness    Immunizations Administered    Name Date Dose VIS Date Route   Pfizer COVID-19 Vaccine 02/03/2019 11:49 AM 0.3 mL 12/24/2018 Intramuscular   Manufacturer: [place]   Lot: [id number]   [place]: [id number]

## 2019-02-13 ENCOUNTER — Ambulatory Visit: Payer: BLUE CROSS/BLUE SHIELD

## 2019-02-24 ENCOUNTER — Ambulatory Visit: Payer: BC Managed Care – PPO | Attending: Internal Medicine

## 2019-02-24 DIAGNOSIS — Z23 Encounter for immunization: Secondary | ICD-10-CM | POA: Insufficient documentation

## 2019-02-24 NOTE — Progress Notes (Signed)
   Covid-19 Vaccination Clinic  Name:  Mandy Delacruz    MRN: [id number] DOB: 10-30-1945  02/24/2019  [name] was observed post Covid-19 immunization for 15 minutes without incidence. She was provided with Vaccine Information Sheet and instruction to access the V-Safe system.   [name] was instructed to call 911 with any severe reactions post vaccine: Marland Kitchen Difficulty breathing  . Swelling of your face and throat  . A fast heartbeat  . A bad rash all over your body  . Dizziness and weakness    Immunizations Administered    Name Date Dose VIS Date Route   Pfizer COVID-19 Vaccine 02/24/2019 11:25 AM 0.3 mL 12/24/2018 Intramuscular   Manufacturer: [place]   Lot: [id number]   [place]: [id number]

## 2019-03-05 ENCOUNTER — Ambulatory Visit: Payer: BLUE CROSS/BLUE SHIELD

## 2019-03-06 ENCOUNTER — Ambulatory Visit: Payer: BLUE CROSS/BLUE SHIELD

## 2019-03-19 IMAGING — PT NM PET TUM IMG INITIAL (PI) SKULL BASE T - THIGH
1 of 9 series · 1 of 25 positions shown · non-contrast
Comparison: CT scan 08/14/2017

CLINICAL DATA: Initial treatment strategy for pulmonary nodule.

EXAM:
NUCLEAR MEDICINE PET SKULL BASE TO THIGH
TECHNIQUE: 8.1 mCi F-18 FDG was injected intravenously. Full-ring PET imaging
was performed from the skull base to thigh after the radiotracer. CT
data was obtained and used for attenuation correction and anatomic
localization.
Fasting blood glucose: 140 mg/dl

[Series 3: ct wb 5.0 b30f · axial · 5.0mm · 0.98mm/px · 1 of 290 slices shown]
[im 290/290  brain]
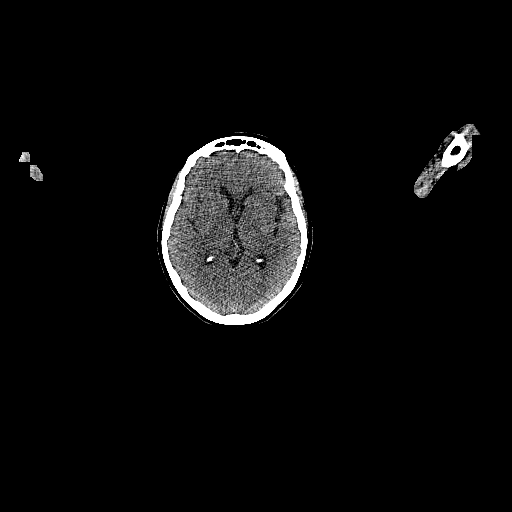

[1 of 25 positions shown; findings below may reference images not displayed]

FINDINGS: Mediastinal blood pool activity: SUV max

NECK: No hypermetabolic lymph nodes in the neck.

Incidental CT findings: none

CHEST: No hypermetabolic hilar, axillary, or mediastinal
lymphadenopathy. The pleural-based masslike opacity described on the
recent chest CT shows only low level FDG uptake with SUV max = 1.7.
No hypermetabolism is identified in the scattered ground-glass
opacity seen on the recent diagnostic chest CT. Other non
ground-glass nodules seen on the previous CT are below the threshold
for reliable resolution on PET imaging.

Incidental CT findings: Atherosclerotic calcification is noted in
the wall of the thoracic aorta.

ABDOMEN/PELVIS: No abnormal hypermetabolic activity within the
liver, pancreas, adrenal glands, or spleen. No hypermetabolic lymph
nodes in the abdomen or pelvis.

Incidental CT findings: Uptake is identified along the anterior
abdominal wall in the region of the umbilicus. Patient is status
post umbilical hernia repair on 08/18/2017. [DATE] x 7.4 cm fluid
collection is identified just deep to the anterior abdominal wall in
the region of the recent surgery.

Diverticular changes are noted in the left colon.

SKELETON: No focal hypermetabolic activity to suggest skeletal
metastasis.

Incidental CT findings: none
IMPRESSION: 1. Low level FDG accumulation in the previously described
pleural-based masslike opacity in the lingula. Imaging features may
be infectious/inflammatory in etiology although low-grade or well
differentiated neoplasm is not entirely excluded. Continued
attention will be required.
2. No malignant range hypermetabolism identified in the additional
ground-glass and solid lung nodules identified on previous
diagnostic chest CT. Surveillance based on recommendations on the
report for 08/14/2017 exam recommended.
3. Low level FDG accumulation in the anterior abdominal wall in the
region of the umbilicus, compatible with recent umbilical hernia
repair. 3 x 7 cm fluid collection in the region [DATE]
represent postoperative seroma or hematoma.
4.  Aortic Atherosclerois ([id number])

## 2019-05-27 DIAGNOSIS — M810 Age-related osteoporosis without current pathological fracture: Secondary | ICD-10-CM | POA: Diagnosis not present

## 2019-05-27 DIAGNOSIS — K219 Gastro-esophageal reflux disease without esophagitis: Secondary | ICD-10-CM | POA: Diagnosis not present

## 2019-05-27 DIAGNOSIS — Z0000 Encounter for general adult medical examination without abnormal findings: Secondary | ICD-10-CM | POA: Diagnosis not present

## 2019-05-27 DIAGNOSIS — E7800 Pure hypercholesterolemia, unspecified: Secondary | ICD-10-CM | POA: Diagnosis not present

## 2019-05-27 DIAGNOSIS — E1169 Type 2 diabetes mellitus with other specified complication: Secondary | ICD-10-CM | POA: Diagnosis not present

## 2019-05-27 DIAGNOSIS — I129 Hypertensive chronic kidney disease with stage 1 through stage 4 chronic kidney disease, or unspecified chronic kidney disease: Secondary | ICD-10-CM | POA: Diagnosis not present

## 2019-06-01 ENCOUNTER — Other Ambulatory Visit: Payer: Self-pay | Admitting: Family Medicine

## 2019-06-02 ENCOUNTER — Other Ambulatory Visit: Payer: Self-pay | Admitting: Family Medicine

## 2019-06-02 DIAGNOSIS — Z1231 Encounter for screening mammogram for malignant neoplasm of breast: Secondary | ICD-10-CM

## 2019-06-02 DIAGNOSIS — M810 Age-related osteoporosis without current pathological fracture: Secondary | ICD-10-CM

## 2019-06-06 DIAGNOSIS — S90862A Insect bite (nonvenomous), left foot, initial encounter: Secondary | ICD-10-CM | POA: Diagnosis not present

## 2019-06-17 DIAGNOSIS — B078 Other viral warts: Secondary | ICD-10-CM | POA: Diagnosis not present

## 2019-06-17 DIAGNOSIS — L570 Actinic keratosis: Secondary | ICD-10-CM | POA: Diagnosis not present

## 2019-06-17 DIAGNOSIS — D225 Melanocytic nevi of trunk: Secondary | ICD-10-CM | POA: Diagnosis not present

## 2019-06-17 DIAGNOSIS — X32XXXA Exposure to sunlight, initial encounter: Secondary | ICD-10-CM | POA: Diagnosis not present

## 2019-06-17 DIAGNOSIS — Z1283 Encounter for screening for malignant neoplasm of skin: Secondary | ICD-10-CM | POA: Diagnosis not present

## 2019-10-11 ENCOUNTER — Other Ambulatory Visit: Payer: Self-pay | Admitting: Family Medicine

## 2019-10-11 DIAGNOSIS — R911 Solitary pulmonary nodule: Secondary | ICD-10-CM

## 2019-11-28 ENCOUNTER — Other Ambulatory Visit: Payer: Self-pay | Admitting: Thoracic Surgery (Cardiothoracic Vascular Surgery)

## 2019-11-28 DIAGNOSIS — I129 Hypertensive chronic kidney disease with stage 1 through stage 4 chronic kidney disease, or unspecified chronic kidney disease: Secondary | ICD-10-CM | POA: Diagnosis not present

## 2019-11-28 DIAGNOSIS — E7800 Pure hypercholesterolemia, unspecified: Secondary | ICD-10-CM | POA: Diagnosis not present

## 2019-11-28 DIAGNOSIS — E1169 Type 2 diabetes mellitus with other specified complication: Secondary | ICD-10-CM | POA: Diagnosis not present

## 2019-11-28 DIAGNOSIS — N1831 Chronic kidney disease, stage 3a: Secondary | ICD-10-CM | POA: Diagnosis not present

## 2019-11-28 DIAGNOSIS — I350 Nonrheumatic aortic (valve) stenosis: Secondary | ICD-10-CM

## 2019-12-26 ENCOUNTER — Ambulatory Visit (HOSPITAL_COMMUNITY): Payer: BC Managed Care – PPO | Attending: Cardiology

## 2019-12-26 ENCOUNTER — Other Ambulatory Visit: Payer: Self-pay

## 2019-12-26 ENCOUNTER — Ambulatory Visit
Admission: RE | Admit: 2019-12-26 | Discharge: 2019-12-26 | Disposition: A | Discharge status: Home / Self Care | Payer: BC Managed Care – PPO | Source: Ambulatory Visit | Attending: Family Medicine | Admitting: Family Medicine

## 2019-12-26 DIAGNOSIS — I350 Nonrheumatic aortic (valve) stenosis: Secondary | ICD-10-CM

## 2019-12-26 DIAGNOSIS — R911 Solitary pulmonary nodule: Secondary | ICD-10-CM

## 2019-12-26 DIAGNOSIS — J984 Other disorders of lung: Secondary | ICD-10-CM | POA: Diagnosis not present

## 2019-12-26 DIAGNOSIS — J439 Emphysema, unspecified: Secondary | ICD-10-CM | POA: Diagnosis not present

## 2019-12-26 DIAGNOSIS — J9811 Atelectasis: Secondary | ICD-10-CM | POA: Diagnosis not present

## 2019-12-26 DIAGNOSIS — I700 Atherosclerosis of aorta: Secondary | ICD-10-CM | POA: Diagnosis not present

## 2019-12-26 LAB — ECHOCARDIOGRAM COMPLETE
AR max vel: 1.23 cm2
AV Area VTI: 1.32 cm2
AV Area mean vel: 1.29 cm2
AV Mean grad: 29.3 mmHg
AV Peak grad: 55.2 mmHg
Ao pk vel: 3.71 m/s
Area-P 1/2: 1.33 cm2
S' Lateral: 1.4 cm

## 2019-12-26 MED ORDER — PERFLUTREN LIPID MICROSPHERE
1.0000 mL | INTRAVENOUS | Status: AC | PRN
  Administered 2019-12-26: 1 mL via INTRAVENOUS

## 2020-01-10 ENCOUNTER — Ambulatory Visit: Payer: BC Managed Care – PPO | Admitting: Thoracic Surgery (Cardiothoracic Vascular Surgery)

## 2020-01-31 ENCOUNTER — Ambulatory Visit (INDEPENDENT_AMBULATORY_CARE_PROVIDER_SITE_OTHER): Payer: BC Managed Care – PPO | Admitting: Thoracic Surgery (Cardiothoracic Vascular Surgery)

## 2020-01-31 ENCOUNTER — Other Ambulatory Visit: Payer: Self-pay

## 2020-01-31 ENCOUNTER — Encounter: Payer: Self-pay | Admitting: Thoracic Surgery (Cardiothoracic Vascular Surgery)

## 2020-01-31 DIAGNOSIS — Z720 Tobacco use: Secondary | ICD-10-CM | POA: Diagnosis not present

## 2020-01-31 DIAGNOSIS — R911 Solitary pulmonary nodule: Secondary | ICD-10-CM | POA: Diagnosis not present

## 2020-01-31 NOTE — Progress Notes (Signed)
[address]       [place] [address]             [phone]     HPI: [name] returns for follow-up regarding lung nodules and aortic stenosis.  Mandy Delacruz is a 75 year old woman with a history of tobacco abuse, COPD, lung nodules, chronic bronchitis, asthma, hypertension, hiatal hernia, reflux, and aortic stenosis.  She was having multiple bouts of bronchitis back in 2019.  She was treated with antibiotics.  She ultimately had a CT which showed a nodular density in the lingula.  There also was a area of nodular densities in the right upper lobe.  On PET CT there was minimal uptake.  On 39-month follow-up scan there was some improvement.  I recommended that she return in 6 months later, but she was lost to follow-up.  In December she had a repeat CT and an echocardiogram and is here to discuss the results.  She continues to smoke.  She quit on and off but never for more than about a week.  She usually smokes about 5 to 10 cigarettes daily.  She is not having any chest pain, pressure, or tightness.  She is not really having any shortness of breath, but does have occasional wheezing.  She complains of inner ear problems which go back a ways and often result in dizziness.  Past Medical History:  Diagnosis Date  . Asthma   . Bronchitis    on bactrin since 12-27-14  . Colon polyps 2001  . Diverticulosis of colon (without mention of hemorrhage) 2001  . Dizziness    inner ear problem at times, saw dr  [name] for  . GERD (gastroesophageal reflux disease)   . Heart murmur   . Hiatal hernia 2001  . Hyperlipidemia   . Hypertension   . IH (inguinal hernia)    left  . Other esophagitis 2001    Current Outpatient Medications  Medication Sig Dispense Refill  . Albuterol Sulfate (PROAIR HFA IN) Inhale 2 puffs into the lungs 4 (four) times daily as needed (wheezing).     . diphenhydrAMINE (BENADRYL) 25 MG tablet Take 25 mg by mouth at bedtime.    Marland Kitchen ibuprofen  (ADVIL,MOTRIN) 200 MG tablet Take 400 mg by mouth every morning.     Marland Kitchen losartan-hydrochlorothiazide (HYZAAR) 50-12.5 MG tablet Take 1 tablet by mouth daily.  0  . nicotine polacrilex (NICORETTE) 4 MG gum Take 4 mg by mouth as needed for smoking cessation.    Marland Kitchen omeprazole (PRILOSEC OTC) 20 MG tablet Take 20 mg by mouth at bedtime.     . SYMBICORT 80-4.5 MCG/ACT inhaler Take 2 puffs by mouth daily.  11  . Turmeric 500 MG TABS Take 1,000 mg by mouth daily.     No current facility-administered medications for this visit.    Physical Exam BP (!) 161/80 (BP Location: Right Arm, Patient Position: Sitting, Cuff Size: Normal)   Pulse 87   Resp 20   Ht 5' 5.5" (1.664 m)   Wt 147 lb 3.2 oz (66.8 kg)   SpO2 95% Comment: RA  BMI 24.66 kg/m  75 year old woman in no acute distress Alert and oriented x3 with no focal deficits Lungs with expiratory wheezing both bases Cardiac regular rate and rhythm with a 3/6 high-pitched crescendo decrescendo murmur No peripheral edema  Diagnostic Tests: CT CHEST WITHOUT CONTRAST  TECHNIQUE: Multidetector CT imaging of the chest was performed following the standard protocol without  IV contrast.  COMPARISON:  December 08, 2017.  FINDINGS: Cardiovascular: Atherosclerosis of thoracic aorta is noted without aneurysm formation. Normal cardiac size. No pericardial effusion. Calcifications of aortic valve are noted.  Mediastinum/Nodes: No enlarged mediastinal or axillary lymph nodes. Thyroid gland, trachea, and esophagus demonstrate no significant findings.  Lungs/Pleura: No pneumothorax or pleural effusion is noted. Mild emphysematous disease is noted in both upper lobes. New 6 mm nodule is noted in left lung apex best seen on image number 17 of series 8. Stable scarring or subsegmental atelectasis is noted in lingular segment of left upper lobe. Cluster of nodular density seen in the right upper lobe is again noted and not significantly  changed compared to prior exam, and may represent sequela of prior atypical infection.  Upper Abdomen: No acute abnormality.  Musculoskeletal: No chest wall mass or suspicious bone lesions identified.  IMPRESSION: 1. New 6 mm nodule is noted in left lung apex. Non-contrast chest CT at 6-12 months is recommended. If the nodule is stable at time of repeat CT, then future CT at 18-24 months (from today's scan) is considered optional for low-risk patients, but is recommended for high-risk patients. This recommendation follows the consensus statement: Guidelines for Management of Incidental Pulmonary Nodules Detected on CT Images: From the Fleischner Society 2017; Radiology 2017; 284:228-243. 2. Stable scarring or subsegmental atelectasis is noted in lingular segment of left upper lobe. 3. Cluster of nodular density seen in the right upper lobe is again noted and not significantly changed compared to prior exam, and may represent sequela of prior atypical infection. 4. Calcifications of aortic valve are noted. 5. Emphysema and aortic atherosclerosis.  Aortic Atherosclerosis (ICD10-I70.0) and Emphysema (ICD10-J43.9).   Electronically Signed   By: Marijo Conception M.D.   On: 12/26/2019 13:05 I personally reviewed the CT images.  There is a new left upper lobe nodule near the apex.  Other findings are stable.  She does have aortic atherosclerosis and calcification of the aortic valve.  Echocardiogram 12/26/2019 IMPRESSIONS    1. Left ventricular ejection fraction, by estimation, is 60 to 65%. The  left ventricle has normal function. The left ventricle has no regional  wall motion abnormalities. There is moderate left ventricular hypertrophy.  Left ventricular diastolic  parameters are consistent with Grade I diastolic dysfunction (impaired  relaxation).  2. Right ventricular systolic function is normal. The right ventricular  size is normal.  3. The mitral valve is  normal in structure. No evidence of mitral valve  regurgitation. No evidence of mitral stenosis. Moderate mitral annular  calcification.  4. The aortic valve is calcified. There is moderate calcification of the  aortic valve. There is moderate thickening of the aortic valve. Aortic  valve regurgitation is not visualized. Moderate aortic valve stenosis.  Aortic valve area, by VTI measures  1.32 cm. Aortic valve mean gradient measures 29.3 mmHg. Aortic valve Vmax  measures 3.71 m/s.  5. The inferior vena cava is normal in size with greater than 50%  respiratory variability, suggesting right atrial pressure of 3 mmHg.   Impression: Mandy Delacruz is a 75 year old woman with a history of tobacco abuse, COPD, lung nodules, chronic bronchitis, asthma, hypertension, hiatal hernia, reflux, and aortic stenosis.  Lung nodules--prior nodules are stable.  Likely infectious or inflammatory in nature.  There is some scarring in those areas prickly in the lingula.  She has a new 6 mm nodule in the left apex.  She needs a follow-up CT in 6 months for that.  I reviewed the films with her and emphasized the importance of maintaining that follow-up.  Tobacco abuse-again emphasized the importance of tobacco cessation.  Unfortunately I think she is unlikely to quit smoking.  Hypertension-blood pressure elevated.  She says it is because she is nervous about her appointment today.  She is on losartan-hydrochlorothiazide.  Importance of blood pressure control was emphasized.  Aortic stenosis-moderate aortic stenosis with a valve area of 1.3 cm.  Normal left-ventricular function.  I do think she needs a cardiologist.  I will go ahead and make that referral.  Plan: Referral to cardiology Return in 6 months with CT chest  Melrose Nakayama, MD [facility] [phone]

## 2020-02-02 DIAGNOSIS — Z03818 Encounter for observation for suspected exposure to other biological agents ruled out: Secondary | ICD-10-CM | POA: Diagnosis not present

## 2020-03-12 ENCOUNTER — Ambulatory Visit
Admission: RE | Admit: 2020-03-12 | Discharge: 2020-03-12 | Disposition: A | Discharge status: Home / Self Care | Payer: BC Managed Care – PPO | Source: Ambulatory Visit | Attending: Family Medicine | Admitting: Family Medicine

## 2020-03-12 ENCOUNTER — Other Ambulatory Visit: Payer: Self-pay

## 2020-03-12 ENCOUNTER — Ambulatory Visit: Payer: BC Managed Care – PPO | Admitting: Cardiology

## 2020-03-12 DIAGNOSIS — M810 Age-related osteoporosis without current pathological fracture: Secondary | ICD-10-CM

## 2020-03-12 DIAGNOSIS — M8588 Other specified disorders of bone density and structure, other site: Secondary | ICD-10-CM | POA: Diagnosis not present

## 2020-03-12 DIAGNOSIS — Z1231 Encounter for screening mammogram for malignant neoplasm of breast: Secondary | ICD-10-CM

## 2020-03-12 DIAGNOSIS — Z780 Asymptomatic menopausal state: Secondary | ICD-10-CM | POA: Diagnosis not present

## 2020-03-30 ENCOUNTER — Encounter: Payer: Self-pay | Admitting: Cardiovascular Disease

## 2020-03-30 ENCOUNTER — Ambulatory Visit (INDEPENDENT_AMBULATORY_CARE_PROVIDER_SITE_OTHER): Payer: BC Managed Care – PPO | Admitting: Cardiovascular Disease

## 2020-03-30 ENCOUNTER — Other Ambulatory Visit: Payer: Self-pay

## 2020-03-30 VITALS — BP 146/67 | HR 80 | Ht 65.0 in | Wt 147.8 lb

## 2020-03-30 DIAGNOSIS — E785 Hyperlipidemia, unspecified: Secondary | ICD-10-CM

## 2020-03-30 DIAGNOSIS — I208 Other forms of angina pectoris: Secondary | ICD-10-CM | POA: Diagnosis not present

## 2020-03-30 DIAGNOSIS — I10 Essential (primary) hypertension: Secondary | ICD-10-CM | POA: Diagnosis not present

## 2020-03-30 DIAGNOSIS — I350 Nonrheumatic aortic (valve) stenosis: Secondary | ICD-10-CM

## 2020-03-30 DIAGNOSIS — E119 Type 2 diabetes mellitus without complications: Secondary | ICD-10-CM

## 2020-03-30 DIAGNOSIS — J42 Unspecified chronic bronchitis: Secondary | ICD-10-CM

## 2020-03-30 DIAGNOSIS — E782 Mixed hyperlipidemia: Secondary | ICD-10-CM

## 2020-03-30 DIAGNOSIS — I440 Atrioventricular block, first degree: Secondary | ICD-10-CM

## 2020-03-30 DIAGNOSIS — I700 Atherosclerosis of aorta: Secondary | ICD-10-CM | POA: Diagnosis not present

## 2020-03-30 MED ORDER — PRAVASTATIN SODIUM 40 MG PO TABS: 40.0000 mg | ORAL_TABLET | Freq: Every evening | ORAL | 3 refills | Status: DC

## 2020-03-30 NOTE — Patient Instructions (Signed)
Medication Instructions:  START Pravastatin 40 mg once daily  *If you need a refill on your cardiac medications before your next appointment, please call your pharmacy*   Lab Work: None ordered If you have labs (blood work) drawn today and your tests are completely normal, you will receive your results only by: Marland Kitchen MyChart Message (if you have MyChart) OR . A paper copy in the mail If you have any lab test that is abnormal or we need to change your treatment, we will call you to review the results.   Testing/Procedures: None ordered   Follow-Up: At [facility], you and your health needs are our priority.  As part of our continuing mission to provide you with exceptional heart care, we have created designated Provider Care Teams.  These Care Teams include your primary Cardiologist (physician) and Advanced Practice Providers (APPs -  Physician Assistants and Nurse Practitioners) who all work together to provide you with the care you need, when you need it.  We recommend signing up for the patient portal called "MyChart".  Sign up information is provided on this After Visit Summary.  MyChart is used to connect with patients for Virtual Visits (Telemedicine).  Patients are able to view lab/test results, encounter notes, upcoming appointments, etc.  Non-urgent messages can be sent to your provider as well.   To learn more about what you can do with MyChart, go to [email or web address].    Your next appointment:   12 month(s)  The format for your next appointment:   In Person  Provider:   You may see [name], MD or one of the following Advanced Practice Providers on your designated Care Team:    Almyra Deforest, PA-C  Fabian Sharp, [place] or   Roby Lofts, [place]    Other Instructions  Aortic Valve Stenosis  Aortic valve stenosis is a narrowing of the aortic valve in the heart. The aortic valve opens and closes to regulate blood flow between the left side of the heart (left  ventricle) and the artery that leads away from the heart (aorta). When the aortic valve becomes narrow, it is difficult for the heart to pump blood out to the body, which causes the heart to work harder. The extra work can weaken the heart muscle over time. Aortic valve stenosis can range from mild to severe. If it is not treated, it can become more severe over time and lead to heart failure. What are the causes? This condition may be caused by:  Buildup of calcium around and on the aortic valve. This can occur with aging. This is the most common cause of aortic valve stenosis.  A heart problem that developed in the womb (birth defect).  Rheumatic fever.  Radiation to the chest. What increases the risk? You may be more likely to develop this condition if:  You are older than age 39.  You were born with an abnormal bicuspid valve. What are the signs or symptoms? You may not have any symptoms until your condition becomes severe. It may take 10-20 years for mild or moderate aortic valve stenosis to become severe. Symptoms may include:  Shortness of breath. This may get worse during physical activity.  Feeling unusually weak and tired (fatigue).  Extreme discomfort in the chest, neck, or arm during physical activity (angina).  A heartbeat that is irregular or faster than normal (palpitations).  Dizziness or fainting. This may happen when you get physically tired or after you take certain heart medicines, such as  nitroglycerin. How is this diagnosed? This condition may be diagnosed with:  A physical exam.  Echocardiogram. This is a type of imaging test that uses sound waves (ultrasound) to make images of your heart. There are two kinds of this test that may be used. ? Transthoracic echocardiogram (TTE). For this type, a wand-like tool (transducer) is moved over your chest to create ultrasound images that are recorded by a computer. ? Transesophageal echocardiogram (TEE). For this  type, a flexible tube (probe) is inserted down the part of the body that moves food from your mouth to your stomach (esophagus). The heart and the esophagus are close to each other. Your health care provider will use the probe to take clear, detailed pictures of the heart.  Cardiac catheterization. For this procedure, a small, thin tube (catheter) is passed through a large vein in your neck, groin, or arm. The catheter is used to get information about arteries, structures, blood pressure, and oxygen levels in your heart.  Stress tests. These are tests that evaluate the blood supply to your heart and your heart's response to exercise. You may work with a health care provider who specializes in the heart (cardiologist) for diagnosis and treatment. How is this treated? Treatment depends on how severe your condition is and what your symptoms are. You will need to have your heart checked regularly to make sure that your condition is not getting worse or causing serious problems. Treatment may also include:  Surgery to replace your aortic valve. This is the most common treatment for aortic valve stenosis, and it is the only treatment to cure the condition. Several types of surgeries are available. The surgery may be done: ? Through a large incision over your heart (open-heart surgery). ? Through small incisions, using a flexible tube called a catheter (transcatheter aortic valve replacement, TAVR).  Medicines that help to keep your heart rate regular.  Medicines that thin your blood (anticoagulants) to prevent blood clots.  Antibiotic medicines to help prevent infection. If your condition is mild, you may only need regular follow-up visits for monitoring. Follow these instructions at home: Lifestyle  Limit alcohol intake to no more than 1 drink a day for nonpregnant women and 2 drinks a day for men. One drink equals 12 oz of beer, 5 oz of wine, or 1 oz of hard liquor.  Do not use any products that  contain nicotine or tobacco, such as cigarettes and e-cigarettes. If you need help quitting, ask your health care provider.  Work with your health care provider to manage your blood pressure and cholesterol.  Maintain a healthy weight. Eating and drinking  Eat a heart-healthy diet that includes plenty of fresh fruits and vegetables, whole grains, lean protein, and low-fat or nonfat dairy.  Limit how much caffeine you drink. Caffeine can affect your heart's rate and rhythm.  Avoid foods that are: ? High in salt (sodium), saturated fat, or sugar. ? Canned or highly processed. ? Fried.  Follow instructions from your health care provider about any other eating or drinking restrictions.   Activity  Exercise regularly and return to your normal activities as told by your health care provider. Ask your health care provider what amount and type of physical activity is safe for you. ? If your aortic valve stenosis is mild, you may only need to avoid very intense physical activity, such as heavy weight lifting. ? The more severe your aortic valve stenosis is, the more activities you may need to avoid. If  you are taking blood thinners:  Before you take any medicines that contain aspirin or NSAIDs, talk with your health care provider. These medicines increase your risk for dangerous bleeding.  Take your medicine exactly as told, at the same time every day.  Avoid activities that could cause injury or bruising, and follow instructions about how to prevent falls.  Wear a medical alert bracelet or carry a card that lists what medicines you take. General instructions  Take over-the-counter and prescription medicines only as told by your health care provider.  If you were prescribed an antibiotic, take it as told by your health care provider. Do not stop taking the antibiotic even if you start to feel better.  If you are a woman and you plan to become pregnant, talk with your health care provider  before you become pregnant.  Before you have any type of medical or dental procedure or surgery, tell all health care providers that you have aortic valve stenosis. This may affect the treatment that you receive.  Keep all follow-up visits as told by your health care provider. This is important. Contact a health care provider if:  You have a fever. Get help right away if:  You develop any of the following symptoms: ? Chest pain. ? Chest tightness. ? Shortness of breath. ? Trouble breathing.  You feel light-headed.  You feel like you might faint.  Your heartbeat is irregular or faster than normal. These symptoms may represent a serious problem that is an emergency. Do not wait to see if the symptoms will go away. Get medical help right away. Call your local emergency services (911 in the [place] Do not drive yourself to the hospital. Summary  Aortic valve stenosis is a narrowing of the aortic valve in the heart. The aortic valve opens and closes to regulate blood flow between the left side of the heart (left ventricle) and the artery that leads away from the heart (aorta).  Aortic valve stenosis can range from mild to severe. If it is not treated, it can become more severe over time and lead to heart failure.  Treatment depends on how severe your condition is and what your symptoms are. You will need to have your heart checked regularly to make sure that your condition is not getting worse or causing serious problems.  Exercise regularly and return to your normal activities as told by your health care provider. Ask your health care provider what amount and type of physical activity is safe for you. This information is not intended to replace advice given to you by your health care provider. Make sure you discuss any questions you have with your health care provider. Document Revised: 06/15/2019 Document Reviewed: 06/15/2019 Elsevier Patient Education  2021 [employer].

## 2020-03-30 NOTE — Progress Notes (Signed)
Cardiology Office Consultation Note:    Date:  04/01/2020   ID:  Mandy Delacruz, Mandy Delacruz [birth year], MRN [id number]  PCP:  Mandy Delacruz, [place]  Cardiologist:  No primary care provider on file. NEW Advanced Practice Provider:  No care team member to display Electrophysiologist:  None       Referring MD: Mandy Delacruz, *   No chief complaint on file. Mandy Delacruz is a 75 y.o. female who is being seen today for the evaluation of aortic stenosis at the request of Mandy Delacruz, *.   History of Present Illness:    Mandy Delacruz is a 75 y.o. female with a long history of smoking and COPD, hypertension, hiatal hernia and GERD, discovered to have aortic stenosis after undergoing CT of the chest.  Her mother Mandy Delacruz was also my patient, passed away recently.  She was being evaluated by Dr. Roxan Delacruz for lung nodules that improved after antibiotics, suggesting they were inflammatory rather than malignant.  During the evaluation, an echocardiogram was performed which showed that she has moderate aortic stenosis (mean gradient 29 mmHg, aortic valve area 1.3 cm, dimensionless index 0.42), normal left ventricular systolic function with moderate left ventricular hypertrophy.  A previous echo from 2019 also described aortic stenosis, but appeared to underestimate the aortic valve area.  The mean aortic gradient was not calculated.  The peak velocity has increased from 2.57 m/s in 2019 to 2.71 m/s on the echo from December 2021  She is not exactly asymptomatic.  For a couple of years she has had symptoms of chest tightness when walking in cold weather.  The symptoms resolved promptly when she stops and rests.  She denies change in her chronic pattern of NYHA functional class II dyspnea, which she attributes to COPD.  She has occasional wheezing.  She does not have lower extremity edema, orthopnea or PND.  She has not had problems of palpitations,  dizziness, syncope, focal neurological events or intermittent claudication.  She does not take a statin.  Her most recent lipid profile from November 2021 shows LDL 127 and mildly elevated triglycerides.  Also of note her hemoglobin A1c was 6.7%, although she does not officially bear diagnosis of diabetes mellitus.  She has a 45-pack-year history of smoking and quit in the last few months.  Past Medical History:  Diagnosis Date  . Aortic stenosis   . Asthma   . Bronchitis    on bactrin since 12-27-14  . Colon polyps 2001  . Diverticulosis of colon (without mention of hemorrhage) 2001  . Dizziness    inner ear problem at times, saw dr  Mandy Delacruz for  . GERD (gastroesophageal reflux disease)   . Heart murmur   . Hiatal hernia 2001  . Hyperlipidemia   . Hypertension   . IH (inguinal hernia)    left  . Lung nodule   . Other esophagitis 2001  . Tobacco abuse     Past Surgical History:  Procedure Laterality Date  . breast biospy     left, benign  . CHOLECYSTECTOMY    . COLONOSCOPY    . ENDOVENOUS ABLATION SAPHENOUS VEIN W/ LASER Left 04/29/2016   endovenous laser ablation L SSV and stab phlebectomy left leg by Tinnie Gens MD   . [place]  oct 2015   left  . INGUINAL HERNIA REPAIR Left 01/10/2015   Procedure: OPEN REPAIR OF RECURRENT LEFT INGUINAL HERNIA ;  Surgeon:  Greer Pickerel, MD;  Location: [facility];  Service: General;  Laterality: Left;  . INSERTION OF MESH Left 01/10/2015   Procedure: INSERTION OF MESH;  Surgeon: Greer Pickerel, MD;  Location: [facility];  Service: General;  Laterality: Left;  . INSERTION OF MESH N/A 08/18/2017   Procedure: INSERTION OF MESH;  Surgeon: Greer Pickerel, MD;  Location: [place];  Service: General;  Laterality: N/A;  . LAPAROSCOPY N/A 01/10/2015   Procedure: LAPAROSCOPY DIAGNOSTIC;  Surgeon: Greer Pickerel, MD;  Location: [facility];  Service: General;  Laterality: N/A;  . ORIF HIP FRACTURE  2006   3 pins, right  . UMBILICAL HERNIA REPAIR N/A  08/18/2017   Procedure: LAPAROSCOPIC-ASSISTED UMBILICAL HERNIA  REPAIR ERAS PATHWAY;  Surgeon: Greer Pickerel, MD;  Location: [facility];  Service: General;  Laterality: N/A;    Current Medications: Current Meds  Medication Sig  . Albuterol Sulfate (PROAIR HFA IN) Inhale 2 puffs into the lungs 4 (four) times daily as needed (wheezing).   . diphenhydrAMINE (BENADRYL) 25 MG tablet Take 25 mg by mouth at bedtime.  Marland Kitchen ibuprofen (ADVIL,MOTRIN) 200 MG tablet Take 400 mg by mouth every morning.   Marland Kitchen losartan-hydrochlorothiazide (HYZAAR) 50-12.5 MG tablet Take 1 tablet by mouth daily.  Marland Kitchen omeprazole (PRILOSEC OTC) 20 MG tablet Take 20 mg by mouth at bedtime.   . pravastatin (PRAVACHOL) 40 MG tablet Take 1 tablet (40 mg total) by mouth every evening.  . SYMBICORT 80-4.5 MCG/ACT inhaler Take 2 puffs by mouth daily.     Allergies:   Doxycycline hyclate and Codeine   Social History   Socioeconomic History  . Marital status: Married    Spouse name: Not on file  . Number of children: 2  . Years of education: Not on file  . Highest education level: Not on file  Occupational History  . Occupation: part-time office asst.  Tobacco Use  . Smoking status: Former Smoker    Years: 45.00    Types: Cigarettes  . Smokeless tobacco: Never Used  . Tobacco comment: maybe 5-10 cigarettes a day  starts and stops smoking every few days  Vaping Use  . Vaping Use: Never used  Substance and Sexual Activity  . Alcohol use: Yes    Alcohol/week: 0.0 - 1.0 standard drinks    Comment: social  . Drug use: No  . Sexual activity: Not on file  Other Topics Concern  . Not on file  Social History Narrative  . Not on file   Social Determinants of Health   Financial Resource Strain: Not on file  Food Insecurity: Not on file  Transportation Needs: Not on file  Physical Activity: Not on file  Stress: Not on file  Social Connections: Not on file     Family History: The patient's family history includes COPD in her  mother; Celiac disease in an other family member; Hypertension in her father. There is no history of Colon cancer, Esophageal cancer, Rectal cancer, or Stomach cancer.  ROS:   Please see the history of present illness.     All other systems reviewed and are negative.  EKGs/Labs/Other Studies Reviewed:    The following studies were reviewed today:  Notes from Dr. Merilynn Finland, several CT exams of the chest  Echocardiogram 12/26/2019   1. Left ventricular ejection fraction, by estimation, is 60 to 65%. The  left ventricle has normal function. The left ventricle has no regional  wall motion abnormalities. There is moderate left ventricular hypertrophy.  Left ventricular diastolic  parameters are consistent with Grade I diastolic dysfunction (impaired  relaxation).  2. Right ventricular systolic function is normal. The right ventricular  size is normal.  3. The mitral valve is normal in structure. No evidence of mitral valve  regurgitation. No evidence of mitral stenosis. Moderate mitral annular  calcification.  4. The aortic valve is calcified. There is moderate calcification of the  aortic valve. There is moderate thickening of the aortic valve. Aortic  valve regurgitation is not visualized. Moderate aortic valve stenosis.  Aortic valve area, by VTI measures  1.32 cm. Aortic valve mean gradient measures 29.3 mmHg. Aortic valve Vmax  measures 3.71 m/s.  5. The inferior vena cava is normal in size with greater than 50%  respiratory variability, suggesting right atrial pressure of 3 mmHg.    AV Area (VTI):   1.32 cm  AV Vmax:      371.33 cm/s  AV Peak Grad:   55.2 mmHg  AV Mean Grad:   29.3 mmHg  LVOT/AV VTI ratio: 0.42   EKG:  EKG is ordered today.  The ekg ordered today demonstrates it shows sinus rhythm with first-degree AV block, Q waves in the inferior leads are relatively sharp, no repolarization abnormalities.  Recent Labs: No results found for  requested labs within last 8760 hours.  11/28/2019 Hemoglobin A1c 6.7%, was 146, creatinine 0.96, TSH 1.16 Recent Lipid Panel No results found for: CHOL, TRIG, HDL, CHOLHDL, VLDL, LDLCALC, LDLDIRECT 11/28/2019 Cholesterol 198, HDL 42, LDL 127, triglycerides 170   Risk Assessment/Calculations:       Physical Exam:    VS:  BP (!) 146/67   Pulse 80   Ht 5\' 5"  (1.651 m)   Wt 147 lb 12.8 oz (67 kg)   SpO2 98%   BMI 24.60 kg/m     Wt Readings from Last 3 Encounters:  03/30/20 147 lb 12.8 oz (67 kg)  01/31/20 147 lb 3.2 oz (66.8 kg)  12/31/17 154 lb 9.6 oz (70.1 kg)     GEN:  Well nourished, well developed in no acute distress HEENT: Normal NECK: No JVD; No carotid bruits LYMPHATICS: No lymphadenopathy CARDIAC: RRR, distinct second heart sound, 3/6 mid peaking aortic ejection murmur, no diastolic murmurs, rubs, gallops RESPIRATORY:  Clear to auscultation without rales, wheezing or rhonchi  ABDOMEN: Soft, non-tender, non-distended MUSCULOSKELETAL:  No edema; No deformity  SKIN: Warm and dry NEUROLOGIC:  Alert and oriented x 3 PSYCHIATRIC:  Normal affect   ASSESSMENT:    1. Nonrheumatic aortic valve stenosis   2. Exertional angina (HCC)   3. Atherosclerosis of aorta ([place])   4. Essential hypertension   5. Mixed hyperlipidemia   6. First degree AV block   7. Chronic bronchitis, unspecified chronic bronchitis type ([place])   8. Type 2 diabetes mellitus without complication, without long-term current use of insulin (HCC)    PLAN:    In order of problems listed above:  1. AS: Mrs. Spilde has moderate to severe calcific aortic stenosis, and it is quite possible that she will progress to severe aortic stenosis in the next 2 or 3 years.  We discussed the symptoms of aortic stenosis (exertional angina, exertional dyspnea and exertional syncope).  Some of the symptoms already present but can be attributed to COPD and probable underlying CAD respectively.  Recommended that she call if  there is any change in the pattern of her complaints.  We will check her echocardiogram at least once yearly.  Reviewed options for treatment with SAVR versus  TAVR, the latter probably being the better choice in view of her age and pulmonary disease. 2. Exertional angina: Fairly typical description of exertional angina with a lower threshold in cold weather.  Currently asymptomatic.  Would like to avoid beta-blockers in view of her lung disorder.  We can add nitrates, but these may also be disadvantageous with aortic stenosis.  She will eventually require coronary angiography as work-up for expected TAVR.  In the meantime, pay attention to the pattern of symptoms and call if she develops accelerated angina.  Should seek urgent medical attention for symptoms of last more than 30 minutes, not relieved by rest. 3. Aortic atherosclerosis: Documented on CT of the chest.  Also suspect that she has CAD.  Needs lipid-lowering therapy.  Check a lipid profile.  Target LDL less than 70. 4. HTN: Fair control, although not optimal today.  No changes made to antihypertensive medications.  Development of dizziness or hypotension while taking diuretics may be an indication that she requires aortic valve repair. 5. HLP: Lipid profile is fairly typical for insulin resistance/metabolic syndrome with borderline low HDL, mildly elevated triglycerides and mildly elevated LDL.  Start pravastatin 40 mg in the evening.  Recheck in a few months. 6. DM: I think she fully meets criteria for this diagnosis with a hemoglobin A1c of 6.7% and a fasting glucose of 146.  At this point does not necessarily appear to need medications, but would recommend weight loss, restricted carbohydrate diet (particularly avoiding sweets and starches with high glycemic index), regular mild-moderate exercise. 7. First-degree AV block: No signs or symptoms of high-grade AV conduction abnormalities, may be at high risk for pacemaker with TAVR in the  future. 8. COPD: Has quit smoking.  Functional class II exertional dyspnea.  Symptoms fairly well controlled with inhalers.        Medication Adjustments/Labs and Tests Ordered: Current medicines are reviewed at length with the patient today.  Concerns regarding medicines are outlined above.  Orders Placed This Encounter  Procedures  . EKG 12-Lead   Meds ordered this encounter  Medications  . pravastatin (PRAVACHOL) 40 MG tablet    Sig: Take 1 tablet (40 mg total) by mouth every evening.    Dispense:  90 tablet    Refill:  3    Patient Instructions   Medication Instructions:  START Pravastatin 40 mg once daily  *If you need a refill on your cardiac medications before your next appointment, please call your pharmacy*   Lab Work: None ordered If you have labs (blood work) drawn today and your tests are completely normal, you will receive your results only by: Marland Kitchen MyChart Message (if you have MyChart) OR . A paper copy in the mail If you have any lab test that is abnormal or we need to change your treatment, we will call you to review the results.   Testing/Procedures: None ordered   Follow-Up: At [facility], you and your health needs are our priority.  As part of our continuing mission to provide you with exceptional heart care, we have created designated Provider Care Teams.  These Care Teams include your primary Cardiologist (physician) and Advanced Practice Providers (APPs -  Physician Assistants and Nurse Practitioners) who all work together to provide you with the care you need, when you need it.  We recommend signing up for the patient portal called "MyChart".  Sign up information is provided on this After Visit Summary.  MyChart is used to connect with patients for Virtual Visits (  Telemedicine).  Patients are able to view lab/test results, encounter notes, upcoming appointments, etc.  Non-urgent messages can be sent to your provider as well.   To learn more about  what you can do with MyChart, go to [email or web address].    Your next appointment:   12 month(s)  The format for your next appointment:   In Person  Provider:   You may see [name], MD or one of the following Advanced Practice Providers on your designated Care Team:    Almyra Deforest, PA-C  Fabian Sharp, [place] or   Roby Lofts, [place]    Other Instructions  Aortic Valve Stenosis  Aortic valve stenosis is a narrowing of the aortic valve in the heart. The aortic valve opens and closes to regulate blood flow between the left side of the heart (left ventricle) and the artery that leads away from the heart (aorta). When the aortic valve becomes narrow, it is difficult for the heart to pump blood out to the body, which causes the heart to work harder. The extra work can weaken the heart muscle over time. Aortic valve stenosis can range from mild to severe. If it is not treated, it can become more severe over time and lead to heart failure. What are the causes? This condition may be caused by:  Buildup of calcium around and on the aortic valve. This can occur with aging. This is the most common cause of aortic valve stenosis.  A heart problem that developed in the womb (birth defect).  Rheumatic fever.  Radiation to the chest. What increases the risk? You may be more likely to develop this condition if:  You are older than age 47.  You were born with an abnormal bicuspid valve. What are the signs or symptoms? You may not have any symptoms until your condition becomes severe. It may take 10-20 years for mild or moderate aortic valve stenosis to become severe. Symptoms may include:  Shortness of breath. This may get worse during physical activity.  Feeling unusually weak and tired (fatigue).  Extreme discomfort in the chest, neck, or arm during physical activity (angina).  A heartbeat that is irregular or faster than normal (palpitations).  Dizziness or fainting. This  may happen when you get physically tired or after you take certain heart medicines, such as nitroglycerin. How is this diagnosed? This condition may be diagnosed with:  A physical exam.  Echocardiogram. This is a type of imaging test that uses sound waves (ultrasound) to make images of your heart. There are two kinds of this test that may be used. ? Transthoracic echocardiogram (TTE). For this type, a wand-like tool (transducer) is moved over your chest to create ultrasound images that are recorded by a computer. ? Transesophageal echocardiogram (TEE). For this type, a flexible tube (probe) is inserted down the part of the body that moves food from your mouth to your stomach (esophagus). The heart and the esophagus are close to each other. Your health care provider will use the probe to take clear, detailed pictures of the heart.  Cardiac catheterization. For this procedure, a small, thin tube (catheter) is passed through a large vein in your neck, groin, or arm. The catheter is used to get information about arteries, structures, blood pressure, and oxygen levels in your heart.  Stress tests. These are tests that evaluate the blood supply to your heart and your heart's response to exercise. You may work with a health care provider who specializes in the heart (cardiologist)  for diagnosis and treatment. How is this treated? Treatment depends on how severe your condition is and what your symptoms are. You will need to have your heart checked regularly to make sure that your condition is not getting worse or causing serious problems. Treatment may also include:  Surgery to replace your aortic valve. This is the most common treatment for aortic valve stenosis, and it is the only treatment to cure the condition. Several types of surgeries are available. The surgery may be done: ? Through a large incision over your heart (open-heart surgery). ? Through small incisions, using a flexible tube called a  catheter (transcatheter aortic valve replacement, TAVR).  Medicines that help to keep your heart rate regular.  Medicines that thin your blood (anticoagulants) to prevent blood clots.  Antibiotic medicines to help prevent infection. If your condition is mild, you may only need regular follow-up visits for monitoring. Follow these instructions at home: Lifestyle  Limit alcohol intake to no more than 1 drink a day for nonpregnant women and 2 drinks a day for men. One drink equals 12 oz of beer, 5 oz of wine, or 1 oz of hard liquor.  Do not use any products that contain nicotine or tobacco, such as cigarettes and e-cigarettes. If you need help quitting, ask your health care provider.  Work with your health care provider to manage your blood pressure and cholesterol.  Maintain a healthy weight. Eating and drinking  Eat a heart-healthy diet that includes plenty of fresh fruits and vegetables, whole grains, lean protein, and low-fat or nonfat dairy.  Limit how much caffeine you drink. Caffeine can affect your heart's rate and rhythm.  Avoid foods that are: ? High in salt (sodium), saturated fat, or sugar. ? Canned or highly processed. ? Fried.  Follow instructions from your health care provider about any other eating or drinking restrictions.   Activity  Exercise regularly and return to your normal activities as told by your health care provider. Ask your health care provider what amount and type of physical activity is safe for you. ? If your aortic valve stenosis is mild, you may only need to avoid very intense physical activity, such as heavy weight lifting. ? The more severe your aortic valve stenosis is, the more activities you may need to avoid. If you are taking blood thinners:  Before you take any medicines that contain aspirin or NSAIDs, talk with your health care provider. These medicines increase your risk for dangerous bleeding.  Take your medicine exactly as told, at  the same time every day.  Avoid activities that could cause injury or bruising, and follow instructions about how to prevent falls.  Wear a medical alert bracelet or carry a card that lists what medicines you take. General instructions  Take over-the-counter and prescription medicines only as told by your health care provider.  If you were prescribed an antibiotic, take it as told by your health care provider. Do not stop taking the antibiotic even if you start to feel better.  If you are a woman and you plan to become pregnant, talk with your health care provider before you become pregnant.  Before you have any type of medical or dental procedure or surgery, tell all health care providers that you have aortic valve stenosis. This may affect the treatment that you receive.  Keep all follow-up visits as told by your health care provider. This is important. Contact a health care provider if:  You have a fever. Get  help right away if:  You develop any of the following symptoms: ? Chest pain. ? Chest tightness. ? Shortness of breath. ? Trouble breathing.  You feel light-headed.  You feel like you might faint.  Your heartbeat is irregular or faster than normal. These symptoms may represent a serious problem that is an emergency. Do not wait to see if the symptoms will go away. Get medical help right away. Call your local emergency services (911 in the [place] Do not drive yourself to the hospital. Summary  Aortic valve stenosis is a narrowing of the aortic valve in the heart. The aortic valve opens and closes to regulate blood flow between the left side of the heart (left ventricle) and the artery that leads away from the heart (aorta).  Aortic valve stenosis can range from mild to severe. If it is not treated, it can become more severe over time and lead to heart failure.  Treatment depends on how severe your condition is and what your symptoms are. You will need to have your heart  checked regularly to make sure that your condition is not getting worse or causing serious problems.  Exercise regularly and return to your normal activities as told by your health care provider. Ask your health care provider what amount and type of physical activity is safe for you. This information is not intended to replace advice given to you by your health care provider. Make sure you discuss any questions you have with your health care provider. Document Revised: 06/15/2019 Document Reviewed: 06/15/2019 Elsevier Patient Education  2021 [place], [name], MD  04/01/2020 9:24 PM    [place]

## 2020-04-01 DIAGNOSIS — J42 Unspecified chronic bronchitis: Secondary | ICD-10-CM | POA: Insufficient documentation

## 2020-04-01 DIAGNOSIS — I10 Essential (primary) hypertension: Secondary | ICD-10-CM | POA: Insufficient documentation

## 2020-04-01 DIAGNOSIS — I2089 Other forms of angina pectoris: Secondary | ICD-10-CM | POA: Insufficient documentation

## 2020-04-01 DIAGNOSIS — I700 Atherosclerosis of aorta: Secondary | ICD-10-CM | POA: Insufficient documentation

## 2020-04-01 DIAGNOSIS — E782 Mixed hyperlipidemia: Secondary | ICD-10-CM | POA: Insufficient documentation

## 2020-04-01 DIAGNOSIS — I208 Other forms of angina pectoris: Secondary | ICD-10-CM | POA: Insufficient documentation

## 2020-04-01 DIAGNOSIS — E119 Type 2 diabetes mellitus without complications: Secondary | ICD-10-CM | POA: Insufficient documentation

## 2020-04-01 DIAGNOSIS — I350 Nonrheumatic aortic (valve) stenosis: Secondary | ICD-10-CM | POA: Insufficient documentation

## 2020-04-01 DIAGNOSIS — I440 Atrioventricular block, first degree: Secondary | ICD-10-CM | POA: Insufficient documentation

## 2020-04-04 DIAGNOSIS — H5202 Hypermetropia, left eye: Secondary | ICD-10-CM | POA: Diagnosis not present

## 2020-04-04 DIAGNOSIS — Z961 Presence of intraocular lens: Secondary | ICD-10-CM | POA: Diagnosis not present

## 2020-04-04 DIAGNOSIS — E119 Type 2 diabetes mellitus without complications: Secondary | ICD-10-CM | POA: Diagnosis not present

## 2020-04-04 DIAGNOSIS — H2512 Age-related nuclear cataract, left eye: Secondary | ICD-10-CM | POA: Diagnosis not present

## 2020-06-25 ENCOUNTER — Other Ambulatory Visit: Payer: Self-pay | Admitting: Thoracic Surgery (Cardiothoracic Vascular Surgery)

## 2020-06-25 DIAGNOSIS — I129 Hypertensive chronic kidney disease with stage 1 through stage 4 chronic kidney disease, or unspecified chronic kidney disease: Secondary | ICD-10-CM | POA: Diagnosis not present

## 2020-06-25 DIAGNOSIS — E1169 Type 2 diabetes mellitus with other specified complication: Secondary | ICD-10-CM | POA: Diagnosis not present

## 2020-06-25 DIAGNOSIS — E7800 Pure hypercholesterolemia, unspecified: Secondary | ICD-10-CM | POA: Diagnosis not present

## 2020-06-25 DIAGNOSIS — Z0000 Encounter for general adult medical examination without abnormal findings: Secondary | ICD-10-CM | POA: Diagnosis not present

## 2020-06-25 DIAGNOSIS — R911 Solitary pulmonary nodule: Secondary | ICD-10-CM

## 2020-06-25 DIAGNOSIS — J449 Chronic obstructive pulmonary disease, unspecified: Secondary | ICD-10-CM | POA: Diagnosis not present

## 2020-06-25 DIAGNOSIS — M810 Age-related osteoporosis without current pathological fracture: Secondary | ICD-10-CM | POA: Diagnosis not present

## 2020-06-26 DIAGNOSIS — H25812 Combined forms of age-related cataract, left eye: Secondary | ICD-10-CM | POA: Diagnosis not present

## 2020-06-26 DIAGNOSIS — H2512 Age-related nuclear cataract, left eye: Secondary | ICD-10-CM | POA: Diagnosis not present

## 2020-06-26 DIAGNOSIS — H25012 Cortical age-related cataract, left eye: Secondary | ICD-10-CM | POA: Diagnosis not present

## 2020-07-24 ENCOUNTER — Ambulatory Visit
Admission: RE | Admit: 2020-07-24 | Discharge: 2020-07-24 | Disposition: A | Discharge status: Home / Self Care | Payer: BC Managed Care – PPO | Source: Ambulatory Visit | Attending: Thoracic Surgery (Cardiothoracic Vascular Surgery) | Admitting: Thoracic Surgery (Cardiothoracic Vascular Surgery)

## 2020-07-24 ENCOUNTER — Ambulatory Visit: Payer: BC Managed Care – PPO | Admitting: Thoracic Surgery (Cardiothoracic Vascular Surgery)

## 2020-07-24 DIAGNOSIS — R911 Solitary pulmonary nodule: Secondary | ICD-10-CM

## 2020-07-24 DIAGNOSIS — J439 Emphysema, unspecified: Secondary | ICD-10-CM | POA: Diagnosis not present

## 2020-07-24 DIAGNOSIS — I700 Atherosclerosis of aorta: Secondary | ICD-10-CM | POA: Diagnosis not present

## 2020-07-24 DIAGNOSIS — R918 Other nonspecific abnormal finding of lung field: Secondary | ICD-10-CM | POA: Diagnosis not present

## 2020-08-07 ENCOUNTER — Ambulatory Visit (INDEPENDENT_AMBULATORY_CARE_PROVIDER_SITE_OTHER): Payer: BC Managed Care – PPO | Admitting: Thoracic Surgery (Cardiothoracic Vascular Surgery)

## 2020-08-07 ENCOUNTER — Other Ambulatory Visit: Payer: Self-pay

## 2020-08-07 VITALS — BP 170/64 | HR 87 | Resp 20 | Ht 65.0 in | Wt 145.0 lb

## 2020-08-07 DIAGNOSIS — R911 Solitary pulmonary nodule: Secondary | ICD-10-CM

## 2020-08-07 NOTE — Progress Notes (Signed)
[address]       [place] [address]             [phone]     HPI: Mandy Delacruz returns for follow-up of her lung nodules.  Mandy Delacruz is a 75 year old woman with a history of tobacco abuse, COPD, lung nodules, chronic bronchitis, asthma, hypertension, hiatal hernia, reflux, and aortic stenosis.  In 2019 she had multiple bouts of bronchitis which have been treated with antibiotics.  A CT showed a nodular density in the lingula.  There were also multiple nodules in the right upper lobe.  On PET/CT there was minimal uptake.  A follow-up scan 3 months later showed improvement.  I last saw her in January.  The prior nodules were stable.  There was a new 6 mm nodule in the left apex.  She also had a significant aortic stenosis murmur and an echo showed moderate aortic stenosis with a valve area of 1.3 cm.  I referred her to Dr. Sallyanne Kuster for that.  He saw her in March.  She quit smoking for a couple of months but now has restarted.  She says she is smoking between 5 and 10 cigarettes a day.  Chronic cough but no significant changes in respiratory status.  Past Medical History:  Diagnosis Date   Aortic stenosis    Asthma    Bronchitis    on bactrin since 12-27-14   Colon polyps 2001   Diverticulosis of colon (without mention of hemorrhage) 2001   Dizziness    inner ear problem at times, saw dr  [name] for   GERD (gastroesophageal reflux disease)    Heart murmur    Hiatal hernia 2001   Hyperlipidemia    Hypertension    IH (inguinal hernia)    left   Lung nodule    Other esophagitis 2001   Tobacco abuse     Current Outpatient Medications  Medication Sig Dispense Refill   Albuterol Sulfate (PROAIR HFA IN) Inhale 2 puffs into the lungs 4 (four) times daily as needed (wheezing).      diphenhydrAMINE (BENADRYL) 25 MG tablet Take 25 mg by mouth at bedtime.     ibuprofen (ADVIL,MOTRIN) 200 MG tablet Take 400 mg by mouth every morning.       losartan-hydrochlorothiazide (HYZAAR) 50-12.5 MG tablet Take 1 tablet by mouth daily.  0   omeprazole (PRILOSEC OTC) 20 MG tablet Take 20 mg by mouth at bedtime.      SYMBICORT 80-4.5 MCG/ACT inhaler Take 2 puffs by mouth daily.  11   pravastatin (PRAVACHOL) 40 MG tablet Take 1 tablet (40 mg total) by mouth every evening. 90 tablet 3   No current facility-administered medications for this visit.    Physical Exam BP (!) 170/64 (BP Location: Right Arm, Patient Position: Sitting)   Pulse 87   Resp 20   Ht '5\' 5"'$  (1.651 m)   Wt 145 lb (65.8 kg)   SpO2 92% Comment: RA  BMI 24.37 kg/m  75 year old woman in no acute distress Alert and oriented x3 with no focal deficits Lungs faint wheezes in bases otherwise clear Cardiac regular rate and rhythm with a 2/6 crescendo decrescendo murmur No peripheral edema   Diagnostic Tests: CT CHEST WITHOUT CONTRAST   TECHNIQUE: Multidetector CT imaging of the chest was performed following the standard protocol without IV contrast.   COMPARISON:  12/26/2019   FINDINGS: Cardiovascular: Advanced aortic and branch vessel atherosclerosis. Tortuous thoracic aorta. Normal  heart size, without pericardial effusion. Lad and right coronary artery calcification. Advanced aortic valve calcification.   Mediastinum/Nodes: No mediastinal or definite hilar adenopathy, given limitations of unenhanced CT.   Lungs/Pleura: No pleural fluid.  Mild centrilobular emphysema.   The nodule of interest in the left apex is similar at 5 mm on 18/8 versus 5-6 mm on the prior.   Scattered areas of clustered nodularity including within the posterior right upper lobe on 54/8 and left upper lobe on 65/8 are felt to be similar and likely post infectious/inflammatory.   Enlarged right apical tandem nodules including at 6 mm on 22/8, somewhat ill-defined. These measure maximally 2-3 mm on the prior.   A right middle lobe 3 mm nodule is new on 65/8.   Upper Abdomen:  Cholecystectomy. Normal imaged portions of the liver, spleen, pancreas, kidneys, right adrenal gland. Similar left adrenal thickening. Proximal gastric underdistention.   Musculoskeletal: Osteopenia. Remote upper anterior left rib fractures. Accentuation of expected thoracic kyphosis.   IMPRESSION: 1. The left apical pulmonary nodule is similar over approximately 8 months, favored to be benign. 2. Other pulmonary nodules which are new/enlarged as detailed above. Favored to be infectious/inflammatory, especially given other areas of clustered nodularity. Consider surveillance with chest CT at 6-12 months. 3. Aortic atherosclerosis (ICD10-I70.0), coronary artery atherosclerosis and emphysema (ICD10-J43.9). 4. Aortic valvular calcifications. Consider echocardiography to evaluate for valvular dysfunction.     Electronically Signed   By: Abigail Miyamoto M.D.   On: 07/25/2020 14:55 I personally reviewed the CT images.  No change in the left apical or lingular opacities.  Multiple ill-defined nodules bilaterally.  Impression: Mandy Delacruz is a 75 year old woman with a history of tobacco abuse, COPD, lung nodules, chronic bronchitis, asthma, hypertension, hiatal hernia, reflux, and aortic stenosis.  Lung nodules-CT shows stability of her known lung nodules.  There are multiple new small scattered clusters of nodules consistent with infectious or inflammatory etiology.  Tobacco abuse-quit for a few months but unfortunately has started back.  Emphasized the importance of tobacco cessation for her health overall and lungs in particular.  Aortic stenosis-moderate with an aortic valve area of 1.3 cm.  Followed by Dr. Sallyanne Kuster.  Hypertension-systolic pressure elevated.  May be a component of whitecoat hypertension as she gets very anxious about her scan results prior to seeing me.  Plan: Quit smoking Follow-up with Dr. Sallyanne Kuster Return in 6 months with CT chest to follow-up lung  nodules  Melrose Nakayama, MD [facility] [phone]

## 2020-12-18 ENCOUNTER — Other Ambulatory Visit: Payer: Self-pay | Admitting: Thoracic Surgery (Cardiothoracic Vascular Surgery)

## 2020-12-18 DIAGNOSIS — R911 Solitary pulmonary nodule: Secondary | ICD-10-CM

## 2021-02-19 ENCOUNTER — Encounter: Payer: Self-pay | Admitting: Thoracic Surgery (Cardiothoracic Vascular Surgery)

## 2021-02-19 ENCOUNTER — Ambulatory Visit
Admission: RE | Admit: 2021-02-19 | Discharge: 2021-02-19 | Disposition: A | Discharge status: Home / Self Care | Payer: BC Managed Care – PPO | Source: Ambulatory Visit | Attending: Thoracic Surgery (Cardiothoracic Vascular Surgery) | Admitting: Thoracic Surgery (Cardiothoracic Vascular Surgery)

## 2021-02-19 ENCOUNTER — Ambulatory Visit (INDEPENDENT_AMBULATORY_CARE_PROVIDER_SITE_OTHER): Payer: BC Managed Care – PPO | Admitting: Thoracic Surgery (Cardiothoracic Vascular Surgery)

## 2021-02-19 ENCOUNTER — Other Ambulatory Visit: Payer: Self-pay

## 2021-02-19 VITALS — BP 169/80 | HR 77 | Resp 20 | Ht 65.0 in | Wt 144.0 lb

## 2021-02-19 DIAGNOSIS — R911 Solitary pulmonary nodule: Secondary | ICD-10-CM

## 2021-02-19 DIAGNOSIS — R0602 Shortness of breath: Secondary | ICD-10-CM | POA: Diagnosis not present

## 2021-02-19 DIAGNOSIS — R918 Other nonspecific abnormal finding of lung field: Secondary | ICD-10-CM | POA: Diagnosis not present

## 2021-02-19 NOTE — Progress Notes (Signed)
[address]       [place] [address]             [phone]     HPI: Mandy Delacruz returns for a scheduled follow-up visit for multiple lung nodules.   Mandy Delacruz is a 76 year old woman with a history of tobacco abuse, COPD, lung nodules, chronic bronchitis, asthma, aortic stenosis, hypertension, hiatal hernia, and reflux.  He was first noted to have lung nodules back in 2019.  She had multiple bouts of bronchitis.  CT showed a nodular density in the lingula.  She has been followed since then.  I last saw her in July 2022.  At that time there were multiple new small scattered clusters of nodules.  She continues to have some chest tightness with exertion relieved by rest.  She does have known aortic stenosis.  Her symptoms are stable.  She has a follow-up appointment with Dr. Sallyanne Kuster in March.  She also continues to have intermittent episodes of bronchitis.  She does continue to smoke.   Past Medical History:  Diagnosis Date   Aortic stenosis    Asthma    Bronchitis    on bactrin since 12-27-14   Colon polyps 2001   Diverticulosis of colon (without mention of hemorrhage) 2001   Dizziness    inner ear problem at times, saw dr  [name] for   GERD (gastroesophageal reflux disease)    Heart murmur    Hiatal hernia 2001   Hyperlipidemia    Hypertension    IH (inguinal hernia)    left   Lung nodule    Other esophagitis 2001   Tobacco abuse     Current Outpatient Medications  Medication Sig Dispense Refill   Albuterol Sulfate (PROAIR HFA IN) Inhale 2 puffs into the lungs 4 (four) times daily as needed (wheezing).      diphenhydrAMINE (BENADRYL) 25 MG tablet Take 25 mg by mouth at bedtime.     ibuprofen (ADVIL,MOTRIN) 200 MG tablet Take 400 mg by mouth every morning.      losartan-hydrochlorothiazide (HYZAAR) 50-12.5 MG tablet Take 1 tablet by mouth daily.  0   omeprazole (PRILOSEC OTC) 20 MG tablet Take 20 mg by mouth at bedtime.      SYMBICORT  80-4.5 MCG/ACT inhaler Take 2 puffs by mouth daily.  11   pravastatin (PRAVACHOL) 40 MG tablet Take 1 tablet (40 mg total) by mouth every evening. 90 tablet 3   No current facility-administered medications for this visit.    Physical Exam BP (!) 169/80 (BP Location: Left Arm, Patient Position: Sitting, Cuff Size: Normal)    Pulse 77    Resp 20    Ht 5\' 5"  (1.651 m)    Wt 144 lb (65.3 kg)    SpO2 95% Comment: RA   BMI 23.54 kg/m  76 year old woman in no acute distress Alert and oriented x3 with no focal deficits Lungs clear bilaterally with no rales or wheezing Cardiac regular rate and rhythm with a 3/6 crescendo-decrescendo murmur No peripheral edema  Diagnostic Tests: CT CHEST WITHOUT CONTRAST   TECHNIQUE: Multidetector CT imaging of the chest was performed following the standard protocol without IV contrast.   RADIATION DOSE REDUCTION: This exam was performed according to the departmental dose-optimization program which includes automated exposure control, adjustment of the mA and/or kV according to patient size and/or use of iterative reconstruction technique.   COMPARISON:  July 24, 2020.   FINDINGS: Cardiovascular: Atherosclerosis of  thoracic aorta is noted without aneurysm formation. Normal cardiac size. No pericardial effusion.   Mediastinum/Nodes: No enlarged mediastinal or axillary lymph nodes. Thyroid gland, trachea, and esophagus demonstrate no significant findings.   Lungs/Pleura: No pneumothorax or pleural effusion is noted. Mild emphysematous disease is noted. Stable bilateral pulmonary nodules are noted compared to prior exam, with the largest measuring approximately 6 mm in the right upper lobe best seen on image number 22 of series 8. No new nodules are noted. Stable probable scarring is noted in lingular segment of left upper lobe.   Upper Abdomen: No acute abnormality.   Musculoskeletal: No chest wall mass or suspicious bone lesions identified.    IMPRESSION: Stable bilateral pulmonary nodules are noted compared to prior exam, with the largest measuring 6 mm in right upper lobe. Follow-up unenhanced chest CT in 12 months is recommended to ensure stability.   Aortic Atherosclerosis (ICD10-I70.0) and Emphysema (ICD10-J43.9).     Electronically Signed   By: Marijo Conception M.D.   On: 02/19/2021 09:44 I personally reviewed the CT images.  There are multiple small lung nodules.  No nodules enlarged compared to her film from 6 months ago.  Impression: Mandy Delacruz is a 76 year old woman with a history of tobacco abuse, COPD, lung nodules, chronic bronchitis, asthma, aortic stenosis, hypertension, hiatal hernia, and reflux.    Lung nodules-has been followed for lung nodules dating back to 2019.  No new or suspicious nodules on today's exam.  She does have multiple nodules that remain somewhat worrisome.  We will repeat a CT in 6 months.  Aortic stenosis-moderate by echo in December 2021.  She will see Dr. Sallyanne Kuster in March.  We will have another echo at that time.  Does have some symptoms of chest tightness with exertion.  Hard to tell what may be due to AS versus COPD.  However, likely significant contribution from her aortic stenosis.  Tobacco abuse-I continue to smoke.  We discussed strategies for quitting with nicotine replacement.  She will try that and if not able will talk to Dr. Laurann Montana about medication.  Hypertension-systolic blood pressure elevated again today.'s needs to keep track of that.  I will defer to Dr. Sallyanne Kuster for medical management of that.  Plan: Monitor blood pressure at home Follow-up as scheduled with Dr. Sallyanne Kuster Return in 6 months with CT chest to follow-up lung nodules  Melrose Nakayama, MD [facility] [phone]

## 2021-03-12 DIAGNOSIS — E1169 Type 2 diabetes mellitus with other specified complication: Secondary | ICD-10-CM | POA: Diagnosis not present

## 2021-03-12 DIAGNOSIS — N1831 Chronic kidney disease, stage 3a: Secondary | ICD-10-CM | POA: Diagnosis not present

## 2021-03-12 DIAGNOSIS — I700 Atherosclerosis of aorta: Secondary | ICD-10-CM | POA: Diagnosis not present

## 2021-03-12 DIAGNOSIS — I129 Hypertensive chronic kidney disease with stage 1 through stage 4 chronic kidney disease, or unspecified chronic kidney disease: Secondary | ICD-10-CM | POA: Diagnosis not present

## 2021-07-12 ENCOUNTER — Other Ambulatory Visit: Payer: Self-pay | Admitting: Thoracic Surgery (Cardiothoracic Vascular Surgery)

## 2021-07-12 DIAGNOSIS — R911 Solitary pulmonary nodule: Secondary | ICD-10-CM

## 2021-07-19 ENCOUNTER — Other Ambulatory Visit: Payer: Self-pay | Admitting: Thoracic Surgery (Cardiothoracic Vascular Surgery)

## 2021-07-19 DIAGNOSIS — R911 Solitary pulmonary nodule: Secondary | ICD-10-CM

## 2021-08-20 ENCOUNTER — Ambulatory Visit: Payer: BC Managed Care – PPO | Admitting: Thoracic Surgery (Cardiothoracic Vascular Surgery)

## 2021-08-20 ENCOUNTER — Other Ambulatory Visit: Payer: BC Managed Care – PPO

## 2021-08-21 ENCOUNTER — Ambulatory Visit: Payer: Self-pay

## 2021-08-21 NOTE — Patient Outreach (Signed)
  Care Coordination   08/21/2021 Name: Mandy Delacruz MRN: [id number] DOB: June 20, 1945   Care Coordination Outreach Attempts:  An unsuccessful telephone outreach was attempted today to offer the patient information about available care coordination services as a benefit of their health plan.   Follow Up Plan:  Additional outreach attempts will be made to offer the patient care coordination information and services.   Encounter Outcome:  No Answer  Care Coordination Interventions Activated:  No   Care Coordination Interventions:  No, not indicated    [facility] [phone]

## 2021-09-02 ENCOUNTER — Ambulatory Visit: Payer: Self-pay

## 2021-09-02 NOTE — Patient Outreach (Signed)
  Care Coordination   09/02/2021 Name: Mandy Delacruz MRN: [id number] DOB: [birth year]   Care Coordination Outreach Attempts:  An unsuccessful telephone outreach was attempted today to offer the patient information about available care coordination services as a benefit of their health plan.   Follow Up Plan:  Additional outreach attempts will be made to offer the patient care coordination information and services.   Encounter Outcome:  No Answer  Care Coordination Interventions Activated:  No   Care Coordination Interventions:  No, not indicated    [facility] [phone]

## 2021-09-18 DIAGNOSIS — J449 Chronic obstructive pulmonary disease, unspecified: Secondary | ICD-10-CM | POA: Diagnosis not present

## 2021-09-18 DIAGNOSIS — R059 Cough, unspecified: Secondary | ICD-10-CM | POA: Diagnosis not present

## 2021-09-18 DIAGNOSIS — Z03818 Encounter for observation for suspected exposure to other biological agents ruled out: Secondary | ICD-10-CM | POA: Diagnosis not present

## 2021-09-30 DIAGNOSIS — E119 Type 2 diabetes mellitus without complications: Secondary | ICD-10-CM | POA: Diagnosis not present

## 2021-09-30 DIAGNOSIS — H52203 Unspecified astigmatism, bilateral: Secondary | ICD-10-CM | POA: Diagnosis not present

## 2021-09-30 DIAGNOSIS — H524 Presbyopia: Secondary | ICD-10-CM | POA: Diagnosis not present

## 2021-09-30 DIAGNOSIS — Z961 Presence of intraocular lens: Secondary | ICD-10-CM | POA: Diagnosis not present

## 2021-10-14 ENCOUNTER — Ambulatory Visit
Admission: RE | Admit: 2021-10-14 | Discharge: 2021-10-14 | Disposition: A | Discharge status: Home / Self Care | Payer: BC Managed Care – PPO | Source: Ambulatory Visit | Attending: Family Medicine | Admitting: Family Medicine

## 2021-10-14 ENCOUNTER — Other Ambulatory Visit: Payer: Self-pay | Admitting: Family Medicine

## 2021-10-14 DIAGNOSIS — R509 Fever, unspecified: Secondary | ICD-10-CM

## 2021-10-14 DIAGNOSIS — R197 Diarrhea, unspecified: Secondary | ICD-10-CM | POA: Diagnosis not present

## 2021-10-14 DIAGNOSIS — R110 Nausea: Secondary | ICD-10-CM | POA: Diagnosis not present

## 2021-10-21 ENCOUNTER — Other Ambulatory Visit: Payer: Self-pay | Admitting: Cardiology

## 2021-10-24 ENCOUNTER — Inpatient Hospital Stay (HOSPITAL_COMMUNITY)
Admission: EM | Admit: 2021-10-24 | Discharge: 2021-10-26 | DRG: 378 | Disposition: A | Discharge status: Home / Self Care | Payer: BC Managed Care – PPO | Attending: Internal Medicine | Admitting: Internal Medicine

## 2021-10-24 ENCOUNTER — Other Ambulatory Visit: Payer: Self-pay

## 2021-10-24 ENCOUNTER — Encounter (HOSPITAL_COMMUNITY): Payer: Self-pay | Admitting: Emergency Medicine

## 2021-10-24 DIAGNOSIS — M545 Low back pain, unspecified: Secondary | ICD-10-CM | POA: Diagnosis present

## 2021-10-24 DIAGNOSIS — I10 Essential (primary) hypertension: Secondary | ICD-10-CM | POA: Diagnosis not present

## 2021-10-24 DIAGNOSIS — D500 Iron deficiency anemia secondary to blood loss (chronic): Secondary | ICD-10-CM

## 2021-10-24 DIAGNOSIS — M1990 Unspecified osteoarthritis, unspecified site: Secondary | ICD-10-CM | POA: Diagnosis present

## 2021-10-24 DIAGNOSIS — Z79899 Other long term (current) drug therapy: Secondary | ICD-10-CM | POA: Diagnosis not present

## 2021-10-24 DIAGNOSIS — Z8249 Family history of ischemic heart disease and other diseases of the circulatory system: Secondary | ICD-10-CM | POA: Diagnosis not present

## 2021-10-24 DIAGNOSIS — Z881 Allergy status to other antibiotic agents status: Secondary | ICD-10-CM

## 2021-10-24 DIAGNOSIS — R911 Solitary pulmonary nodule: Secondary | ICD-10-CM | POA: Diagnosis present

## 2021-10-24 DIAGNOSIS — R9431 Abnormal electrocardiogram [ECG] [EKG]: Secondary | ICD-10-CM | POA: Diagnosis not present

## 2021-10-24 DIAGNOSIS — Z7951 Long term (current) use of inhaled steroids: Secondary | ICD-10-CM

## 2021-10-24 DIAGNOSIS — I350 Nonrheumatic aortic (valve) stenosis: Secondary | ICD-10-CM | POA: Diagnosis present

## 2021-10-24 DIAGNOSIS — K2970 Gastritis, unspecified, without bleeding: Secondary | ICD-10-CM | POA: Diagnosis not present

## 2021-10-24 DIAGNOSIS — Z885 Allergy status to narcotic agent status: Secondary | ICD-10-CM

## 2021-10-24 DIAGNOSIS — K921 Melena: Principal | ICD-10-CM | POA: Diagnosis present

## 2021-10-24 DIAGNOSIS — B9620 Unspecified Escherichia coli [E. coli] as the cause of diseases classified elsewhere: Secondary | ICD-10-CM | POA: Diagnosis not present

## 2021-10-24 DIAGNOSIS — K5730 Diverticulosis of large intestine without perforation or abscess without bleeding: Secondary | ICD-10-CM | POA: Diagnosis present

## 2021-10-24 DIAGNOSIS — Z87891 Personal history of nicotine dependence: Secondary | ICD-10-CM | POA: Diagnosis not present

## 2021-10-24 DIAGNOSIS — K922 Gastrointestinal hemorrhage, unspecified: Secondary | ICD-10-CM | POA: Diagnosis present

## 2021-10-24 DIAGNOSIS — E785 Hyperlipidemia, unspecified: Secondary | ICD-10-CM | POA: Diagnosis present

## 2021-10-24 DIAGNOSIS — I700 Atherosclerosis of aorta: Secondary | ICD-10-CM | POA: Diagnosis not present

## 2021-10-24 DIAGNOSIS — I110 Hypertensive heart disease with heart failure: Secondary | ICD-10-CM | POA: Diagnosis not present

## 2021-10-24 DIAGNOSIS — R059 Cough, unspecified: Secondary | ICD-10-CM | POA: Diagnosis not present

## 2021-10-24 DIAGNOSIS — I509 Heart failure, unspecified: Secondary | ICD-10-CM | POA: Diagnosis not present

## 2021-10-24 DIAGNOSIS — Z888 Allergy status to other drugs, medicaments and biological substances status: Secondary | ICD-10-CM

## 2021-10-24 DIAGNOSIS — D62 Acute posthemorrhagic anemia: Secondary | ICD-10-CM | POA: Diagnosis present

## 2021-10-24 DIAGNOSIS — D649 Anemia, unspecified: Secondary | ICD-10-CM | POA: Diagnosis not present

## 2021-10-24 DIAGNOSIS — F17210 Nicotine dependence, cigarettes, uncomplicated: Secondary | ICD-10-CM | POA: Diagnosis not present

## 2021-10-24 DIAGNOSIS — Z7982 Long term (current) use of aspirin: Secondary | ICD-10-CM

## 2021-10-24 DIAGNOSIS — E119 Type 2 diabetes mellitus without complications: Secondary | ICD-10-CM | POA: Diagnosis not present

## 2021-10-24 DIAGNOSIS — K219 Gastro-esophageal reflux disease without esophagitis: Secondary | ICD-10-CM | POA: Diagnosis not present

## 2021-10-24 DIAGNOSIS — R5383 Other fatigue: Secondary | ICD-10-CM | POA: Diagnosis not present

## 2021-10-24 DIAGNOSIS — J9801 Acute bronchospasm: Secondary | ICD-10-CM | POA: Diagnosis not present

## 2021-10-24 DIAGNOSIS — K449 Diaphragmatic hernia without obstruction or gangrene: Secondary | ICD-10-CM | POA: Diagnosis present

## 2021-10-24 DIAGNOSIS — N10 Acute tubulo-interstitial nephritis: Secondary | ICD-10-CM | POA: Diagnosis present

## 2021-10-24 DIAGNOSIS — G8929 Other chronic pain: Secondary | ICD-10-CM | POA: Diagnosis not present

## 2021-10-24 DIAGNOSIS — R195 Other fecal abnormalities: Secondary | ICD-10-CM | POA: Diagnosis not present

## 2021-10-24 DIAGNOSIS — Z1152 Encounter for screening for COVID-19: Secondary | ICD-10-CM

## 2021-10-24 DIAGNOSIS — Z8719 Personal history of other diseases of the digestive system: Secondary | ICD-10-CM | POA: Diagnosis not present

## 2021-10-24 DIAGNOSIS — Z9049 Acquired absence of other specified parts of digestive tract: Secondary | ICD-10-CM | POA: Diagnosis not present

## 2021-10-24 DIAGNOSIS — Q438 Other specified congenital malformations of intestine: Secondary | ICD-10-CM

## 2021-10-24 DIAGNOSIS — R3911 Hesitancy of micturition: Secondary | ICD-10-CM | POA: Diagnosis not present

## 2021-10-24 LAB — COMPREHENSIVE METABOLIC PANEL
ALT: 34 U/L (ref 0–44)
AST: 22 U/L (ref 15–41)
Albumin: 2.8 g/dL — ABNORMAL LOW (ref 3.5–5.0)
Alkaline Phosphatase: 101 U/L (ref 38–126)
Anion gap: 9 (ref 5–15)
BUN: 20 mg/dL (ref 8–23)
CO2: 24 mmol/L (ref 22–32)
Calcium: 9.6 mg/dL (ref 8.9–10.3)
Chloride: 101 mmol/L (ref 98–111)
Creatinine, Ser: 1.02 mg/dL — ABNORMAL HIGH (ref 0.44–1.00)
GFR, Estimated: 58 mL/min — ABNORMAL LOW (ref >60)
Glucose, Bld: 104 mg/dL — ABNORMAL HIGH (ref 70–99)
Potassium: 4.4 mmol/L (ref 3.5–5.1)
Sodium: 134 mmol/L — ABNORMAL LOW (ref 135–145)
Total Bilirubin: 0.6 mg/dL (ref 0.3–1.2)
Total Protein: 6.4 g/dL — ABNORMAL LOW (ref 6.5–8.1)

## 2021-10-24 LAB — URINALYSIS, ROUTINE W REFLEX MICROSCOPIC
Bilirubin Urine: NEGATIVE
Glucose, UA: NEGATIVE mg/dL
Ketones, ur: NEGATIVE mg/dL
Nitrite: NEGATIVE
Protein, ur: NEGATIVE mg/dL
Specific Gravity, Urine: 1.005 (ref 1.005–1.030)
pH: 7 (ref 5.0–8.0)

## 2021-10-24 LAB — CBC WITH DIFFERENTIAL/PLATELET
Abs Immature Granulocytes: 0.2 10*3/uL — ABNORMAL HIGH (ref 0.00–0.07)
Basophils Absolute: 0.1 10*3/uL (ref 0.0–0.1)
Basophils Relative: 1 %
Eosinophils Absolute: 0.1 10*3/uL (ref 0.0–0.5)
Eosinophils Relative: 2 %
HCT: 21.8 % — ABNORMAL LOW (ref 36.0–46.0)
Hemoglobin: 6.7 g/dL — CL (ref 12.0–15.0)
Immature Granulocytes: 3 %
Lymphocytes Relative: 24 %
Lymphs Abs: 1.7 10*3/uL (ref 0.7–4.0)
MCH: 27.7 pg (ref 26.0–34.0)
MCHC: 30.7 g/dL (ref 30.0–36.0)
MCV: 90.1 fL (ref 80.0–100.0)
Monocytes Absolute: 0.6 10*3/uL (ref 0.1–1.0)
Monocytes Relative: 9 %
Neutro Abs: 4.2 10*3/uL (ref 1.7–7.7)
Neutrophils Relative %: 61 %
Platelets: 337 10*3/uL (ref 150–400)
RBC: 2.42 MIL/uL — ABNORMAL LOW (ref 3.87–5.11)
RDW: 17.7 % — ABNORMAL HIGH (ref 11.5–15.5)
WBC: 6.8 10*3/uL (ref 4.0–10.5)
nRBC: 0 % (ref 0.0–0.2)

## 2021-10-24 LAB — LACTIC ACID, PLASMA: Lactic Acid, Venous: 0.7 mmol/L (ref 0.5–1.9)

## 2021-10-24 LAB — ABO/RH: ABO/RH(D): A NEG

## 2021-10-24 NOTE — ED Provider Triage Note (Signed)
Emergency Medicine Provider Triage Evaluation Note  Mandy Delacruz , a 76 y.o. female  was evaluated in triage.  Pt complains of fever and low hemoglobin.  Has been sick for about the past month, first with PNA, then GI bug, then unexplained fevers all last week.  Saw PCP today for re-check and notified that hemoglobin was low at 7.2.  sent to the ER for further evaluation of unexplained fever and possible transfusion. Denies GI bleeding but reports stool has seemed dark.  She takes daily ASA and has been on prednisone recently.  Review of Systems  Positive: GI bleeding, fever Negative: syncope  Physical Exam  BP 121/60   Pulse 85   Temp 98.1 F (36.7 C) (Oral)   Resp 19   SpO2 100%  Gen:   Awake, no distress   Resp:  Normal effort  MSK:   Moves extremities without difficulty  Other:    Medical Decision Making  Medically screening exam initiated at 10:15 PM.  Appropriate orders placed.  Mandy Delacruz was informed that the remainder of the evaluation will be completed by another provider, this initial triage assessment does not replace that evaluation, and the importance of remaining in the ED until their evaluation is complete.  Abnormal labs, unexplained fever.  Afebrile in triage currently.  Will check labs including lactic, cultures, covid screen, CXR, type and screen.   Larene Pickett, PA-C 10/24/21 2217

## 2021-10-24 NOTE — ED Triage Notes (Signed)
Patient received a call from her PCP advised her to go to ER due to low Hgb=7.5 taken this morning . She adds occasional dark stools , no anticoagulant medications , takes ASA .

## 2021-10-25 ENCOUNTER — Inpatient Hospital Stay (HOSPITAL_COMMUNITY): Payer: BC Managed Care – PPO | Admitting: Anesthesiology

## 2021-10-25 ENCOUNTER — Emergency Department (HOSPITAL_COMMUNITY): Payer: BC Managed Care – PPO

## 2021-10-25 ENCOUNTER — Encounter (HOSPITAL_COMMUNITY): Payer: Self-pay

## 2021-10-25 ENCOUNTER — Encounter (HOSPITAL_COMMUNITY)
Admission: EM | Disposition: A | Discharge status: Home / Self Care | Payer: Self-pay | Source: Home / Self Care | Attending: Internal Medicine

## 2021-10-25 DIAGNOSIS — K921 Melena: Secondary | ICD-10-CM | POA: Diagnosis present

## 2021-10-25 DIAGNOSIS — D649 Anemia, unspecified: Secondary | ICD-10-CM

## 2021-10-25 DIAGNOSIS — I10 Essential (primary) hypertension: Secondary | ICD-10-CM | POA: Diagnosis present

## 2021-10-25 DIAGNOSIS — F17210 Nicotine dependence, cigarettes, uncomplicated: Secondary | ICD-10-CM | POA: Diagnosis present

## 2021-10-25 DIAGNOSIS — R195 Other fecal abnormalities: Secondary | ICD-10-CM | POA: Diagnosis not present

## 2021-10-25 DIAGNOSIS — Z7982 Long term (current) use of aspirin: Secondary | ICD-10-CM | POA: Diagnosis not present

## 2021-10-25 DIAGNOSIS — K5730 Diverticulosis of large intestine without perforation or abscess without bleeding: Secondary | ICD-10-CM | POA: Diagnosis present

## 2021-10-25 DIAGNOSIS — D62 Acute posthemorrhagic anemia: Secondary | ICD-10-CM | POA: Diagnosis present

## 2021-10-25 DIAGNOSIS — K922 Gastrointestinal hemorrhage, unspecified: Secondary | ICD-10-CM | POA: Diagnosis present

## 2021-10-25 DIAGNOSIS — K219 Gastro-esophageal reflux disease without esophagitis: Secondary | ICD-10-CM | POA: Diagnosis present

## 2021-10-25 DIAGNOSIS — R911 Solitary pulmonary nodule: Secondary | ICD-10-CM | POA: Diagnosis present

## 2021-10-25 DIAGNOSIS — M545 Low back pain, unspecified: Secondary | ICD-10-CM | POA: Diagnosis present

## 2021-10-25 DIAGNOSIS — Z1152 Encounter for screening for COVID-19: Secondary | ICD-10-CM | POA: Diagnosis not present

## 2021-10-25 DIAGNOSIS — E785 Hyperlipidemia, unspecified: Secondary | ICD-10-CM | POA: Diagnosis present

## 2021-10-25 DIAGNOSIS — I350 Nonrheumatic aortic (valve) stenosis: Secondary | ICD-10-CM | POA: Diagnosis present

## 2021-10-25 DIAGNOSIS — K449 Diaphragmatic hernia without obstruction or gangrene: Secondary | ICD-10-CM | POA: Diagnosis present

## 2021-10-25 DIAGNOSIS — E119 Type 2 diabetes mellitus without complications: Secondary | ICD-10-CM

## 2021-10-25 DIAGNOSIS — G8929 Other chronic pain: Secondary | ICD-10-CM | POA: Diagnosis present

## 2021-10-25 DIAGNOSIS — N10 Acute tubulo-interstitial nephritis: Secondary | ICD-10-CM | POA: Diagnosis present

## 2021-10-25 DIAGNOSIS — Z7951 Long term (current) use of inhaled steroids: Secondary | ICD-10-CM | POA: Diagnosis not present

## 2021-10-25 DIAGNOSIS — D500 Iron deficiency anemia secondary to blood loss (chronic): Secondary | ICD-10-CM | POA: Diagnosis not present

## 2021-10-25 DIAGNOSIS — Z881 Allergy status to other antibiotic agents status: Secondary | ICD-10-CM | POA: Diagnosis not present

## 2021-10-25 DIAGNOSIS — Z8249 Family history of ischemic heart disease and other diseases of the circulatory system: Secondary | ICD-10-CM | POA: Diagnosis not present

## 2021-10-25 DIAGNOSIS — K2970 Gastritis, unspecified, without bleeding: Secondary | ICD-10-CM | POA: Diagnosis present

## 2021-10-25 DIAGNOSIS — Q438 Other specified congenital malformations of intestine: Secondary | ICD-10-CM | POA: Diagnosis not present

## 2021-10-25 DIAGNOSIS — M1990 Unspecified osteoarthritis, unspecified site: Secondary | ICD-10-CM | POA: Diagnosis present

## 2021-10-25 DIAGNOSIS — Z79899 Other long term (current) drug therapy: Secondary | ICD-10-CM | POA: Diagnosis not present

## 2021-10-25 DIAGNOSIS — B9620 Unspecified Escherichia coli [E. coli] as the cause of diseases classified elsewhere: Secondary | ICD-10-CM | POA: Diagnosis present

## 2021-10-25 HISTORY — PX: ESOPHAGOGASTRODUODENOSCOPY (EGD) WITH PROPOFOL: SHX5813

## 2021-10-25 LAB — HEMOGLOBIN AND HEMATOCRIT, BLOOD
HCT: 28.9 % — ABNORMAL LOW (ref 36.0–46.0)
Hemoglobin: 9.7 g/dL — ABNORMAL LOW (ref 12.0–15.0)

## 2021-10-25 LAB — CBG MONITORING, ED
Glucose-Capillary: 135 mg/dL — ABNORMAL HIGH (ref 70–99)
Glucose-Capillary: 120 mg/dL — ABNORMAL HIGH (ref 70–99)

## 2021-10-25 LAB — CBC
HCT: 25.4 % — ABNORMAL LOW (ref 36.0–46.0)
Hemoglobin: 8.2 g/dL — ABNORMAL LOW (ref 12.0–15.0)
MCH: 28.7 pg (ref 26.0–34.0)
MCHC: 32.3 g/dL (ref 30.0–36.0)
MCV: 88.8 fL (ref 80.0–100.0)
Platelets: 307 10*3/uL (ref 150–400)
RBC: 2.86 MIL/uL — ABNORMAL LOW (ref 3.87–5.11)
RDW: 16.4 % — ABNORMAL HIGH (ref 11.5–15.5)
WBC: 6.4 10*3/uL (ref 4.0–10.5)
nRBC: 0 % (ref 0.0–0.2)

## 2021-10-25 LAB — BASIC METABOLIC PANEL
Anion gap: 9 (ref 5–15)
BUN: 17 mg/dL (ref 8–23)
CO2: 22 mmol/L (ref 22–32)
Calcium: 9.3 mg/dL (ref 8.9–10.3)
Chloride: 103 mmol/L (ref 98–111)
Creatinine, Ser: 0.98 mg/dL (ref 0.44–1.00)
GFR, Estimated: >60 mL/min (ref >60)
Glucose, Bld: 127 mg/dL — ABNORMAL HIGH (ref 70–99)
Potassium: 4.5 mmol/L (ref 3.5–5.1)
Sodium: 134 mmol/L — ABNORMAL LOW (ref 135–145)

## 2021-10-25 LAB — GLUCOSE, CAPILLARY
Glucose-Capillary: 145 mg/dL — ABNORMAL HIGH (ref 70–99)
Glucose-Capillary: 140 mg/dL — ABNORMAL HIGH (ref 70–99)

## 2021-10-25 LAB — HEMOGLOBIN A1C
Hgb A1c MFr Bld: 6 % — ABNORMAL HIGH (ref 4.8–5.6)
Mean Plasma Glucose: 125.5 mg/dL

## 2021-10-25 LAB — RESP PANEL BY RT-PCR (FLU A&B, COVID) ARPGX2
Influenza A by PCR: NEGATIVE
Influenza B by PCR: NEGATIVE
SARS Coronavirus 2 by RT PCR: NEGATIVE

## 2021-10-25 LAB — PREPARE RBC (CROSSMATCH)

## 2021-10-25 LAB — IRON AND TIBC
Iron: 64 ug/dL (ref 28–170)
Saturation Ratios: 17 % (ref 10.4–31.8)
TIBC: 370 ug/dL (ref 250–450)
UIBC: 306 ug/dL

## 2021-10-25 LAB — POC OCCULT BLOOD, ED: Fecal Occult Bld: POSITIVE — AB

## 2021-10-25 LAB — FERRITIN: Ferritin: 5 ng/mL — ABNORMAL LOW (ref 11–307)

## 2021-10-25 SURGERY — ESOPHAGOGASTRODUODENOSCOPY (EGD) WITH PROPOFOL
Anesthesia: Monitor Anesthesia Care

## 2021-10-25 MED ORDER — PROPOFOL 500 MG/50ML IV EMUL
INTRAVENOUS | Status: DC | PRN
  Administered 2021-10-25: 150 ug/kg/min via INTRAVENOUS

## 2021-10-25 MED ORDER — PANTOPRAZOLE SODIUM 40 MG IV SOLR: 40.0000 mg | Freq: Two times a day (BID) | INTRAVENOUS | Status: DC

## 2021-10-25 MED ORDER — PEG-KCL-NACL-NASULF-NA ASC-C 100 G PO SOLR
0.5000 | Freq: Once | ORAL | Status: AC
  Administered 2021-10-26: 100 g via ORAL

## 2021-10-25 MED ORDER — PROPOFOL 10 MG/ML IV BOLUS
INTRAVENOUS | Status: DC | PRN
  Administered 2021-10-25: 70 mg via INTRAVENOUS
  Administered 2021-10-25: 30 mg via INTRAVENOUS

## 2021-10-25 MED ORDER — SODIUM CHLORIDE 0.9 % IV SOLN
1.0000 g | INTRAVENOUS | Status: DC
  Administered 2021-10-26: 1 g via INTRAVENOUS
  Filled 2021-10-25: qty 10

## 2021-10-25 MED ORDER — ACETAMINOPHEN 325 MG PO TABS: 650.0000 mg | ORAL_TABLET | Freq: Four times a day (QID) | ORAL | Status: DC | PRN

## 2021-10-25 MED ORDER — ALBUTEROL SULFATE (2.5 MG/3ML) 0.083% IN NEBU: 2.5000 mg | INHALATION_SOLUTION | Freq: Four times a day (QID) | RESPIRATORY_TRACT | Status: DC | PRN

## 2021-10-25 MED ORDER — PEG-KCL-NACL-NASULF-NA ASC-C 100 G PO SOLR
0.5000 | Freq: Once | ORAL | Status: AC
  Administered 2021-10-25: 100 g via ORAL
  Filled 2021-10-25: qty 1

## 2021-10-25 MED ORDER — SODIUM CHLORIDE 0.9 % IV SOLN: INTRAVENOUS | Status: DC | PRN

## 2021-10-25 MED ORDER — SODIUM CHLORIDE 0.9 % IV SOLN
2.0000 g | Freq: Once | INTRAVENOUS | Status: AC
  Administered 2021-10-25: 2 g via INTRAVENOUS
  Filled 2021-10-25: qty 20

## 2021-10-25 MED ORDER — MOMETASONE FURO-FORMOTEROL FUM 100-5 MCG/ACT IN AERO
2.0000 | INHALATION_SPRAY | Freq: Two times a day (BID) | RESPIRATORY_TRACT | Status: DC
  Administered 2021-10-25: 2 via RESPIRATORY_TRACT
  Filled 2021-10-25: qty 8.8

## 2021-10-25 MED ORDER — ONDANSETRON HCL 4 MG/2ML IJ SOLN
INTRAMUSCULAR | Status: DC | PRN
  Administered 2021-10-25: 4 mg via INTRAVENOUS

## 2021-10-25 MED ORDER — ONDANSETRON HCL 4 MG/2ML IJ SOLN
4.0000 mg | Freq: Once | INTRAMUSCULAR | Status: AC
  Administered 2021-10-25: 4 mg via INTRAVENOUS
  Filled 2021-10-25: qty 2

## 2021-10-25 MED ORDER — IOHEXOL 350 MG/ML SOLN
100.0000 mL | Freq: Once | INTRAVENOUS | Status: AC | PRN
  Administered 2021-10-25: 100 mL via INTRAVENOUS

## 2021-10-25 MED ORDER — ACETAMINOPHEN 650 MG RE SUPP: 650.0000 mg | Freq: Four times a day (QID) | RECTAL | Status: DC | PRN

## 2021-10-25 MED ORDER — DEXAMETHASONE SODIUM PHOSPHATE 10 MG/ML IJ SOLN
INTRAMUSCULAR | Status: DC | PRN
  Administered 2021-10-25: 4 mg via INTRAVENOUS

## 2021-10-25 MED ORDER — PANTOPRAZOLE INFUSION (NEW) - SIMPLE MED
8.0000 mg/h | INTRAVENOUS | Status: DC
  Administered 2021-10-25: 8 mg/h via INTRAVENOUS
  Filled 2021-10-25 (×2): qty 100

## 2021-10-25 MED ORDER — PANTOPRAZOLE SODIUM 40 MG IV SOLR
40.0000 mg | Freq: Two times a day (BID) | INTRAVENOUS | Status: DC
  Administered 2021-10-25 – 2021-10-26 (×2): 40 mg via INTRAVENOUS
  Filled 2021-10-25 (×2): qty 10

## 2021-10-25 MED ORDER — PEG-KCL-NACL-NASULF-NA ASC-C 100 G PO SOLR: 1.0000 | Freq: Once | ORAL | Status: DC

## 2021-10-25 MED ORDER — PANTOPRAZOLE SODIUM 40 MG IV SOLR
40.0000 mg | Freq: Once | INTRAVENOUS | Status: AC
  Administered 2021-10-25: 40 mg via INTRAVENOUS
  Filled 2021-10-25: qty 10

## 2021-10-25 MED ORDER — INSULIN ASPART 100 UNIT/ML IJ SOLN
0.0000 [IU] | INTRAMUSCULAR | Status: DC
  Administered 2021-10-25 (×3): 1 [IU] via SUBCUTANEOUS
  Administered 2021-10-26: 2 [IU] via SUBCUTANEOUS

## 2021-10-25 MED ORDER — LORATADINE 10 MG PO TABS
10.0000 mg | ORAL_TABLET | Freq: Every day | ORAL | Status: DC | PRN
  Filled 2021-10-25: qty 1

## 2021-10-25 MED ORDER — SODIUM CHLORIDE 0.9 % IV SOLN
10.0000 mL/h | Freq: Once | INTRAVENOUS | Status: AC
  Administered 2021-10-25: 10 mL/h via INTRAVENOUS

## 2021-10-25 MED ORDER — PRAVASTATIN SODIUM 40 MG PO TABS
40.0000 mg | ORAL_TABLET | Freq: Every evening | ORAL | Status: DC
  Administered 2021-10-25: 40 mg via ORAL
  Filled 2021-10-25: qty 1

## 2021-10-25 MED ORDER — PANTOPRAZOLE 80MG IVPB - SIMPLE MED
80.0000 mg | Freq: Once | INTRAVENOUS | Status: AC
  Administered 2021-10-25: 80 mg via INTRAVENOUS
  Filled 2021-10-25 (×3): qty 100

## 2021-10-25 MED ORDER — LIDOCAINE 2% (20 MG/ML) 5 ML SYRINGE
INTRAMUSCULAR | Status: DC | PRN
  Administered 2021-10-25: 40 mg via INTRAVENOUS
  Administered 2021-10-25: 60 mg via INTRAVENOUS

## 2021-10-25 MED ORDER — ONDANSETRON HCL 4 MG/2ML IJ SOLN: 4.0000 mg | Freq: Four times a day (QID) | INTRAMUSCULAR | Status: DC | PRN

## 2021-10-25 MED ORDER — ONDANSETRON HCL 4 MG PO TABS: 4.0000 mg | ORAL_TABLET | Freq: Four times a day (QID) | ORAL | Status: DC | PRN

## 2021-10-25 SURGICAL SUPPLY — 15 items

## 2021-10-25 NOTE — Transfer of Care (Signed)
Immediate Anesthesia Transfer of Care Note  Patient: Mandy Delacruz  Procedure(s) Performed: ESOPHAGOGASTRODUODENOSCOPY (EGD) WITH PROPOFOL  Patient Location: PACU  Anesthesia Type:MAC  Level of Consciousness: awake, alert  and oriented  Airway & Oxygen Therapy: Patient Spontanous Breathing  Post-op Assessment: Report given to RN and Post -op Vital signs reviewed and stable  Post vital signs: Reviewed and stable  Last Vitals:  Vitals Value Taken Time  BP 111/65 10/25/21 1337  Temp 36.6 C 10/25/21 1337  Pulse 71 10/25/21 1340  Resp 20 10/25/21 1340  SpO2 96 % 10/25/21 1340  Vitals shown include unvalidated device data.  Last Pain:  Vitals:   10/25/21 1337  TempSrc: Temporal  PainSc: 0-No pain         Complications: No notable events documented.

## 2021-10-25 NOTE — Anesthesia Postprocedure Evaluation (Signed)
Anesthesia Post Note  Patient: Mandy Delacruz  Procedure(s) Performed: ESOPHAGOGASTRODUODENOSCOPY (EGD) WITH PROPOFOL     Patient location during evaluation: PACU Anesthesia Type: MAC Level of consciousness: awake and alert Pain management: pain level controlled Vital Signs Assessment: post-procedure vital signs reviewed and stable Respiratory status: spontaneous breathing, nonlabored ventilation and respiratory function stable Cardiovascular status: blood pressure returned to baseline and stable Postop Assessment: no apparent nausea or vomiting Anesthetic complications: no   No notable events documented.  Last Vitals:  Vitals:   10/25/21 1400 10/25/21 1434  BP: 120/60 130/61  Pulse: 68 72  Resp: 17 17  Temp:  36.6 C  SpO2: 96% 100%    Last Pain:  Vitals:   10/25/21 1434  TempSrc: Oral  PainSc:                  Pervis Hocking

## 2021-10-25 NOTE — ED Provider Notes (Signed)
[facility] EMERGENCY DEPARTMENT Provider Note   CSN: [id number] Arrival date & time: 10/24/21  2040     History  Chief Complaint  Patient presents with   Low Hgb=7.2    Mandy Delacruz is a 76 y.o. female.  76 yo F here from PCP recommendation secondary to anemia. Patient hasn't felt well for multiple weeks. As best I can tell it started with a respiratory illness first week of September. Got over that. Then about 12 days ago she had an episode of diarrhea that lasted about 12 hours. Then had nausea. Felt ok for a day then had nausea with fevers that lasted about a week. Saw her PCP with no obvious causes. Fever subsided for the last 5 days or so. Over last few days has been weak, DOE and generally unwell. Has had some back pain throughout this as well. Increased urination but also drinking more water.         Home Medications Prior to Admission medications   Medication Sig Start Date End Date Taking? Authorizing Provider  Albuterol Sulfate (PROAIR HFA IN) Inhale 2 puffs into the lungs 4 (four) times daily as needed (wheezing).    Yes [provider]  diphenhydrAMINE (BENADRYL) 25 MG tablet Take 25 mg by mouth at bedtime.   Yes [provider]  fexofenadine (ALLEGRA) 180 MG tablet Take 180 mg by mouth daily as needed for allergies or rhinitis.   Yes [provider]  ibuprofen (ADVIL,MOTRIN) 200 MG tablet Take 400 mg by mouth every morning.    Yes [provider]  losartan-hydrochlorothiazide (HYZAAR) 50-12.5 MG tablet Take 1/2 tablet by mouth once daily 12/09/14  Yes [provider]  omeprazole (PRILOSEC OTC) 20 MG tablet Take 20 mg by mouth at bedtime.    Yes [provider]  pravastatin (PRAVACHOL) 40 MG tablet TAKE 1 TABLET(40 MG) BY MOUTH EVERY EVENING Patient taking differently: Take 40 mg by mouth every evening. 10/21/21  Yes Donato Heinz, MD  SYMBICORT 80-4.5 MCG/ACT inhaler Take 2 puffs by  mouth 2 (two) times daily. 07/06/17  Yes [provider]      Allergies    Doxycycline hyclate, Ace inhibitors, Hydrocodone bit-homatrop mbr, and Codeine    Review of Systems   Review of Systems  Physical Exam Updated Vital Signs BP (!) 124/57   Pulse 85   Temp 98.1 F (36.7 C) (Oral)   Resp 18   SpO2 99%  Physical Exam Vitals and nursing note reviewed.  Constitutional:      Appearance: She is well-developed.  HENT:     Head: Normocephalic and atraumatic.     Nose: No congestion or rhinorrhea.     Mouth/Throat:     Mouth: Mucous membranes are dry.     Pharynx: Oropharynx is clear.  Eyes:     Pupils: Pupils are equal, round, and reactive to light.  Cardiovascular:     Rate and Rhythm: Normal rate and regular rhythm.  Pulmonary:     Effort: No respiratory distress.     Breath sounds: No stridor.  Abdominal:     General: Abdomen is flat. There is no distension.  Musculoskeletal:     Cervical back: Normal range of motion.  Skin:    General: Skin is warm and dry.     Coloration: Skin is pale.  Neurological:     General: No focal deficit present.     Mental Status: She is alert.     ED  Results / Procedures / Treatments   Labs (all labs ordered are listed, but only abnormal results are displayed) Labs Reviewed  CBC WITH DIFFERENTIAL/PLATELET - Abnormal; Notable for the following components:      Result Value   RBC 2.42 (*)    Hemoglobin 6.7 (*)    HCT 21.8 (*)    RDW 17.7 (*)    Abs Immature Granulocytes 0.20 (*)    All other components within normal limits  COMPREHENSIVE METABOLIC PANEL - Abnormal; Notable for the following components:   Sodium 134 (*)    Glucose, Bld 104 (*)    Creatinine, Ser 1.02 (*)    Total Protein 6.4 (*)    Albumin 2.8 (*)    GFR, Estimated 58 (*)    All other components within normal limits  URINALYSIS, ROUTINE W REFLEX MICROSCOPIC - Abnormal; Notable for the following components:   Color, Urine STRAW (*)    Hgb urine  dipstick SMALL (*)    Leukocytes,Ua SMALL (*)    Bacteria, UA MANY (*)    All other components within normal limits  POC OCCULT BLOOD, ED - Abnormal; Notable for the following components:   Fecal Occult Bld POSITIVE (*)    All other components within normal limits  CBG MONITORING, ED - Abnormal; Notable for the following components:   Glucose-Capillary 135 (*)    All other components within normal limits  RESP PANEL BY RT-PCR (FLU A&B, COVID) ARPGX2  CULTURE, BLOOD (ROUTINE X 2)  CULTURE, BLOOD (ROUTINE X 2)  URINE CULTURE  LACTIC ACID, PLASMA  OCCULT BLOOD X 1 CARD TO LAB, STOOL  CBC  BASIC METABOLIC PANEL  HEMOGLOBIN A1C  TYPE AND SCREEN  ABO/RH  PREPARE RBC (CROSSMATCH)    EKG None  Radiology CT ANGIO GI BLEED  Result Date: 10/25/2021 CLINICAL DATA:  Lower GI bleed EXAM: CTA ABDOMEN AND PELVIS WITHOUT AND WITH CONTRAST TECHNIQUE: Multidetector CT imaging of the abdomen and pelvis was performed using the standard protocol during bolus administration of intravenous contrast. Multiplanar reconstructed images and MIPs were obtained and reviewed to evaluate the vascular anatomy. RADIATION DOSE REDUCTION: This exam was performed according to the departmental dose-optimization program which includes automated exposure control, adjustment of the mA and/or kV according to patient size and/or use of iterative reconstruction technique. CONTRAST:  133m OMNIPAQUE IOHEXOL 350 MG/ML SOLN COMPARISON:  None Available. FINDINGS: VASCULAR Aorta: Aortic atherosclerosis.  No dissection or aneurysm. Celiac: Patent without evidence of aneurysm, dissection, vasculitis or significant stenosis. SMA: Patent without evidence of aneurysm, dissection, vasculitis or significant stenosis. Renals: Both renal arteries are patent without evidence of aneurysm, dissection, vasculitis, fibromuscular dysplasia or significant stenosis. IMA: Severe stenosis at the IMA origin, but otherwise normal. Inflow: Patent without  evidence of aneurysm, dissection, vasculitis or significant stenosis. Proximal Outflow: Bilateral common femoral and visualized portions of the superficial and profunda femoral arteries are patent without evidence of aneurysm, dissection, vasculitis or significant stenosis. Veins: No obvious venous abnormality within the limitations of this arterial phase study. Review of the MIP images confirms the above findings. NON-VASCULAR Lower chest: No acute abnormality. Hepatobiliary: No focal liver abnormality is seen. Status post cholecystectomy. No biliary dilatation. Enhancing focus in the left hepatic lobe is likely a hemangioma. Pancreas: Unremarkable. No pancreatic ductal dilatation or surrounding inflammatory changes. Spleen: Normal in size without focal abnormality. Adrenals/Urinary Tract: There is mild inhomogeneity of the parenchymal contrast enhancement pattern of the right kidney. Stomach/Bowel: No gastrointestinal hemorrhage identified. No small bowel obstruction or  focal colonic abnormality. Lymphatic: No lymphadenopathy Reproductive: Uterus and bilateral adnexa are unremarkable. Other: Negative Musculoskeletal: Negative IMPRESSION: 1. No gastrointestinal hemorrhage identified. 2. Mild inhomogeneity of the parenchymal contrast enhancement pattern of the right kidney. Correlate with urinalysis to exclude pyelonephritis. Aortic Atherosclerosis (ICD10-I70.0). Electronically Signed   By: Ulyses Jarred M.D.   On: 10/25/2021 03:29    Procedures .Critical Care  Performed by: Merrily Pew, MD Authorized by: Merrily Pew, MD   Critical care provider statement:    Critical care time (minutes):  30   Critical care was necessary to treat or prevent imminent or life-threatening deterioration of the following conditions:  Circulatory failure   Critical care was time spent personally by me on the following activities:  Development of treatment plan with patient or surrogate, discussions with consultants,  evaluation of patient's response to treatment, examination of patient, ordering and review of laboratory studies, ordering and review of radiographic studies, ordering and performing treatments and interventions, pulse oximetry, re-evaluation of patient's condition and review of old charts   Medications Ordered in ED Medications  acetaminophen (TYLENOL) tablet 650 mg (has no administration in time range)    Or  acetaminophen (TYLENOL) suppository 650 mg (has no administration in time range)  ondansetron (ZOFRAN) tablet 4 mg (has no administration in time range)    Or  ondansetron (ZOFRAN) injection 4 mg (has no administration in time range)  insulin aspart (novoLOG) injection 0-9 Units (has no administration in time range)  pantoprazole (PROTONIX) 80 mg /NS 100 mL IVPB (has no administration in time range)  pantoprozole (PROTONIX) 80 mg /NS 100 mL infusion (has no administration in time range)  pantoprazole (PROTONIX) injection 40 mg (has no administration in time range)  mometasone-formoterol (DULERA) 100-5 MCG/ACT inhaler 2 puff (has no administration in time range)  pravastatin (PRAVACHOL) tablet 40 mg (has no administration in time range)  albuterol (PROVENTIL) (2.5 MG/3ML) 0.083% nebulizer solution 2.5 mg (has no administration in time range)  loratadine (CLARITIN) tablet 10 mg (has no administration in time range)  pantoprazole (PROTONIX) injection 40 mg (40 mg Intravenous Given 10/25/21 0142)  ondansetron (ZOFRAN) injection 4 mg (4 mg Intravenous Given 10/25/21 0142)  0.9 %  sodium chloride infusion (10 mL/hr Intravenous New Bag/Given 10/25/21 0143)  iohexol (OMNIPAQUE) 350 MG/ML injection 100 mL (100 mLs Intravenous Contrast Given 10/25/21 0308)  cefTRIAXone (ROCEPHIN) 2 g in sodium chloride 0.9 % 100 mL IVPB (0 g Intravenous Stopped 10/25/21 0355)    ED Course/ Medical Decision Making/ A&P                           Medical Decision Making Amount and/or Complexity of Data  Reviewed Independent Historian: spouse    Details: Confirmed patient story Labs: ordered.    Details: Cbc low, rest of labs unremarkable. Radiology: ordered and independent interpretation performed.    Details: CT a/p without obvious GI bleed.  ECG/medicine tests: ordered.  Risk Prescription drug management. Decision regarding hospitalization.  Hemoglobin here is 6.7 without other obvious abnormalities. Her UA appears infected as well. Will perform hemoccult. Likely ct A/P as well. Will transfuse but disposition will depend on imaging findings.  Hemoccult positive. No obvious lower causes on CT. Suspect upper GI, will admit for w/u of same. [facility] consulted per institutional protocol. D/w hospitalist for admission.   Final Clinical Impression(s) / ED Diagnoses Final diagnoses:  Anemia, unspecified type  Gastrointestinal hemorrhage with melena    Rx /  DC Orders ED Discharge Orders     None         Jayna Mulnix, Corene Cornea, MD 10/25/21 [phone]

## 2021-10-25 NOTE — Assessment & Plan Note (Signed)
Due to GIB 1. 2u PRBC transfusion ordered 2. Repeat CBC post transfusion.

## 2021-10-25 NOTE — H&P (Signed)
History and Physical    Patient: Mandy Delacruz UVO:[id number] DOB: Sep 08, 1945 DOA: 10/24/2021 DOS: the patient was seen and examined on 10/25/2021 PCP: [name], MD  Patient coming from: Home  Chief Complaint:  Chief Complaint  Patient presents with   Low Hgb=7.2   HPI: Mandy Delacruz is a 76 y.o. female with medical history significant of HTN, HLD.  Pt with intermittent dark stools.  'Diarrhea' with 'very dark' stool ~ 12 days ago.  Lasted ~ 12h.  Felt okay but then had nausea nad fevers for ~1 week.  Fever improved over last 5 days but just having ongoing generalized weakness, DOE.  Went to PCP, HGB 7.2, in to ED.  Pt with chronic low back pain for several years now.  She does admit to taking Ibuprofen "every single day" for past couple of years.  She estimates she started daily Ibuprofen around 2021 or so she thinks.   Review of Systems: As mentioned in the history of present illness. All other systems reviewed and are negative. Past Medical History:  Diagnosis Date   Aortic stenosis    Asthma    Bronchitis    on bactrin since 12-27-14   Colon polyps 2001   Diverticulosis of colon (without mention of hemorrhage) 2001   Dizziness    inner ear problem at times, saw dr  [name] for   GERD (gastroesophageal reflux disease)    Heart murmur    Hiatal hernia 2001   Hyperlipidemia    Hypertension    IH (inguinal hernia)    left   Lung nodule    Other esophagitis 2001   Tobacco abuse    Past Surgical History:  Procedure Laterality Date   breast biospy     left, benign   CHOLECYSTECTOMY     COLONOSCOPY     ENDOVENOUS ABLATION SAPHENOUS VEIN W/ LASER Left 04/29/2016   endovenous laser ablation L SSV and stab phlebectomy left leg by Tinnie Gens MD    Mark  oct 2015   left   INGUINAL HERNIA REPAIR Left 01/10/2015   Procedure: OPEN REPAIR OF RECURRENT LEFT INGUINAL HERNIA ;  Surgeon: Greer Pickerel, MD;  Location: [facility];  Service: General;   Laterality: Left;   INSERTION OF MESH Left 01/10/2015   Procedure: INSERTION OF MESH;  Surgeon: Greer Pickerel, MD;  Location: [facility];  Service: General;  Laterality: Left;   INSERTION OF MESH N/A 08/18/2017   Procedure: INSERTION OF MESH;  Surgeon: Greer Pickerel, MD;  Location: [place];  Service: General;  Laterality: N/A;   LAPAROSCOPY N/A 01/10/2015   Procedure: LAPAROSCOPY DIAGNOSTIC;  Surgeon: Greer Pickerel, MD;  Location: [facility];  Service: General;  Laterality: N/A;   ORIF HIP FRACTURE  2006   3 pins, right   UMBILICAL HERNIA REPAIR N/A 08/18/2017   Procedure: LAPAROSCOPIC-ASSISTED UMBILICAL HERNIA  REPAIR ERAS PATHWAY;  Surgeon: Greer Pickerel, MD;  Location: [place];  Service: General;  Laterality: N/A;   Social History:  reports that she has quit smoking. Her smoking use included cigarettes. She has never used smokeless tobacco. She reports current alcohol use. She reports that she does not use drugs.  Allergies  Allergen Reactions   Doxycycline Hyclate Rash    Blisters on hands and feet   Ace Inhibitors     Other reaction(s): cough   Hydrocodone Bit-Homatrop Mbr Nausea Only   Codeine Nausea Only    Family History  Problem Relation Age of Onset  COPD Mother    Hypertension Father    Celiac disease Other        neice, Sister's daughter   Colon cancer Neg Hx    Esophageal cancer Neg Hx    Rectal cancer Neg Hx    Stomach cancer Neg Hx     Prior to Admission medications   Medication Sig Start Date End Date Taking? Authorizing Provider  Albuterol Sulfate (PROAIR HFA IN) Inhale 2 puffs into the lungs 4 (four) times daily as needed (wheezing).    Yes [provider]  diphenhydrAMINE (BENADRYL) 25 MG tablet Take 25 mg by mouth at bedtime.   Yes [provider]  fexofenadine (ALLEGRA) 180 MG tablet Take 180 mg by mouth daily as needed for allergies or rhinitis.   Yes [provider]  ibuprofen (ADVIL,MOTRIN) 200 MG tablet Take 400 mg by mouth every morning.     Yes [provider]  losartan-hydrochlorothiazide (HYZAAR) 50-12.5 MG tablet Take 1/2 tablet by mouth once daily 12/09/14  Yes [provider]  omeprazole (PRILOSEC OTC) 20 MG tablet Take 20 mg by mouth at bedtime.    Yes [provider]  pravastatin (PRAVACHOL) 40 MG tablet TAKE 1 TABLET(40 MG) BY MOUTH EVERY EVENING Patient taking differently: Take 40 mg by mouth every evening. 10/21/21  Yes Donato Heinz, MD  SYMBICORT 80-4.5 MCG/ACT inhaler Take 2 puffs by mouth 2 (two) times daily. 07/06/17  Yes [provider]    Physical Exam: Vitals:   10/25/21 0305 10/25/21 0310 10/25/21 0315 10/25/21 0330  BP: (!) 115/55  (!) 115/50 (!) 124/57  Pulse:  80 83 85  Resp:  '11 15 18  '$ Temp:      TempSrc:      SpO2:  94% 94% 99%   Constitutional: NAD, calm, comfortable Eyes: PERRL, lids and conjunctivae normal ENMT: Mucous membranes are moist. Posterior pharynx clear of any exudate or lesions.Normal dentition.  Neck: normal, supple, no masses, no thyromegaly Respiratory: clear to auscultation bilaterally, no wheezing, no crackles. Normal respiratory effort. No accessory muscle use.  Cardiovascular: Regular rate and rhythm, no murmurs / rubs / gallops. No extremity edema. 2+ pedal pulses. No carotid bruits.  Abdomen: no tenderness, no masses palpated. No hepatosplenomegaly. Bowel sounds positive.  Musculoskeletal: no clubbing / cyanosis. No joint deformity upper and lower extremities. Good ROM, no contractures. Normal muscle tone.  Skin: Pale Neurologic: CN 2-12 grossly intact. Sensation intact, DTR normal. Strength 5/5 in all 4.  Psychiatric: Normal judgment and insight. Alert and oriented x 3. Normal mood.   Data Reviewed:       Latest Ref Rng & Units 10/24/2021   10:31 PM 08/13/2017   11:50 AM 12/28/2014    9:50 AM  CBC  WBC 4.0 - 10.5 K/uL 6.8  5.9  5.0   Hemoglobin 12.0 - 15.0 g/dL 6.7  12.3  13.2   Hematocrit 36.0 - 46.0 % 21.8  37.0  39.5    Platelets 150 - 400 K/uL 337  364  224       Latest Ref Rng & Units 10/24/2021   10:31 PM 08/13/2017   11:50 AM 12/28/2014    9:50 AM  CMP  Glucose 70 - 99 mg/dL 104  119  122   BUN 8 - 23 mg/dL '20  16  21   '$ Creatinine 0.44 - 1.00 mg/dL 1.02  0.73  1.08   Sodium 135 - 145 mmol/L 134  137  138   Potassium 3.5 -  5.1 mmol/L 4.4  4.8  4.5   Chloride 98 - 111 mmol/L 101  104  102   CO2 22 - 32 mmol/L '24  23  27   '$ Calcium 8.9 - 10.3 mg/dL 9.6  9.8  9.7   Total Protein 6.5 - 8.1 g/dL 6.4  6.7    Total Bilirubin 0.3 - 1.2 mg/dL 0.6  1.4    Alkaline Phos 38 - 126 U/L 101  84    AST 15 - 41 U/L 22  19    ALT 0 - 44 U/L 34  15        Assessment and Plan: * GI bleed Hemoccult positive. Sounds like shes having intermittent melena, suspicious for UGIB. HPI is very suspicious for NSAID ulcer as cause. NPO Transfuse as below Tele monitor EDP sent message to [facility] for consult, presumably EGD. I also sent a message to Dr. Lorenso Courier to update with the NSAID history. Stop NSAIDs for the moment obviously. PPI bolus and gtt.  ABLA (acute blood loss anemia) Due to GIB 2u PRBC transfusion ordered Repeat CBC post transfusion.  Essential hypertension Hold Hyzaar for the moment.  Type 2 diabetes mellitus without complication, without long-term current use of insulin (HCC) Sensitive SSI Q4H for the moment.      Advance Care Planning:   Code Status: Full Code  Consults: [facility]  Family Communication: No family in room  Severity of Illness: The appropriate patient status for this patient is INPATIENT. Inpatient status is judged to be reasonable and necessary in order to provide the required intensity of service to ensure the patient's safety. The patient's presenting symptoms, physical exam findings, and initial radiographic and laboratory data in the context of their chronic comorbidities is felt to place them at high risk for further clinical deterioration. Furthermore, it is not anticipated  that the patient will be medically stable for discharge from the hospital within 2 midnights of admission.   * I certify that at the point of admission it is my clinical judgment that the patient will require inpatient hospital care spanning beyond 2 midnights from the point of admission due to high intensity of service, high risk for further deterioration and high frequency of surveillance required.*  Author: Etta Quill., DO 10/25/2021 4:59 AM  For on call review [email or web address].

## 2021-10-25 NOTE — Assessment & Plan Note (Signed)
Sensitive SSI Q4H for the moment. 

## 2021-10-25 NOTE — ED Notes (Signed)
Per Benjamine Mola, RN with endo, pt to go to procedure at 1300.

## 2021-10-25 NOTE — ED Notes (Signed)
Patient transported to CT 

## 2021-10-25 NOTE — Anesthesia Preprocedure Evaluation (Addendum)
Anesthesia Evaluation  Patient identified by MRN, date of birth, ID band Patient awake    Reviewed: Allergy & Precautions, NPO status , Patient's Chart, lab work & pertinent test results  Airway Mallampati: I  TM Distance: >3 FB Neck ROM: Full    Dental  (+) Teeth Intact, Dental Advisory Given   Pulmonary asthma , former smoker Rescue inhaler use 3-4x/daily Quit smoking 59moago     Pulmonary exam normal breath sounds clear to auscultation       Cardiovascular hypertension, Pt. on medications +CHF (normal LVEF, grade 1 diastolic dysfunction on last echo 2021)  Normal cardiovascular exam+ Valvular Problems/Murmurs (mod AS on 2021 echo) AS  Rhythm:Regular Rate:Normal  Last echo 2021 1. Left ventricular ejection fraction, by estimation, is 60 to 65%. The  left ventricle has normal function. The left ventricle has no regional  wall motion abnormalities. There is moderate left ventricular hypertrophy.  Left ventricular diastolic  parameters are consistent with Grade I diastolic dysfunction (impaired  relaxation).  2. Right ventricular systolic function is normal. The right ventricular  size is normal.  3. The mitral valve is normal in structure. No evidence of mitral valve  regurgitation. No evidence of mitral stenosis. Moderate mitral annular  calcification.  4. The aortic valve is calcified. There is moderate calcification of the  aortic valve. There is moderate thickening of the aortic valve. Aortic  valve regurgitation is not visualized. Moderate aortic valve stenosis.  Aortic valve area, by VTI measures  1.32 cm. Aortic valve mean gradient measures 29.3 mmHg. Aortic valve Vmax  measures 3.71 m/s.  5. The inferior vena cava is normal in size with greater than 50%  respiratory variability, suggesting right atrial pressure of 3 mmHg.    Neuro/Psych negative neurological ROS  negative psych ROS   GI/Hepatic Neg liver  ROS, hiatal hernia,GERD  Medicated and Controlled,,GIB thought to be 2/2 NSAID use    Endo/Other  diabetes, Well Controlled  a1c 6  Renal/GU negative Renal ROS  negative genitourinary   Musculoskeletal negative musculoskeletal ROS (+)    Abdominal   Peds  Hematology  (+) Blood dyscrasia, anemia Hb 8.2 this AM from 6.7 last night    Anesthesia Other Findings   Reproductive/Obstetrics negative OB ROS                             Anesthesia Physical Anesthesia Plan  ASA: 3  Anesthesia Plan: MAC   Post-op Pain Management:    Induction:   PONV Risk Score and Plan: 2 and Treatment may vary due to age or medical condition, Propofol infusion and TIVA  Airway Management Planned: Natural Airway and Simple Face Mask  Additional Equipment: None  Intra-op Plan:   Post-operative Plan:   Informed Consent: I have reviewed the patients History and Physical, chart, labs and discussed the procedure including the risks, benefits and alternatives for the proposed anesthesia with the patient or authorized representative who has indicated his/her understanding and acceptance.     Dental advisory given  Plan Discussed with: CRNA  Anesthesia Plan Comments:         Anesthesia Quick Evaluation

## 2021-10-25 NOTE — Interval H&P Note (Signed)
History and Physical Interval Note:  10/25/2021 12:58 PM  Mandy Delacruz  has presented today for surgery, with the diagnosis of dark hemoccult positive stool, anemia.  The various methods of treatment have been discussed with the patient and family. After consideration of risks, benefits and other options for treatment, the patient has consented to  Procedure(s): ESOPHAGOGASTRODUODENOSCOPY (EGD) WITH PROPOFOL (N/A) as a surgical intervention.  The patient's history has been reviewed, patient examined, no change in status, stable for surgery.  I have reviewed the patient's chart and labs.  Questions were answered to the patient's satisfaction.     Sharyn Creamer

## 2021-10-25 NOTE — ED Notes (Signed)
Pt IV infiltrated (left [facility]), pharmacy notified and IV has been removed. IV site is still clean, intact, and dry.

## 2021-10-25 NOTE — ED Notes (Signed)
Ambulates to the restroom at this time with a steady gait 

## 2021-10-25 NOTE — Consult Note (Signed)
Consultation Note   Referring Provider: [facility] PCP: [name], MD Primary Gastroenterologist: Previously Dr. Olevia Perches Reason for consultation: GI bleed  Hospital Day: 2  Assessment / Plan   # 73 you female with dark, heme+ stool and severe anemia with hgb of 6.7 ( 12 ranage in 2019). Melena started 2-3 weeks ago along with N/V and fever Symptoms resolved except for occasional dark stools. Takes NSAIDs daily. Rule out PUD Schedule for EGD. The risks and benefits of EGD with possible biopsies were discussed with the patient who agrees to proceed.  Hgb improved from 6.7 to 8.2 after a uPRBC, getting a 2nd unit now.  Continue PPI infusion Will obtain iron studies.   # Chronic GERD, asymptomatic on daily PPI  # Aortic stenosis  # See PMH for additional medical problems   HPI   Mandy Delacruz is a 76 y.o. female with a past medical history significant for aortic stenosis, asthma, diverticulosis, GERD, hiatal hernia, hyperlipidemia, hypertension, cholecystectomy, umbilical hernia repair see PMH for any additional medical problems.   Patient presented to ED yesterday for evaluation of anemia, her PCP sent her for low hgb. A couple of weeks ago she had an illness with symptoms consisting of dark loose stool, nausea, vomiting and fever. No hematemesis, emesis was bilious. Since then she has continued to have intermittent dark stools in absence of bismuth or iron . She has felt weak. She takes Advil on a daily basis ( for years). She takes daily Omeprazole.   ED Evaluation:  VSS BUN 20, creatinine 0.02 Albumin 2.8 WBC 6.4 Hemoglobin 6.7 (it was 12.3 in 2019) MCV 88 Platelets 307 FOBT+ CTA abdomen_severe stenosis of the IMA origin.  No stenosis of celiac or SMA, no gastrointestinal hemorrhage  Previous GI Evaluation    2014 EGD --1 cm HH  2014 colonoscopy  --Complete exam, good prep - moderate diverticulosis, diminutive  polyps Path - polypoid mucosa, no adenomatous change   Recent Labs and Imaging CT ANGIO GI BLEED  Result Date: 10/25/2021 CLINICAL DATA:  Lower GI bleed EXAM: CTA ABDOMEN AND PELVIS WITHOUT AND WITH CONTRAST TECHNIQUE: Multidetector CT imaging of the abdomen and pelvis was performed using the standard protocol during bolus administration of intravenous contrast. Multiplanar reconstructed images and MIPs were obtained and reviewed to evaluate the vascular anatomy. RADIATION DOSE REDUCTION: This exam was performed according to the departmental dose-optimization program which includes automated exposure control, adjustment of the mA and/or kV according to patient size and/or use of iterative reconstruction technique. CONTRAST:  149m OMNIPAQUE IOHEXOL 350 MG/ML SOLN COMPARISON:  None Available. FINDINGS: VASCULAR Aorta: Aortic atherosclerosis.  No dissection or aneurysm. Celiac: Patent without evidence of aneurysm, dissection, vasculitis or significant stenosis. SMA: Patent without evidence of aneurysm, dissection, vasculitis or significant stenosis. Renals: Both renal arteries are patent without evidence of aneurysm, dissection, vasculitis, fibromuscular dysplasia or significant stenosis. IMA: Severe stenosis at the IMA origin, but otherwise normal. Inflow: Patent without evidence of aneurysm, dissection, vasculitis or significant stenosis. Proximal Outflow: Bilateral common femoral and visualized portions of the superficial and profunda femoral arteries are patent without evidence of aneurysm, dissection, vasculitis or significant stenosis. Veins: No obvious venous abnormality within the limitations of this arterial phase study. Review of  the MIP images confirms the above findings. NON-VASCULAR Lower chest: No acute abnormality. Hepatobiliary: No focal liver abnormality is seen. Status post cholecystectomy. No biliary dilatation. Enhancing focus in the left hepatic lobe is likely a hemangioma. Pancreas:  Unremarkable. No pancreatic ductal dilatation or surrounding inflammatory changes. Spleen: Normal in size without focal abnormality. Adrenals/Urinary Tract: There is mild inhomogeneity of the parenchymal contrast enhancement pattern of the right kidney. Stomach/Bowel: No gastrointestinal hemorrhage identified. No small bowel obstruction or focal colonic abnormality. Lymphatic: No lymphadenopathy Reproductive: Uterus and bilateral adnexa are unremarkable. Other: Negative Musculoskeletal: Negative IMPRESSION: 1. No gastrointestinal hemorrhage identified. 2. Mild inhomogeneity of the parenchymal contrast enhancement pattern of the right kidney. Correlate with urinalysis to exclude pyelonephritis. Aortic Atherosclerosis (ICD10-I70.0). Electronically Signed   By: Ulyses Jarred M.D.   On: 10/25/2021 03:29   DG Chest 2 View  Result Date: 10/14/2021 CLINICAL DATA:  Fever EXAM: CHEST - 2 VIEW.  Patient is rotated on frontal view. COMPARISON:  Chest x-ray 06/01/2017, CT chest 02/19/2021 FINDINGS: The heart and mediastinal contours are within normal limits. Aortic calcification. Prominent right hilar region likely due to patient rotation. No focal consolidation. No pulmonary edema. No pleural effusion. No pneumothorax. No acute osseous abnormality. IMPRESSION: 1. No active cardiopulmonary disease. 2.  Aortic Atherosclerosis (ICD10-I70.0). Electronically Signed   By: Iven Finn M.D.   On: 10/14/2021 17:11    Labs:  Recent Labs    10/24/21 2231 10/25/21 0650  WBC 6.8 6.4  HGB 6.7* 8.2*  HCT 21.8* 25.4*  PLT 337 307   Recent Labs    10/24/21 2231 10/25/21 0650  NA 134* 134*  K 4.4 4.5  CL 101 103  CO2 24 22  GLUCOSE 104* 127*  BUN 20 17  CREATININE 1.02* 0.98  CALCIUM 9.6 9.3   Recent Labs    10/24/21 2231  PROT 6.4*  ALBUMIN 2.8*  AST 22  ALT 34  ALKPHOS 101  BILITOT 0.6   No results for input(s): "HEPBSAG", "HCVAB", "HEPAIGM", "HEPBIGM" in the last 72 hours. No results for input(s):  "LABPROT", "INR" in the last 72 hours.  Past Medical History:  Diagnosis Date   Aortic stenosis    Asthma    Bronchitis    on bactrin since 12-27-14   Colon polyps 2001   Diverticulosis of colon (without mention of hemorrhage) 2001   Dizziness    inner ear problem at times, saw dr  [name] for   GERD (gastroesophageal reflux disease)    Heart murmur    Hiatal hernia 2001   Hyperlipidemia    Hypertension    IH (inguinal hernia)    left   Lung nodule    Other esophagitis 2001   Tobacco abuse     Past Surgical History:  Procedure Laterality Date   breast biospy     left, benign   CHOLECYSTECTOMY     COLONOSCOPY     ENDOVENOUS ABLATION SAPHENOUS VEIN W/ LASER Left 04/29/2016   endovenous laser ablation L SSV and stab phlebectomy left leg by Tinnie Gens MD    [place]  oct 2015   left   INGUINAL HERNIA REPAIR Left 01/10/2015   Procedure: OPEN REPAIR OF RECURRENT LEFT INGUINAL HERNIA ;  Surgeon: Greer Pickerel, MD;  Location: [facility];  Service: General;  Laterality: Left;   INSERTION OF MESH Left 01/10/2015   Procedure: INSERTION OF MESH;  Surgeon: Greer Pickerel, MD;  Location: [facility];  Service: General;  Laterality: Left;  INSERTION OF MESH N/A 08/18/2017   Procedure: INSERTION OF MESH;  Surgeon: Greer Pickerel, MD;  Location: [place];  Service: General;  Laterality: N/A;   LAPAROSCOPY N/A 01/10/2015   Procedure: LAPAROSCOPY DIAGNOSTIC;  Surgeon: Greer Pickerel, MD;  Location: [facility];  Service: General;  Laterality: N/A;   ORIF HIP FRACTURE  2006   3 pins, right   UMBILICAL HERNIA REPAIR N/A 08/18/2017   Procedure: LAPAROSCOPIC-ASSISTED UMBILICAL HERNIA  REPAIR ERAS PATHWAY;  Surgeon: Greer Pickerel, MD;  Location: [facility];  Service: General;  Laterality: N/A;    Family History  Problem Relation Age of Onset   COPD Mother    Hypertension Father    Celiac disease Other        neice, Sister's daughter   Colon cancer Neg Hx    Esophageal cancer Neg Hx    Rectal  cancer Neg Hx    Stomach cancer Neg Hx     Prior to Admission medications   Medication Sig Start Date End Date Taking? Authorizing Provider  Albuterol Sulfate (PROAIR HFA IN) Inhale 2 puffs into the lungs 4 (four) times daily as needed (wheezing).    Yes [provider]  diphenhydrAMINE (BENADRYL) 25 MG tablet Take 25 mg by mouth at bedtime.   Yes [provider]  fexofenadine (ALLEGRA) 180 MG tablet Take 180 mg by mouth daily as needed for allergies or rhinitis.   Yes [provider]  ibuprofen (ADVIL,MOTRIN) 200 MG tablet Take 400 mg by mouth every morning.    Yes [provider]  losartan-hydrochlorothiazide (HYZAAR) 50-12.5 MG tablet Take 1/2 tablet by mouth once daily 12/09/14  Yes [provider]  omeprazole (PRILOSEC OTC) 20 MG tablet Take 20 mg by mouth at bedtime.    Yes [provider]  pravastatin (PRAVACHOL) 40 MG tablet TAKE 1 TABLET(40 MG) BY MOUTH EVERY EVENING Patient taking differently: Take 40 mg by mouth every evening. 10/21/21  Yes Donato Heinz, MD  SYMBICORT 80-4.5 MCG/ACT inhaler Take 2 puffs by mouth 2 (two) times daily. 07/06/17  Yes [provider]    Current Facility-Administered Medications  Medication Dose Route Frequency Provider Last Rate Last Admin   acetaminophen (TYLENOL) tablet 650 mg  650 mg Oral Q6H PRN Etta Quill, DO       Or   acetaminophen (TYLENOL) suppository 650 mg  650 mg Rectal Q6H PRN Etta Quill, DO       albuterol (PROVENTIL) (2.5 MG/3ML) 0.083% nebulizer solution 2.5 mg  2.5 mg Inhalation QID PRN Etta Quill, DO       insulin aspart (novoLOG) injection 0-9 Units  0-9 Units Subcutaneous Q4H Etta Quill, DO   1 Units at 10/25/21 0650   loratadine (CLARITIN) tablet 10 mg  10 mg Oral Daily PRN Etta Quill, DO       mometasone-formoterol (DULERA) 100-5 MCG/ACT inhaler 2 puff  2 puff Inhalation BID Etta Quill, DO       ondansetron [facility]) tablet  4 mg  4 mg Oral Q6H PRN Etta Quill, DO       Or   ondansetron [facility]) injection 4 mg  4 mg Intravenous Q6H PRN Etta Quill, DO       pantoprazole (PROTONIX) 80 mg /NS 100 mL IVPB  80 mg Intravenous Once Etta Quill, DO       [START ON 10/28/2021] pantoprazole (PROTONIX) injection 40 mg  40 mg Intravenous Q12H Etta Quill, DO  pantoprozole (PROTONIX) 80 mg /NS 100 mL infusion  8 mg/hr Intravenous Continuous Alcario Drought, Jared M, DO       pravastatin (PRAVACHOL) tablet 40 mg  40 mg Oral QPM Etta Quill, DO       Current Outpatient Medications  Medication Sig Dispense Refill   Albuterol Sulfate (PROAIR HFA IN) Inhale 2 puffs into the lungs 4 (four) times daily as needed (wheezing).      diphenhydrAMINE (BENADRYL) 25 MG tablet Take 25 mg by mouth at bedtime.     fexofenadine (ALLEGRA) 180 MG tablet Take 180 mg by mouth daily as needed for allergies or rhinitis.     ibuprofen (ADVIL,MOTRIN) 200 MG tablet Take 400 mg by mouth every morning.      losartan-hydrochlorothiazide (HYZAAR) 50-12.5 MG tablet Take 1/2 tablet by mouth once daily  0   omeprazole (PRILOSEC OTC) 20 MG tablet Take 20 mg by mouth at bedtime.      pravastatin (PRAVACHOL) 40 MG tablet TAKE 1 TABLET(40 MG) BY MOUTH EVERY EVENING (Patient taking differently: Take 40 mg by mouth every evening.) 90 tablet 0   SYMBICORT 80-4.5 MCG/ACT inhaler Take 2 puffs by mouth 2 (two) times daily.  11    Allergies as of 10/24/2021 - Review Complete 02/19/2021  Allergen Reaction Noted   Doxycycline hyclate Rash 08/10/2017   Ace inhibitors  06/25/2020   Hydrocodone bit-homatrop mbr Other (See Comments) 06/25/2020   Codeine Nausea Only 03/18/2012    Social History   Socioeconomic History   Marital status: Married    Spouse name: Not on file   Number of children: 2   Years of education: Not on file   Highest education level: Not on file  Occupational History   Occupation: part-time office asst.  Tobacco Use    Smoking status: Former    Years: 45.00    Types: Cigarettes   Smokeless tobacco: Never   Tobacco comments:    maybe 5-10 cigarettes a day  starts and stops smoking every few days  Vaping Use   Vaping Use: Never used  Substance and Sexual Activity   Alcohol use: Yes    Alcohol/week: 0.0 - 1.0 standard drinks of alcohol    Comment: social   Drug use: No   Sexual activity: Not on file  Other Topics Concern   Not on file  Social History Narrative   Not on file   Social Determinants of Health   Financial Resource Strain: Not on file  Food Insecurity: Not on file  Transportation Needs: Not on file  Physical Activity: Not on file  Stress: Not on file  Social Connections: Not on file  Intimate Partner Violence: Not on file    Review of Systems: All systems reviewed and negative except where noted in HPI.  Physical Exam: Vital signs in last 24 hours: Temp:  [97.5 F (36.4 C)-98.5 F (36.9 C)] 98.1 F (36.7 C) (10/13 0738) Pulse Rate:  [72-87] 75 (10/13 0738) Resp:  [11-24] 15 (10/13 0738) BP: (108-124)/(50-66) 120/57 (10/13 0738) SpO2:  [94 %-100 %] 96 % (10/13 3419)    General:  Alert female in NAD Psych:  Pleasant, cooperative. Normal mood and affect Eyes: Pupils equal Ears:  Normal auditory acuity Nose: No deformity, discharge or lesions Neck:  Supple, no masses felt Lungs:  Coarse breath sounds bilaterally. Marland Kitchen  Heart:  Regular rate, regular rhythm, + loud murmur. No lower extremity edema Abdomen:  Soft, nondistended, nontender, active bowel sounds, no masses felt Rectal :  Deferred Msk: Symmetrical without gross deformities.  Neurologic:  Alert, oriented, grossly normal neurologically Skin:  Intact without significant lesions.    Intake/Output from previous day: 10/12 0701 - 10/13 0700 In: 300 [Blood:300] Out: -  Intake/Output this shift:  Total I/O In: 315 [Blood:315] Out: -     Principal Problem:   GI bleed Active Problems:   Type 2 diabetes  mellitus without complication, without long-term current use of insulin (HCC)   Essential hypertension   ABLA (acute blood loss anemia)    Tye Savoy, NP-C @  10/25/2021, 9:06 AM

## 2021-10-25 NOTE — Progress Notes (Signed)
[facility] PROGRESS NOTE   Mandy Delacruz ION:[id number] DOB: 1945-01-20 DOA: 10/24/2021  PCP: [name], MD  Brief History/Interval Summary: 76 year old female with a past medical history of essential hypertension, hyperlipidemia presented with intermittent dark stools with diarrhea starting about 2 weeks ago.  Also had nausea and vomiting about a week prior to admission.  Went to her primary care physician's office and was found to have a hemoglobin of 7.2.  She was sent to the emergency department.  She admits to taking ibuprofen on a daily basis for arthritis pain.  Consultants: Gastroenterology  Procedures: EGD is planned for today    Subjective/Interval History: Patient denies any abdominal pain nausea this morning.  No shortness of breath.  No chest pain.    Assessment/Plan:  GI bleed, likely upper Presented with history suggestive of melanotic stools.  Has been taking ibuprofen on a daily basis.  She has had endoscopies previously with the last one was about 9 years ago. Gastroenterology has been consulted.  Plan is for upper endoscopy today.  She is on Protonix infusion. CT angiogram of the abdomen did not show any acute bleeding.  Acute blood loss anemia Noted to have a hemoglobin of 6.7.  Blood transfusions have been ordered.  Monitor hemoglobin closely.  Abnormal UA with urinary symptoms and back pain CT angiogram did raise concern for pyelonephritis.  We will place her on ceftriaxone.  Follow-up on urine cultures.  Essential hypertension Holding antihypertensives.  Monitor blood pressures closely.  Diabetes mellitus type 2 without complications KGM0N 6.0.  Monitor CBGs.   DVT Prophylaxis: SCDs Code Status: Full code Family Communication: Discussed with the patient Disposition Plan: Hopefully return home in improved  Status is: Inpatient Remains inpatient appropriate because: Upper GI bleed      Medications: Scheduled:  insulin aspart   0-9 Units Subcutaneous Q4H   mometasone-formoterol  2 puff Inhalation BID   [START ON 10/28/2021] pantoprazole  40 mg Intravenous Q12H   pravastatin  40 mg Oral QPM   Continuous:  [START ON 10/26/2021] cefTRIAXone (ROCEPHIN)  IV     pantoprazole 80 mg (10/25/21 0947)   pantoprazole 8 mg/hr (10/25/21 0946)   UUV:[id number] **OR** acetaminophen, albuterol, loratadine, ondansetron **OR** ondansetron (ZOFRAN) IV  Antibiotics: Anti-infectives (From admission, onward)    Start     Dose/Rate Route Frequency Ordered Stop   10/26/21 1000  cefTRIAXone (ROCEPHIN) 1 g in sodium chloride 0.9 % 100 mL IVPB        1 g 200 mL/hr over 30 Minutes Intravenous Every 24 hours 10/25/21 0947 10/30/21 0959   10/25/21 0330  cefTRIAXone (ROCEPHIN) 2 g in sodium chloride 0.9 % 100 mL IVPB        2 g 200 mL/hr over 30 Minutes Intravenous  Once 10/25/21 0315 10/25/21 0355       Objective:  Vital Signs  Vitals:   10/25/21 0721 10/25/21 0738 10/25/21 0930 10/25/21 0945  BP:  (!) 120/57 (!) 143/79 134/63  Pulse:  75  71  Resp:  '15 17 14  '$ Temp: (!) 97.5 F (36.4 C) 98.1 F (36.7 C)    TempSrc:  Oral    SpO2:  96%  93%    Intake/Output Summary (Last 24 hours) at 10/25/2021 0959 Last data filed at 10/25/2021 5956 Gross per 24 hour  Intake 615 ml  Output --  Net 615 ml   There were no vitals filed for this visit.  General appearance: Awake alert.  In no distress Resp: Clear  to auscultation bilaterally.  Normal effort Cardio: S1-S2 is normal regular.  No S3-S4.  No rubs murmurs or bruit GI: Abdomen is soft.  Nontender nondistended.  Bowel sounds are present normal.  No masses organomegaly Extremities: No edema.  Full range of motion of lower extremities. Neurologic: Alert and oriented x3.  No focal neurological deficits.    Lab Results:  Data Reviewed: I have personally reviewed following labs and reports of the imaging studies  CBC: Recent Labs  Lab 10/24/21 2231 10/25/21 0650  WBC  6.8 6.4  NEUTROABS 4.2  --   HGB 6.7* 8.2*  HCT 21.8* 25.4*  MCV 90.1 88.8  PLT 337 706    Basic Metabolic Panel: Recent Labs  Lab 10/24/21 2231 10/25/21 0650  NA 134* 134*  K 4.4 4.5  CL 101 103  CO2 24 22  GLUCOSE 104* 127*  BUN 20 17  CREATININE 1.02* 0.98  CALCIUM 9.6 9.3    GFR: CrCl cannot be calculated (Unknown ideal weight.).  Liver Function Tests: Recent Labs  Lab 10/24/21 2231  AST 22  ALT 34  ALKPHOS 101  BILITOT 0.6  PROT 6.4*  ALBUMIN 2.8*     HbA1C: Recent Labs    10/25/21 0650  HGBA1C 6.0*    CBG: Recent Labs  Lab 10/25/21 0457 10/25/21 0803  GLUCAP 135* 120*      Recent Results (from the past 240 hour(s))  Resp Panel by RT-PCR (Flu A&B, Covid) Anterior Nasal Swab     Status: None   Collection Time: 10/24/21 10:31 PM   Specimen: Anterior Nasal Swab  Result Value Ref Range Status   SARS Coronavirus 2 by RT PCR NEGATIVE NEGATIVE Final    Comment: (NOTE) SARS-CoV-2 target nucleic acids are NOT DETECTED.  The SARS-CoV-2 RNA is generally detectable in upper respiratory specimens during the acute phase of infection. The lowest concentration of SARS-CoV-2 viral copies this assay can detect is 138 copies/mL. A negative result does not preclude SARS-Cov-2 infection and should not be used as the sole basis for treatment or other patient management decisions. A negative result may occur with  improper specimen collection/handling, submission of specimen other than nasopharyngeal swab, presence of viral mutation(s) within the areas targeted by this assay, and inadequate number of viral copies(<138 copies/mL). A negative result must be combined with clinical observations, patient history, and epidemiological information. The expected result is Negative.  Fact Sheet for Patients:  EntrepreneurPulse.com.au  Fact Sheet for Healthcare Providers:  IncredibleEmployment.be  This test is no t yet approved  or cleared by the [place] FDA and  has been authorized for detection and/or diagnosis of SARS-CoV-2 by FDA under an Emergency Use Authorization (EUA). This EUA will remain  in effect (meaning this test can be used) for the duration of the COVID-19 declaration under Section 564(b)(1) of the Act, 21 U.S.C.section 360bbb-3(b)(1), unless the authorization is terminated  or revoked sooner.       Influenza A by PCR NEGATIVE NEGATIVE Final   Influenza B by PCR NEGATIVE NEGATIVE Final    Comment: (NOTE) The Xpert Xpress SARS-CoV-2/FLU/RSV plus assay is intended as an aid in the diagnosis of influenza from Nasopharyngeal swab specimens and should not be used as a sole basis for treatment. Nasal washings and aspirates are unacceptable for Xpert Xpress SARS-CoV-2/FLU/RSV testing.  Fact Sheet for Patients: EntrepreneurPulse.com.au  Fact Sheet for Healthcare Providers: IncredibleEmployment.be  This test is not yet approved or cleared by the [place] and has been authorized for  detection and/or diagnosis of SARS-CoV-2 by FDA under an Emergency Use Authorization (EUA). This EUA will remain in effect (meaning this test can be used) for the duration of the COVID-19 declaration under Section 564(b)(1) of the Act, 21 U.S.C. section 360bbb-3(b)(1), unless the authorization is terminated or revoked.  Performed at [facility], [place] [address] [place], [place] [address]       Radiology Studies: CT ANGIO GI BLEED  Result Date: 10/25/2021 CLINICAL DATA:  Lower GI bleed EXAM: CTA ABDOMEN AND PELVIS WITHOUT AND WITH CONTRAST TECHNIQUE: Multidetector CT imaging of the abdomen and pelvis was performed using the standard protocol during bolus administration of intravenous contrast. Multiplanar reconstructed images and MIPs were obtained and reviewed to evaluate the vascular anatomy. RADIATION DOSE REDUCTION: This exam was performed according to  the departmental dose-optimization program which includes automated exposure control, adjustment of the mA and/or kV according to patient size and/or use of iterative reconstruction technique. CONTRAST:  138m OMNIPAQUE IOHEXOL 350 MG/ML SOLN COMPARISON:  None Available. FINDINGS: VASCULAR Aorta: Aortic atherosclerosis.  No dissection or aneurysm. Celiac: Patent without evidence of aneurysm, dissection, vasculitis or significant stenosis. SMA: Patent without evidence of aneurysm, dissection, vasculitis or significant stenosis. Renals: Both renal arteries are patent without evidence of aneurysm, dissection, vasculitis, fibromuscular dysplasia or significant stenosis. IMA: Severe stenosis at the IMA origin, but otherwise normal. Inflow: Patent without evidence of aneurysm, dissection, vasculitis or significant stenosis. Proximal Outflow: Bilateral common femoral and visualized portions of the superficial and profunda femoral arteries are patent without evidence of aneurysm, dissection, vasculitis or significant stenosis. Veins: No obvious venous abnormality within the limitations of this arterial phase study. Review of the MIP images confirms the above findings. NON-VASCULAR Lower chest: No acute abnormality. Hepatobiliary: No focal liver abnormality is seen. Status post cholecystectomy. No biliary dilatation. Enhancing focus in the left hepatic lobe is likely a hemangioma. Pancreas: Unremarkable. No pancreatic ductal dilatation or surrounding inflammatory changes. Spleen: Normal in size without focal abnormality. Adrenals/Urinary Tract: There is mild inhomogeneity of the parenchymal contrast enhancement pattern of the right kidney. Stomach/Bowel: No gastrointestinal hemorrhage identified. No small bowel obstruction or focal colonic abnormality. Lymphatic: No lymphadenopathy Reproductive: Uterus and bilateral adnexa are unremarkable. Other: Negative Musculoskeletal: Negative IMPRESSION: 1. No gastrointestinal  hemorrhage identified. 2. Mild inhomogeneity of the parenchymal contrast enhancement pattern of the right kidney. Correlate with urinalysis to exclude pyelonephritis. Aortic Atherosclerosis (ICD10-I70.0). Electronically Signed   By: KUlyses JarredM.D.   On: 10/25/2021 03:29       LOS: 0 days   [facility] Pager on [email or web address]  10/25/2021, 9:59 AM

## 2021-10-25 NOTE — Assessment & Plan Note (Addendum)
Hold Hyzaar for the moment.

## 2021-10-25 NOTE — ED Notes (Signed)
Pt transported to endo.  

## 2021-10-25 NOTE — H&P (View-Only) (Signed)
Consultation Note   Referring Provider: [facility] PCP: [name], MD Primary Gastroenterologist: Previously Dr. Olevia Perches Reason for consultation: GI bleed  Hospital Day: 2  Assessment / Plan   # 16 you female with dark, heme+ stool and severe anemia with hgb of 6.7 ( 12 ranage in 2019). Melena started 2-3 weeks ago along with N/V and fever Symptoms resolved except for occasional dark stools. Takes NSAIDs daily. Rule out PUD Schedule for EGD. The risks and benefits of EGD with possible biopsies were discussed with the patient who agrees to proceed.  Hgb improved from 6.7 to 8.2 after a uPRBC, getting a 2nd unit now.  Continue PPI infusion Will obtain iron studies.   # Chronic GERD, asymptomatic on daily PPI  # Aortic stenosis  # See PMH for additional medical problems   HPI   Mandy Delacruz is a 76 y.o. female with a past medical history significant for aortic stenosis, asthma, diverticulosis, GERD, hiatal hernia, hyperlipidemia, hypertension, cholecystectomy, umbilical hernia repair see PMH for any additional medical problems.   Patient presented to ED yesterday for evaluation of anemia, her PCP sent her for low hgb. A couple of weeks ago she had an illness with symptoms consisting of dark loose stool, nausea, vomiting and fever. No hematemesis, emesis was bilious. Since then she has continued to have intermittent dark stools in absence of bismuth or iron . She has felt weak. She takes Advil on a daily basis ( for years). She takes daily Omeprazole.   ED Evaluation:  VSS BUN 20, creatinine 0.02 Albumin 2.8 WBC 6.4 Hemoglobin 6.7 (it was 12.3 in 2019) MCV 88 Platelets 307 FOBT+ CTA abdomen_severe stenosis of the IMA origin.  No stenosis of celiac or SMA, no gastrointestinal hemorrhage  Previous GI Evaluation    2014 EGD --1 cm HH  2014 colonoscopy  --Complete exam, good prep - moderate diverticulosis, diminutive  polyps Path - polypoid mucosa, no adenomatous change   Recent Labs and Imaging CT ANGIO GI BLEED  Result Date: 10/25/2021 CLINICAL DATA:  Lower GI bleed EXAM: CTA ABDOMEN AND PELVIS WITHOUT AND WITH CONTRAST TECHNIQUE: Multidetector CT imaging of the abdomen and pelvis was performed using the standard protocol during bolus administration of intravenous contrast. Multiplanar reconstructed images and MIPs were obtained and reviewed to evaluate the vascular anatomy. RADIATION DOSE REDUCTION: This exam was performed according to the departmental dose-optimization program which includes automated exposure control, adjustment of the mA and/or kV according to patient size and/or use of iterative reconstruction technique. CONTRAST:  132m OMNIPAQUE IOHEXOL 350 MG/ML SOLN COMPARISON:  None Available. FINDINGS: VASCULAR Aorta: Aortic atherosclerosis.  No dissection or aneurysm. Celiac: Patent without evidence of aneurysm, dissection, vasculitis or significant stenosis. SMA: Patent without evidence of aneurysm, dissection, vasculitis or significant stenosis. Renals: Both renal arteries are patent without evidence of aneurysm, dissection, vasculitis, fibromuscular dysplasia or significant stenosis. IMA: Severe stenosis at the IMA origin, but otherwise normal. Inflow: Patent without evidence of aneurysm, dissection, vasculitis or significant stenosis. Proximal Outflow: Bilateral common femoral and visualized portions of the superficial and profunda femoral arteries are patent without evidence of aneurysm, dissection, vasculitis or significant stenosis. Veins: No obvious venous abnormality within the limitations of this arterial phase study. Review of  the MIP images confirms the above findings. NON-VASCULAR Lower chest: No acute abnormality. Hepatobiliary: No focal liver abnormality is seen. Status post cholecystectomy. No biliary dilatation. Enhancing focus in the left hepatic lobe is likely a hemangioma. Pancreas:  Unremarkable. No pancreatic ductal dilatation or surrounding inflammatory changes. Spleen: Normal in size without focal abnormality. Adrenals/Urinary Tract: There is mild inhomogeneity of the parenchymal contrast enhancement pattern of the right kidney. Stomach/Bowel: No gastrointestinal hemorrhage identified. No small bowel obstruction or focal colonic abnormality. Lymphatic: No lymphadenopathy Reproductive: Uterus and bilateral adnexa are unremarkable. Other: Negative Musculoskeletal: Negative IMPRESSION: 1. No gastrointestinal hemorrhage identified. 2. Mild inhomogeneity of the parenchymal contrast enhancement pattern of the right kidney. Correlate with urinalysis to exclude pyelonephritis. Aortic Atherosclerosis (ICD10-I70.0). Electronically Signed   By: Ulyses Jarred M.D.   On: 10/25/2021 03:29   DG Chest 2 View  Result Date: 10/14/2021 CLINICAL DATA:  Fever EXAM: CHEST - 2 VIEW.  Patient is rotated on frontal view. COMPARISON:  Chest x-ray 06/01/2017, CT chest 02/19/2021 FINDINGS: The heart and mediastinal contours are within normal limits. Aortic calcification. Prominent right hilar region likely due to patient rotation. No focal consolidation. No pulmonary edema. No pleural effusion. No pneumothorax. No acute osseous abnormality. IMPRESSION: 1. No active cardiopulmonary disease. 2.  Aortic Atherosclerosis (ICD10-I70.0). Electronically Signed   By: Iven Finn M.D.   On: 10/14/2021 17:11    Labs:  Recent Labs    10/24/21 2231 10/25/21 0650  WBC 6.8 6.4  HGB 6.7* 8.2*  HCT 21.8* 25.4*  PLT 337 307   Recent Labs    10/24/21 2231 10/25/21 0650  NA 134* 134*  K 4.4 4.5  CL 101 103  CO2 24 22  GLUCOSE 104* 127*  BUN 20 17  CREATININE 1.02* 0.98  CALCIUM 9.6 9.3   Recent Labs    10/24/21 2231  PROT 6.4*  ALBUMIN 2.8*  AST 22  ALT 34  ALKPHOS 101  BILITOT 0.6   No results for input(s): "HEPBSAG", "HCVAB", "HEPAIGM", "HEPBIGM" in the last 72 hours. No results for input(s):  "LABPROT", "INR" in the last 72 hours.  Past Medical History:  Diagnosis Date   Aortic stenosis    Asthma    Bronchitis    on bactrin since 12-27-14   Colon polyps 2001   Diverticulosis of colon (without mention of hemorrhage) 2001   Dizziness    inner ear problem at times, saw dr  [name] for   GERD (gastroesophageal reflux disease)    Heart murmur    Hiatal hernia 2001   Hyperlipidemia    Hypertension    IH (inguinal hernia)    left   Lung nodule    Other esophagitis 2001   Tobacco abuse     Past Surgical History:  Procedure Laterality Date   breast biospy     left, benign   CHOLECYSTECTOMY     COLONOSCOPY     ENDOVENOUS ABLATION SAPHENOUS VEIN W/ LASER Left 04/29/2016   endovenous laser ablation L SSV and stab phlebectomy left leg by Tinnie Gens MD    [place]  oct 2015   left   INGUINAL HERNIA REPAIR Left 01/10/2015   Procedure: OPEN REPAIR OF RECURRENT LEFT INGUINAL HERNIA ;  Surgeon: Greer Pickerel, MD;  Location: [facility];  Service: General;  Laterality: Left;   INSERTION OF MESH Left 01/10/2015   Procedure: INSERTION OF MESH;  Surgeon: Greer Pickerel, MD;  Location: [facility];  Service: General;  Laterality: Left;  INSERTION OF MESH N/A 08/18/2017   Procedure: INSERTION OF MESH;  Surgeon: Greer Pickerel, MD;  Location: [place];  Service: General;  Laterality: N/A;   LAPAROSCOPY N/A 01/10/2015   Procedure: LAPAROSCOPY DIAGNOSTIC;  Surgeon: Greer Pickerel, MD;  Location: [facility];  Service: General;  Laterality: N/A;   ORIF HIP FRACTURE  2006   3 pins, right   UMBILICAL HERNIA REPAIR N/A 08/18/2017   Procedure: LAPAROSCOPIC-ASSISTED UMBILICAL HERNIA  REPAIR ERAS PATHWAY;  Surgeon: Greer Pickerel, MD;  Location: [place];  Service: General;  Laterality: N/A;    Family History  Problem Relation Age of Onset   COPD Mother    Hypertension Father    Celiac disease Other        neice, Sister's daughter   Colon cancer Neg Hx    Esophageal cancer Neg Hx    Rectal  cancer Neg Hx    Stomach cancer Neg Hx     Prior to Admission medications   Medication Sig Start Date End Date Taking? Authorizing Provider  Albuterol Sulfate (PROAIR HFA IN) Inhale 2 puffs into the lungs 4 (four) times daily as needed (wheezing).    Yes [provider]  diphenhydrAMINE (BENADRYL) 25 MG tablet Take 25 mg by mouth at bedtime.   Yes [provider]  fexofenadine (ALLEGRA) 180 MG tablet Take 180 mg by mouth daily as needed for allergies or rhinitis.   Yes [provider]  ibuprofen (ADVIL,MOTRIN) 200 MG tablet Take 400 mg by mouth every morning.    Yes [provider]  losartan-hydrochlorothiazide (HYZAAR) 50-12.5 MG tablet Take 1/2 tablet by mouth once daily 12/09/14  Yes [provider]  omeprazole (PRILOSEC OTC) 20 MG tablet Take 20 mg by mouth at bedtime.    Yes [provider]  pravastatin (PRAVACHOL) 40 MG tablet TAKE 1 TABLET(40 MG) BY MOUTH EVERY EVENING Patient taking differently: Take 40 mg by mouth every evening. 10/21/21  Yes Donato Heinz, MD  SYMBICORT 80-4.5 MCG/ACT inhaler Take 2 puffs by mouth 2 (two) times daily. 07/06/17  Yes [provider]    Current Facility-Administered Medications  Medication Dose Route Frequency Provider Last Rate Last Admin   acetaminophen (TYLENOL) tablet 650 mg  650 mg Oral Q6H PRN Etta Quill, DO       Or   acetaminophen (TYLENOL) suppository 650 mg  650 mg Rectal Q6H PRN Etta Quill, DO       albuterol (PROVENTIL) (2.5 MG/3ML) 0.083% nebulizer solution 2.5 mg  2.5 mg Inhalation QID PRN Etta Quill, DO       insulin aspart (novoLOG) injection 0-9 Units  0-9 Units Subcutaneous Q4H Etta Quill, DO   1 Units at 10/25/21 0650   loratadine (CLARITIN) tablet 10 mg  10 mg Oral Daily PRN Etta Quill, DO       mometasone-formoterol (DULERA) 100-5 MCG/ACT inhaler 2 puff  2 puff Inhalation BID Etta Quill, DO       ondansetron [facility]) tablet  4 mg  4 mg Oral Q6H PRN Etta Quill, DO       Or   ondansetron [facility]) injection 4 mg  4 mg Intravenous Q6H PRN Etta Quill, DO       pantoprazole (PROTONIX) 80 mg /NS 100 mL IVPB  80 mg Intravenous Once Etta Quill, DO       [START ON 10/28/2021] pantoprazole (PROTONIX) injection 40 mg  40 mg Intravenous Q12H Etta Quill, DO  pantoprozole (PROTONIX) 80 mg /NS 100 mL infusion  8 mg/hr Intravenous Continuous Alcario Drought, Jared M, DO       pravastatin (PRAVACHOL) tablet 40 mg  40 mg Oral QPM Etta Quill, DO       Current Outpatient Medications  Medication Sig Dispense Refill   Albuterol Sulfate (PROAIR HFA IN) Inhale 2 puffs into the lungs 4 (four) times daily as needed (wheezing).      diphenhydrAMINE (BENADRYL) 25 MG tablet Take 25 mg by mouth at bedtime.     fexofenadine (ALLEGRA) 180 MG tablet Take 180 mg by mouth daily as needed for allergies or rhinitis.     ibuprofen (ADVIL,MOTRIN) 200 MG tablet Take 400 mg by mouth every morning.      losartan-hydrochlorothiazide (HYZAAR) 50-12.5 MG tablet Take 1/2 tablet by mouth once daily  0   omeprazole (PRILOSEC OTC) 20 MG tablet Take 20 mg by mouth at bedtime.      pravastatin (PRAVACHOL) 40 MG tablet TAKE 1 TABLET(40 MG) BY MOUTH EVERY EVENING (Patient taking differently: Take 40 mg by mouth every evening.) 90 tablet 0   SYMBICORT 80-4.5 MCG/ACT inhaler Take 2 puffs by mouth 2 (two) times daily.  11    Allergies as of 10/24/2021 - Review Complete 02/19/2021  Allergen Reaction Noted   Doxycycline hyclate Rash 08/10/2017   Ace inhibitors  06/25/2020   Hydrocodone bit-homatrop mbr Other (See Comments) 06/25/2020   Codeine Nausea Only 03/18/2012    Social History   Socioeconomic History   Marital status: Married    Spouse name: Not on file   Number of children: 2   Years of education: Not on file   Highest education level: Not on file  Occupational History   Occupation: part-time office asst.  Tobacco Use    Smoking status: Former    Years: 45.00    Types: Cigarettes   Smokeless tobacco: Never   Tobacco comments:    maybe 5-10 cigarettes a day  starts and stops smoking every few days  Vaping Use   Vaping Use: Never used  Substance and Sexual Activity   Alcohol use: Yes    Alcohol/week: 0.0 - 1.0 standard drinks of alcohol    Comment: social   Drug use: No   Sexual activity: Not on file  Other Topics Concern   Not on file  Social History Narrative   Not on file   Social Determinants of Health   Financial Resource Strain: Not on file  Food Insecurity: Not on file  Transportation Needs: Not on file  Physical Activity: Not on file  Stress: Not on file  Social Connections: Not on file  Intimate Partner Violence: Not on file    Review of Systems: All systems reviewed and negative except where noted in HPI.  Physical Exam: Vital signs in last 24 hours: Temp:  [97.5 F (36.4 C)-98.5 F (36.9 C)] 98.1 F (36.7 C) (10/13 0738) Pulse Rate:  [72-87] 75 (10/13 0738) Resp:  [11-24] 15 (10/13 0738) BP: (108-124)/(50-66) 120/57 (10/13 0738) SpO2:  [94 %-100 %] 96 % (10/13 0865)    General:  Alert female in NAD Psych:  Pleasant, cooperative. Normal mood and affect Eyes: Pupils equal Ears:  Normal auditory acuity Nose: No deformity, discharge or lesions Neck:  Supple, no masses felt Lungs:  Coarse breath sounds bilaterally. Marland Kitchen  Heart:  Regular rate, regular rhythm, + loud murmur. No lower extremity edema Abdomen:  Soft, nondistended, nontender, active bowel sounds, no masses felt Rectal :  Deferred Msk: Symmetrical without gross deformities.  Neurologic:  Alert, oriented, grossly normal neurologically Skin:  Intact without significant lesions.    Intake/Output from previous day: 10/12 0701 - 10/13 0700 In: 300 [Blood:300] Out: -  Intake/Output this shift:  Total I/O In: 315 [Blood:315] Out: -     Principal Problem:   GI bleed Active Problems:   Type 2 diabetes  mellitus without complication, without long-term current use of insulin (HCC)   Essential hypertension   ABLA (acute blood loss anemia)    Tye Savoy, NP-C @  10/25/2021, 9:06 AM

## 2021-10-25 NOTE — Assessment & Plan Note (Addendum)
Hemoccult positive. Sounds like shes having intermittent melena, suspicious for UGIB. HPI is very suspicious for NSAID ulcer as cause. 1. NPO 2. Transfuse as below 3. Tele monitor 4. EDP sent message to [facility] for consult, presumably EGD. 1. I also sent a message to Dr. Lorenso Courier to update with the NSAID history. 5. Stop NSAIDs for the moment obviously. 6. PPI bolus and gtt.

## 2021-10-25 NOTE — Progress Notes (Signed)
Mobility Specialist - Progress Note   10/25/21 1517  Mobility  Activity Ambulated with assistance in hallway  Level of Assistance Standby assist, set-up cues, supervision of patient - no hands on  Assistive Device None  Distance Ambulated (ft) 350 ft  Activity Response Tolerated well  $Mobility charge 1 Mobility    Pt received in bed and agreeable. Left in BR w/ all needs met.   Paulla Dolly Mobility Specialist

## 2021-10-25 NOTE — Op Note (Signed)
[facility] Patient Name: Mandy Delacruz Procedure Date : 10/25/2021 MRN: [id number] Attending MD: Georgian Co ,  Date of Birth: 1945/04/11 CSN: [id number] Age: 76 Admit Type: Outpatient Procedure:                Upper GI endoscopy Indications:              Melena Providers:                Adline Mango" Georga Hacking, RN, Cherylynn Ridges, Technician Referring MD:             Hospitalist Medicines:                Monitored Anesthesia Care Complications:            No immediate complications. Estimated Blood Loss:     Estimated blood loss: none. Procedure:                Pre-Anesthesia Assessment:                           - Prior to the procedure, a History and Physical                            was performed, and patient medications and                            allergies were reviewed. The patient's tolerance of                            previous anesthesia was also reviewed. The risks                            and benefits of the procedure and the sedation                            options and risks were discussed with the patient.                            All questions were answered, and informed consent                            was obtained. Prior Anticoagulants: The patient has                            taken no previous anticoagulant or antiplatelet                            agents. ASA Grade Assessment: II - A patient with                            mild systemic disease. After reviewing the risks  and benefits, the patient was deemed in                            satisfactory condition to undergo the procedure.                           After obtaining informed consent, the endoscope was                            passed under direct vision. Throughout the                            procedure, the patient's blood pressure, pulse, and                            oxygen saturations  were monitored continuously. The                            [id number]-[id number] ([id number]) Olympus endoscope was introduced                            through the mouth, and advanced to the third part                            of duodenum. The upper GI endoscopy was                            accomplished without difficulty. The patient                            tolerated the procedure well. Scope In: Scope Out: Findings:      The examined esophagus was normal.      A small hiatal hernia was present.      Localized inflammation characterized by congestion (edema), erosions and       shallow small ulcerations was found in the gastric antrum.      The examined duodenum was normal. Impression:               - Normal esophagus.                           - Small hiatal hernia.                           - Gastritis.                           - Normal examined duodenum.                           - No specimens collected. Recommendation:           - Return patient to hospital ward for ongoing care.                           - Patient's melena and anemia may be due to small  gastric ulcers and erosions. However with a low Hb                            in the 6s and unclear timeline for her anemia,                            would recommend a colonoscopy tomorrow.                           - Use a proton pump inhibitor PO BID for 8 weeks.                           - Check H pylori antibody tomorrow.                           - No ibuprofen, naproxen, or other non-steroidal                            anti-inflammatory drugs.                           - Perform a colonoscopy tomorrow.                           - The findings and recommendations were discussed                            with the patient. Procedure Code(s):        --- Professional ---                           [id number], Esophagogastroduodenoscopy, flexible,                            transoral; diagnostic, including  collection of                            specimen(s) by brushing or washing, when performed                            (separate procedure) Diagnosis Code(s):        --- Professional ---                           K44.9, Diaphragmatic hernia without obstruction or                            gangrene                           K29.70, Gastritis, unspecified, without bleeding                           K92.1, Melena (includes Hematochezia) CPT copyright 2019 [employer]. All rights reserved. The codes documented in this report are preliminary and upon coder review may  be revised to meet current  compliance requirements. Dr Georgian Co "Lyndee Leo" Lorenso Courier,  10/25/2021 1:42:49 PM Number of Addenda: 0

## 2021-10-26 ENCOUNTER — Encounter (HOSPITAL_COMMUNITY)
Admission: EM | Disposition: A | Discharge status: Home / Self Care | Payer: Self-pay | Source: Home / Self Care | Attending: Internal Medicine

## 2021-10-26 ENCOUNTER — Inpatient Hospital Stay (HOSPITAL_COMMUNITY): Payer: BC Managed Care – PPO | Admitting: Certified Registered"

## 2021-10-26 ENCOUNTER — Encounter (HOSPITAL_COMMUNITY): Payer: Self-pay | Admitting: Internal Medicine

## 2021-10-26 DIAGNOSIS — R195 Other fecal abnormalities: Secondary | ICD-10-CM

## 2021-10-26 DIAGNOSIS — D500 Iron deficiency anemia secondary to blood loss (chronic): Secondary | ICD-10-CM

## 2021-10-26 DIAGNOSIS — Q438 Other specified congenital malformations of intestine: Secondary | ICD-10-CM

## 2021-10-26 DIAGNOSIS — K921 Melena: Secondary | ICD-10-CM | POA: Diagnosis not present

## 2021-10-26 HISTORY — PX: COLONOSCOPY WITH PROPOFOL: SHX5780

## 2021-10-26 LAB — URINE CULTURE: Culture: >=100000 — AB

## 2021-10-26 LAB — BASIC METABOLIC PANEL
Anion gap: 7 (ref 5–15)
BUN: 13 mg/dL (ref 8–23)
CO2: 23 mmol/L (ref 22–32)
Calcium: 9.3 mg/dL (ref 8.9–10.3)
Chloride: 104 mmol/L (ref 98–111)
Creatinine, Ser: 0.91 mg/dL (ref 0.44–1.00)
GFR, Estimated: >60 mL/min (ref >60)
Glucose, Bld: 125 mg/dL — ABNORMAL HIGH (ref 70–99)
Potassium: 4.2 mmol/L (ref 3.5–5.1)
Sodium: 134 mmol/L — ABNORMAL LOW (ref 135–145)

## 2021-10-26 LAB — TYPE AND SCREEN
ABO/RH(D): A NEG
Antibody Screen: NEGATIVE
Unit division: 0
Unit division: 0

## 2021-10-26 LAB — GLUCOSE, CAPILLARY
Glucose-Capillary: 104 mg/dL — ABNORMAL HIGH (ref 70–99)
Glucose-Capillary: 118 mg/dL — ABNORMAL HIGH (ref 70–99)
Glucose-Capillary: 161 mg/dL — ABNORMAL HIGH (ref 70–99)

## 2021-10-26 LAB — BPAM RBC
Blood Product Expiration Date: 202310262359
Blood Product Expiration Date: 202310272359
ISSUE DATE / TIME: 202310130151
ISSUE DATE / TIME: 202310130636
Unit Type and Rh: 600
Unit Type and Rh: 600

## 2021-10-26 LAB — CBC
HCT: 28.1 % — ABNORMAL LOW (ref 36.0–46.0)
Hemoglobin: 9.3 g/dL — ABNORMAL LOW (ref 12.0–15.0)
MCH: 29.1 pg (ref 26.0–34.0)
MCHC: 33.1 g/dL (ref 30.0–36.0)
MCV: 87.8 fL (ref 80.0–100.0)
Platelets: 285 10*3/uL (ref 150–400)
RBC: 3.2 MIL/uL — ABNORMAL LOW (ref 3.87–5.11)
RDW: 16.3 % — ABNORMAL HIGH (ref 11.5–15.5)
WBC: 5 10*3/uL (ref 4.0–10.5)
nRBC: 0 % (ref 0.0–0.2)

## 2021-10-26 SURGERY — COLONOSCOPY WITH PROPOFOL
Anesthesia: Monitor Anesthesia Care

## 2021-10-26 MED ORDER — PROPOFOL 500 MG/50ML IV EMUL
INTRAVENOUS | Status: DC | PRN
  Administered 2021-10-26: 150 ug/kg/min via INTRAVENOUS

## 2021-10-26 MED ORDER — SODIUM CHLORIDE 0.9 % IV SOLN: INTRAVENOUS | Status: DC

## 2021-10-26 MED ORDER — FERROUS SULFATE 325 (65 FE) MG PO TABS: 325.0000 mg | ORAL_TABLET | Freq: Every day | ORAL | 3 refills | Status: AC

## 2021-10-26 MED ORDER — PANTOPRAZOLE SODIUM 40 MG PO TBEC: 40.0000 mg | DELAYED_RELEASE_TABLET | Freq: Every day | ORAL | 2 refills | Status: AC

## 2021-10-26 MED ORDER — SODIUM CHLORIDE 0.9 % IV SOLN
510.0000 mg | Freq: Once | INTRAVENOUS | Status: AC
  Administered 2021-10-26: 510 mg via INTRAVENOUS
  Filled 2021-10-26: qty 17

## 2021-10-26 MED ORDER — CEPHALEXIN 500 MG PO CAPS: 500.0000 mg | ORAL_CAPSULE | Freq: Four times a day (QID) | ORAL | 0 refills | Status: AC

## 2021-10-26 MED ORDER — LACTATED RINGERS IV SOLN
INTRAVENOUS | Status: AC | PRN
  Administered 2021-10-26: 1000 mL via INTRAVENOUS

## 2021-10-26 SURGICAL SUPPLY — 22 items

## 2021-10-26 NOTE — Discharge Summary (Signed)
[facility]  Physician Discharge Summary   Patient ID: Mandy Delacruz MRN: [id number] DOB/AGE: 06/23/1945 76 y.o.  Admit date: 10/24/2021 Discharge date: 10/26/2021    PCP: [name], MD  DISCHARGE DIAGNOSES:    GI bleed   ABLA (acute blood loss anemia) Iron deficiency   Type 2 diabetes mellitus without complication, without long-term current use of insulin (HCC)   Essential hypertension   Anemia   Anemia due to chronic blood loss   RECOMMENDATIONS FOR OUTPATIENT FOLLOW UP: GI to arrange outpatient follow-up and consider outpatient capsule endoscopy   Home Health: None Equipment/Devices: None  CODE STATUS: Full code  DISCHARGE CONDITION: fair  Diet recommendation: As before  INITIAL HISTORY: 76 year old female with a past medical history of essential hypertension, hyperlipidemia presented with intermittent dark stools with diarrhea starting about 2 weeks ago.  Also had nausea and vomiting about a week prior to admission.  Went to her primary care physician's office and was found to have a hemoglobin of 7.2.  She was sent to the emergency department.  She admits to taking ibuprofen on a daily basis for arthritis pain.   Consultants: Gastroenterology   Procedures:  EGD  Impression:               - Normal esophagus.                           - Small hiatal hernia.                           - Gastritis.                           - Normal examined duodenum.                           - No specimens collected.   Colonoscopy Impression:               - Diverticulosis in the entire examined colon.                           - Redundant colon.                           - The examination was otherwise normal on direct                            and retroflexion views.                           - No specimens collected.   HOSPITAL COURSE:   GI bleed, likely upper Presented with history suggestive of melanotic stools.  Has been taking ibuprofen on a daily  basis.  She has had endoscopies previously with the last one was about 9 years ago. Patient was seen by gastroenterology.  Underwent EGD which showed gastritis.  Subsequently underwent colonoscopy which showed only diverticulosis.  No active bleeding was noted.  Patient has not had any further recurrence of melanotic stool or hematochezia.  Hemoglobin is stable after transfusion.  Plan is to continue with PPI.  GI to consider outpatient capsule endoscopy. CT angiogram of the abdomen did not show any acute bleeding.  Acute blood loss anemia/iron deficiency Noted to have a hemoglobin of 6.7 at admission.  She was transfused 2 units of PRBC.  Hemoglobin responded appropriately.  Noted to be 9.3 today.   Anemia panel showed evidence of iron deficiency.  She will be given 1 iron infusion prior to discharge.  She will be given a prescription for ferrous sulfate at discharge.   Acute pyelonephritis/E. coli CT angiogram did raise concern for pyelonephritis.  Urine culture growing E. coli.  Sensitivities noted.  Continue ceftriaxone while she is in the hospital.  Can be transitioned to cephalexin at discharge.     Essential hypertension Holding antihypertensives.  Blood pressure is reasonably well controlled.   Diabetes mellitus type 2 without complications YKD9I 6.0.  CBGs are reasonably well controlled.   Patient feels well.  Was given iron infusion for her low iron detected on blood work.  The will be discharged on ferrous sulfate.  Will be discharged on Keflex for UTI.  Okay for discharge after colonoscopy.   PERTINENT LABS:  The results of significant diagnostics from this hospitalization (including imaging, microbiology, ancillary and laboratory) are listed below for reference.    Microbiology: Recent Results (from the past 240 hour(s))  Urine Culture     Status: Abnormal   Collection Time: 10/24/21 10:15 PM   Specimen: Urine, Clean Catch  Result Value Ref Range Status   Specimen  Description URINE, CLEAN CATCH  Final   Special Requests   Final    NONE Performed at [facility], [address] [place], [place] [address]    Culture >=100,000 COLONIES/mL ESCHERICHIA COLI (A)  Final   Report Status 10/26/2021 FINAL  Final   Organism ID, Bacteria ESCHERICHIA COLI (A)  Final      Susceptibility   Escherichia coli - MIC*    AMPICILLIN >=32 RESISTANT Resistant     CEFAZOLIN <=4 SENSITIVE Sensitive     CEFEPIME <=0.12 SENSITIVE Sensitive     CEFTRIAXONE <=0.25 SENSITIVE Sensitive     CIPROFLOXACIN <=0.25 SENSITIVE Sensitive     GENTAMICIN <=1 SENSITIVE Sensitive     IMIPENEM <=0.25 SENSITIVE Sensitive     NITROFURANTOIN <=16 SENSITIVE Sensitive     TRIMETH/SULFA >=320 RESISTANT Resistant     AMPICILLIN/SULBACTAM >=32 RESISTANT Resistant     PIP/TAZO <=4 SENSITIVE Sensitive     * >=100,000 COLONIES/mL ESCHERICHIA COLI  Blood culture (routine x 2)     Status: None (Preliminary result)   Collection Time: 10/24/21 10:31 PM   Specimen: BLOOD  Result Value Ref Range Status   Specimen Description BLOOD RIGHT ANTECUBITAL  Final   Special Requests   Final    BOTTLES DRAWN AEROBIC AND ANAEROBIC Blood Culture adequate volume   Culture   Final    NO GROWTH 2 DAYS Performed at [facility], [address] [place], [place] [address]    Report Status PENDING  Incomplete  Resp Panel by RT-PCR (Flu A&B, Covid) Anterior Nasal Swab     Status: None   Collection Time: 10/24/21 10:31 PM   Specimen: Anterior Nasal Swab  Result Value Ref Range Status   SARS Coronavirus 2 by RT PCR NEGATIVE NEGATIVE Final    Comment: (NOTE) SARS-CoV-2 target nucleic acids are NOT DETECTED.  The SARS-CoV-2 RNA is generally detectable in upper respiratory specimens during the acute phase of infection. The lowest concentration of SARS-CoV-2 viral copies this assay can detect is 138 copies/mL. A negative result does not preclude SARS-Cov-2 infection and should  not be used as the sole  basis for treatment or other patient management decisions. A negative result may occur with  improper specimen collection/handling, submission of specimen other than nasopharyngeal swab, presence of viral mutation(s) within the areas targeted by this assay, and inadequate number of viral copies(<138 copies/mL). A negative result must be combined with clinical observations, patient history, and epidemiological information. The expected result is Negative.  Fact Sheet for Patients:  EntrepreneurPulse.com.au  Fact Sheet for Healthcare Providers:  IncredibleEmployment.be  This test is no t yet approved or cleared by the [place] FDA and  has been authorized for detection and/or diagnosis of SARS-CoV-2 by FDA under an Emergency Use Authorization (EUA). This EUA will remain  in effect (meaning this test can be used) for the duration of the COVID-19 declaration under Section 564(b)(1) of the Act, 21 U.S.C.section 360bbb-3(b)(1), unless the authorization is terminated  or revoked sooner.       Influenza A by PCR NEGATIVE NEGATIVE Final   Influenza B by PCR NEGATIVE NEGATIVE Final    Comment: (NOTE) The Xpert Xpress SARS-CoV-2/FLU/RSV plus assay is intended as an aid in the diagnosis of influenza from Nasopharyngeal swab specimens and should not be used as a sole basis for treatment. Nasal washings and aspirates are unacceptable for Xpert Xpress SARS-CoV-2/FLU/RSV testing.  Fact Sheet for Patients: EntrepreneurPulse.com.au  Fact Sheet for Healthcare Providers: IncredibleEmployment.be  This test is not yet approved or cleared by the [place] FDA and has been authorized for detection and/or diagnosis of SARS-CoV-2 by FDA under an Emergency Use Authorization (EUA). This EUA will remain in effect (meaning this test can be used) for the duration of the COVID-19 declaration under Section 564(b)(1) of the Act,  21 U.S.C. section 360bbb-3(b)(1), unless the authorization is terminated or revoked.  Performed at [facility], [place] [address], [place], [place] [address]   Blood culture (routine x 2)     Status: None (Preliminary result)   Collection Time: 10/25/21  7:51 PM   Specimen: BLOOD  Result Value Ref Range Status   Specimen Description BLOOD SITE NOT SPECIFIED  Final   Special Requests   Final    BOTTLES DRAWN AEROBIC AND ANAEROBIC Blood Culture adequate volume   Culture   Final    NO GROWTH < 24 HOURS Performed at [facility], [place] [address], [place], [place] [address]    Report Status PENDING  Incomplete     Labs:   Basic Metabolic Panel: Recent Labs  Lab 10/24/21 2231 10/25/21 0650 10/26/21 0209  NA 134* 134* 134*  K 4.4 4.5 4.2  CL 101 103 104  CO2 '24 22 23  '$ GLUCOSE 104* 127* 125*  BUN '20 17 13  '$ CREATININE 1.02* 0.98 0.91  CALCIUM 9.6 9.3 9.3   Liver Function Tests: Recent Labs  Lab 10/24/21 2231  AST 22  ALT 34  ALKPHOS 101  BILITOT 0.6  PROT 6.4*  ALBUMIN 2.8*    CBC: Recent Labs  Lab 10/24/21 2231 10/25/21 0650 10/25/21 1609 10/26/21 0209  WBC 6.8 6.4  --  5.0  NEUTROABS 4.2  --   --   --   HGB 6.7* 8.2* 9.7* 9.3*  HCT 21.8* 25.4* 28.9* 28.1*  MCV 90.1 88.8  --  87.8  PLT 337 307  --  285     CBG: Recent Labs  Lab 10/25/21 1511 10/25/21 1950 10/26/21 0016 10/26/21 0432 10/26/21 0749  GLUCAP 145* 140* 161* 118* 104*  IMAGING STUDIES CT ANGIO GI BLEED  Result Date: 10/25/2021 CLINICAL DATA:  Lower GI bleed EXAM: CTA ABDOMEN AND PELVIS WITHOUT AND WITH CONTRAST TECHNIQUE: Multidetector CT imaging of the abdomen and pelvis was performed using the standard protocol during bolus administration of intravenous contrast. Multiplanar reconstructed images and MIPs were obtained and reviewed to evaluate the vascular anatomy. RADIATION DOSE REDUCTION: This exam was performed according to the departmental dose-optimization program  which includes automated exposure control, adjustment of the mA and/or kV according to patient size and/or use of iterative reconstruction technique. CONTRAST:  159m OMNIPAQUE IOHEXOL 350 MG/ML SOLN COMPARISON:  None Available. FINDINGS: VASCULAR Aorta: Aortic atherosclerosis.  No dissection or aneurysm. Celiac: Patent without evidence of aneurysm, dissection, vasculitis or significant stenosis. SMA: Patent without evidence of aneurysm, dissection, vasculitis or significant stenosis. Renals: Both renal arteries are patent without evidence of aneurysm, dissection, vasculitis, fibromuscular dysplasia or significant stenosis. IMA: Severe stenosis at the IMA origin, but otherwise normal. Inflow: Patent without evidence of aneurysm, dissection, vasculitis or significant stenosis. Proximal Outflow: Bilateral common femoral and visualized portions of the superficial and profunda femoral arteries are patent without evidence of aneurysm, dissection, vasculitis or significant stenosis. Veins: No obvious venous abnormality within the limitations of this arterial phase study. Review of the MIP images confirms the above findings. NON-VASCULAR Lower chest: No acute abnormality. Hepatobiliary: No focal liver abnormality is seen. Status post cholecystectomy. No biliary dilatation. Enhancing focus in the left hepatic lobe is likely a hemangioma. Pancreas: Unremarkable. No pancreatic ductal dilatation or surrounding inflammatory changes. Spleen: Normal in size without focal abnormality. Adrenals/Urinary Tract: There is mild inhomogeneity of the parenchymal contrast enhancement pattern of the right kidney. Stomach/Bowel: No gastrointestinal hemorrhage identified. No small bowel obstruction or focal colonic abnormality. Lymphatic: No lymphadenopathy Reproductive: Uterus and bilateral adnexa are unremarkable. Other: Negative Musculoskeletal: Negative IMPRESSION: 1. No gastrointestinal hemorrhage identified. 2. Mild inhomogeneity of the  parenchymal contrast enhancement pattern of the right kidney. Correlate with urinalysis to exclude pyelonephritis. Aortic Atherosclerosis (ICD10-I70.0). Electronically Signed   By: KUlyses JarredM.D.   On: 10/25/2021 03:29   DG Chest 2 View  Result Date: 10/14/2021 CLINICAL DATA:  Fever EXAM: CHEST - 2 VIEW.  Patient is rotated on frontal view. COMPARISON:  Chest x-ray 06/01/2017, CT chest 02/19/2021 FINDINGS: The heart and mediastinal contours are within normal limits. Aortic calcification. Prominent right hilar region likely due to patient rotation. No focal consolidation. No pulmonary edema. No pleural effusion. No pneumothorax. No acute osseous abnormality. IMPRESSION: 1. No active cardiopulmonary disease. 2.  Aortic Atherosclerosis (ICD10-I70.0). Electronically Signed   By: MIven FinnM.D.   On: 10/14/2021 17:11    DISCHARGE EXAMINATION: See progress note from earlier today  DISPOSITION: Home  Discharge Instructions     Call MD for:  difficulty breathing, headache or visual disturbances   Complete by: As directed    Call MD for:  extreme fatigue   Complete by: As directed    Call MD for:  persistant dizziness or light-headedness   Complete by: As directed    Call MD for:  persistant nausea and vomiting   Complete by: As directed    Call MD for:  severe uncontrolled pain   Complete by: As directed    Call MD for:  temperature >100.4   Complete by: As directed    Diet - low sodium heart healthy   Complete by: As directed    Discharge instructions   Complete by: As directed    Gastroenterology  will arrange outpatient follow-up for capsule study.  Iron prescriptions have been sent to your pharmacy.  Prescriptions for antibiotics for your urinary tract infection has also been sent to the pharmacy.  Take your medications as prescribed.  Seek attention immediately if you can see blood in the stool or vomit.  Iron will turn your stool dark but should not turn it black in color.  You  were cared for by a hospitalist during your hospital stay. If you have any questions about your discharge medications or the care you received while you were in the hospital after you are discharged, you can call the unit and asked to speak with the hospitalist on call if the hospitalist that took care of you is not available. Once you are discharged, your primary care physician will handle any further medical issues. Please note that NO REFILLS for any discharge medications will be authorized once you are discharged, as it is imperative that you return to your primary care physician (or establish a relationship with a primary care physician if you do not have one) for your aftercare needs so that they can reassess your need for medications and monitor your lab values. If you do not have a primary care physician, you can call [phone] for a physician referral.   Increase activity slowly   Complete by: As directed          Allergies as of 10/26/2021       Reactions   Doxycycline Hyclate Rash   Blisters on hands and feet   Ace Inhibitors    Other reaction(s): cough   Hydrocodone Bit-homatrop Mbr Nausea Only   Codeine Nausea Only        Medication List     STOP taking these medications    ibuprofen 200 MG tablet Commonly known as: ADVIL   omeprazole 20 MG tablet Commonly known as: PRILOSEC OTC       TAKE these medications    cephALEXin 500 MG capsule Commonly known as: KEFLEX Take 1 capsule (500 mg total) by mouth 4 (four) times daily for 10 days.   diphenhydrAMINE 25 MG tablet Commonly known as: BENADRYL Take 25 mg by mouth at bedtime.   ferrous sulfate 325 (65 FE) MG tablet Take 1 tablet (325 mg total) by mouth daily.   fexofenadine 180 MG tablet Commonly known as: ALLEGRA Take 180 mg by mouth daily as needed for allergies or rhinitis.   losartan-hydrochlorothiazide 50-12.5 MG tablet Commonly known as: HYZAAR Take 1/2 tablet by mouth once daily   pantoprazole  40 MG tablet Commonly known as: Protonix Take 1 tablet (40 mg total) by mouth daily.   pravastatin 40 MG tablet Commonly known as: PRAVACHOL TAKE 1 TABLET(40 MG) BY MOUTH EVERY EVENING What changed: See the new instructions.   PROAIR HFA IN Inhale 2 puffs into the lungs 4 (four) times daily as needed (wheezing).   Symbicort 80-4.5 MCG/ACT inhaler Generic drug: budesonide-formoterol Take 2 puffs by mouth 2 (two) times daily.          Follow-up Information     [name], MD. Schedule an appointment as soon as possible for a visit in 1 week(s).   Specialty: Family Medicine Why: post hospitalization follow up Contact information: [place] [address] [phone]                 [place]: 35 minutes  Stephen  [facility] Pager on [email or web address]  10/27/2021, 1:26  PM

## 2021-10-26 NOTE — Anesthesia Postprocedure Evaluation (Signed)
Anesthesia Post Note  Patient: Mandy Delacruz  Procedure(s) Performed: COLONOSCOPY WITH PROPOFOL     Patient location during evaluation: Endoscopy Anesthesia Type: MAC Level of consciousness: awake and alert Pain management: pain level controlled Vital Signs Assessment: post-procedure vital signs reviewed and stable Respiratory status: spontaneous breathing, nonlabored ventilation, respiratory function stable and patient connected to nasal cannula oxygen Cardiovascular status: stable and blood pressure returned to baseline Postop Assessment: no apparent nausea or vomiting Anesthetic complications: no   No notable events documented.  Last Vitals:  Vitals:   10/26/21 1310 10/26/21 1340  BP: (!) 114/51 (!) 143/64  Pulse: 70 63  Resp: (!) 22 16  Temp:  36.7 C  SpO2: 98% 99%    Last Pain:  Vitals:   10/26/21 1340  TempSrc: Oral  PainSc:                  March Rummage Ariq Khamis

## 2021-10-26 NOTE — Transfer of Care (Signed)
Immediate Anesthesia Transfer of Care Note  Patient: Mandy Delacruz  Procedure(s) Performed: COLONOSCOPY WITH PROPOFOL  Patient Location: PACU  Anesthesia Type:MAC  Level of Consciousness: awake, alert  and oriented  Airway & Oxygen Therapy: Patient Spontanous Breathing  Post-op Assessment: Report given to RN and Post -op Vital signs reviewed and stable  Post vital signs: Reviewed and stable  Last Vitals:  Vitals Value Taken Time  BP    Temp    Pulse    Resp    SpO2      Last Pain:  Vitals:   10/26/21 1134  TempSrc: Temporal  PainSc: 0-No pain         Complications: No notable events documented.

## 2021-10-26 NOTE — Interval H&P Note (Signed)
History and Physical Interval Note:  10/26/2021 12:10 PM  Mandy Delacruz  has presented today for surgery, with the diagnosis of Melena, anemia.  The various methods of treatment have been discussed with the patient and family. After consideration of risks, benefits and other options for treatment, the patient has consented to  Procedure(s): COLONOSCOPY WITH PROPOFOL (N/A) as a surgical intervention.  The patient's history has been reviewed, patient examined, no change in status, stable for surgery.  I have reviewed the patient's chart and labs.  Questions were answered to the patient's satisfaction.    No overt bleeding - Hgb stable at 8.1 today  [name]

## 2021-10-26 NOTE — Plan of Care (Signed)
  Problem: Education: Goal: Ability to describe self-care measures that may prevent or decrease complications (Diabetes Survival Skills Education) will improve Outcome: Progressing   Problem: Education: Goal: Knowledge of General Education information will improve Description: Including pain rating scale, medication(s)/side effects and non-pharmacologic comfort measures Outcome: Progressing   Problem: Health Behavior/Discharge Planning: Goal: Ability to manage health-related needs will improve Outcome: Progressing

## 2021-10-26 NOTE — Progress Notes (Signed)
Nsg Discharge Note  Admit Date:  10/24/2021 Discharge date: 10/26/2021   LETISIA SCHWALB to be D/C'd Home per MD order.  AVS completed.   Removed Ivs-CDI. SWOT reviewed d/c paperwork with patient. Patient/caregiver able to verbalize understanding.  Discharge Medication: Allergies as of 10/26/2021       Reactions   Doxycycline Hyclate Rash   Blisters on hands and feet   Ace Inhibitors    Other reaction(s): cough   Hydrocodone Bit-homatrop Mbr Nausea Only   Codeine Nausea Only        Medication List     STOP taking these medications    ibuprofen 200 MG tablet Commonly known as: ADVIL   omeprazole 20 MG tablet Commonly known as: PRILOSEC OTC       TAKE these medications    cephALEXin 500 MG capsule Commonly known as: KEFLEX Take 1 capsule (500 mg total) by mouth 4 (four) times daily for 10 days.   diphenhydrAMINE 25 MG tablet Commonly known as: BENADRYL Take 25 mg by mouth at bedtime.   ferrous sulfate 325 (65 FE) MG tablet Take 1 tablet (325 mg total) by mouth daily.   fexofenadine 180 MG tablet Commonly known as: ALLEGRA Take 180 mg by mouth daily as needed for allergies or rhinitis.   losartan-hydrochlorothiazide 50-12.5 MG tablet Commonly known as: HYZAAR Take 1/2 tablet by mouth once daily   pantoprazole 40 MG tablet Commonly known as: Protonix Take 1 tablet (40 mg total) by mouth daily.   pravastatin 40 MG tablet Commonly known as: PRAVACHOL TAKE 1 TABLET(40 MG) BY MOUTH EVERY EVENING What changed: See the new instructions.   PROAIR HFA IN Inhale 2 puffs into the lungs 4 (four) times daily as needed (wheezing).   Symbicort 80-4.5 MCG/ACT inhaler Generic drug: budesonide-formoterol Take 2 puffs by mouth 2 (two) times daily.        Discharge Assessment: Vitals:   10/26/21 1310 10/26/21 1340  BP: (!) 114/51 (!) 143/64  Pulse: 70 63  Resp: (!) 22 16  Temp:  98.1 F (36.7 C)  SpO2: 98% 99%   Skin clean, dry and intact without evidence  of skin break down, no evidence of skin tears noted. IV catheter discontinued intact. Site without signs and symptoms of complications - no redness or edema noted at insertion site, patient denies c/o pain - only slight tenderness at site.  Dressing with slight pressure applied.  D/c Instructions-Education: Discharge instructions given to patient/family with verbalized understanding. D/c education completed with patient/family including follow up instructions, medication list, d/c activities limitations if indicated, with other d/c instructions as indicated by MD - patient able to verbalize understanding, all questions fully answered. Patient instructed to return to ED, call 911, or call MD for any changes in condition.  Patient escorted via [place], and D/C home via private auto.  Santa Lighter, RN 10/26/2021 4:23 PM

## 2021-10-26 NOTE — Anesthesia Procedure Notes (Signed)
Procedure Name: MAC Date/Time: 10/26/2021 12:20 PM  Performed by: Babs Bertin, CRNAPre-anesthesia Checklist: Patient identified, Emergency Drugs available, Suction available, Patient being monitored and Timeout performed Patient Re-evaluated:Patient Re-evaluated prior to induction Oxygen Delivery Method: Nasal cannula

## 2021-10-26 NOTE — Anesthesia Preprocedure Evaluation (Signed)
Anesthesia Evaluation  Patient identified by MRN, date of birth, ID band Patient awake    Reviewed: Allergy & Precautions, NPO status , Patient's Chart, lab work & pertinent test results  Airway Mallampati: II  TM Distance: >3 FB Neck ROM: Full    Dental no notable dental hx.    Pulmonary asthma , former smoker,    Pulmonary exam normal        Cardiovascular hypertension, + dysrhythmias  Rhythm:Regular Rate:Normal     Neuro/Psych negative neurological ROS  negative psych ROS   GI/Hepatic Neg liver ROS, hiatal hernia, GERD  Medicated,  Endo/Other  diabetes  Renal/GU negative Renal ROS  negative genitourinary   Musculoskeletal negative musculoskeletal ROS (+)   Abdominal Normal abdominal exam  (+)   Peds  Hematology  (+) Blood dyscrasia, anemia , Lab Results      Component                Value               Date                      WBC                      5.0                 10/26/2021                HGB                      9.3 (L)             10/26/2021                HCT                      28.1 (L)            10/26/2021                MCV                      87.8                10/26/2021                PLT                      285                 10/26/2021           Lab Results      Component                Value               Date                      NA                       134 (L)             10/26/2021                K  4.2                 10/26/2021                CO2                      23                  10/26/2021                GLUCOSE                  125 (H)             10/26/2021                BUN                      13                  10/26/2021                CREATININE               0.91                10/26/2021                CALCIUM                  9.3                 10/26/2021                GFRNONAA                 >60                 10/26/2021              Anesthesia Other Findings   Reproductive/Obstetrics                             Anesthesia Physical Anesthesia Plan  ASA: 2  Anesthesia Plan: MAC   Post-op Pain Management:    Induction: Intravenous  PONV Risk Score and Plan: 2 and Propofol infusion and Treatment may vary due to age or medical condition  Airway Management Planned: Simple Face Mask, Natural Airway and Nasal Cannula  Additional Equipment: None  Intra-op Plan:   Post-operative Plan:   Informed Consent: I have reviewed the patients History and Physical, chart, labs and discussed the procedure including the risks, benefits and alternatives for the proposed anesthesia with the patient or authorized representative who has indicated his/her understanding and acceptance.     Dental advisory given  Plan Discussed with:   Anesthesia Plan Comments:         Anesthesia Quick Evaluation

## 2021-10-26 NOTE — Progress Notes (Signed)
  Transition of Care [facility]) Screening Note   Patient Details  Name: Mandy Delacruz Date of Birth: [birth year]   Transition of Care [facility]) CM/SW Contact:    Bartholomew Crews, RN Phone Number: [phone] 10/26/2021, 3:03 PM    Transition of Care Department [facility]) has reviewed patient and no TOC needs have been identified at this time. We will continue to monitor patient advancement through interdisciplinary progression rounds. If new patient transition needs arise, please place a TOC consult.

## 2021-10-26 NOTE — Plan of Care (Signed)
  Problem: Education: Goal: Ability to describe self-care measures that may prevent or decrease complications (Diabetes Survival Skills Education) will improve 10/26/2021 1450 by Santa Lighter, RN Outcome: Adequate for Discharge 10/26/2021 1225 by Santa Lighter, RN Outcome: Progressing Goal: Individualized Educational Video(s) Outcome: Adequate for Discharge   Problem: Coping: Goal: Ability to adjust to condition or change in health will improve Outcome: Adequate for Discharge   Problem: Fluid Volume: Goal: Ability to maintain a balanced intake and output will improve Outcome: Adequate for Discharge   Problem: Health Behavior/Discharge Planning: Goal: Ability to identify and utilize available resources and services will improve Outcome: Adequate for Discharge Goal: Ability to manage health-related needs will improve Outcome: Adequate for Discharge   Problem: Fluid Volume: Goal: Ability to maintain a balanced intake and output will improve Outcome: Adequate for Discharge   Problem: Health Behavior/Discharge Planning: Goal: Ability to identify and utilize available resources and services will improve Outcome: Adequate for Discharge Goal: Ability to manage health-related needs will improve Outcome: Adequate for Discharge   Problem: Metabolic: Goal: Ability to maintain appropriate glucose levels will improve Outcome: Adequate for Discharge   Problem: Nutritional: Goal: Maintenance of adequate nutrition will improve Outcome: Adequate for Discharge Goal: Progress toward achieving an optimal weight will improve Outcome: Adequate for Discharge   Problem: Skin Integrity: Goal: Risk for impaired skin integrity will decrease Outcome: Adequate for Discharge   Problem: Tissue Perfusion: Goal: Adequacy of tissue perfusion will improve Outcome: Adequate for Discharge   Problem: Education: Goal: Knowledge of General Education information will improve Description: Including pain  rating scale, medication(s)/side effects and non-pharmacologic comfort measures 10/26/2021 1450 by Santa Lighter, RN Outcome: Adequate for Discharge 10/26/2021 1225 by Santa Lighter, RN Outcome: Progressing   Problem: Health Behavior/Discharge Planning: Goal: Ability to manage health-related needs will improve 10/26/2021 1450 by Santa Lighter, RN Outcome: Adequate for Discharge 10/26/2021 1225 by Santa Lighter, RN Outcome: Progressing

## 2021-10-26 NOTE — Progress Notes (Signed)
[facility] PROGRESS NOTE   Mandy Delacruz OZH:[id number] DOB: 1945/03/13 DOA: 10/24/2021  PCP: [name], MD  Brief History/Interval Summary: 75 year old female with a past medical history of essential hypertension, hyperlipidemia presented with intermittent dark stools with diarrhea starting about 2 weeks ago.  Also had nausea and vomiting about a week prior to admission.  Went to her primary care physician's office and was found to have a hemoglobin of 7.2.  She was sent to the emergency department.  She admits to taking ibuprofen on a daily basis for arthritis pain.  Consultants: Gastroenterology  Procedures:  EGD  Impression:               - Normal esophagus.                           - Small hiatal hernia.                           - Gastritis.                           - Normal examined duodenum.                           - No specimens collected.  Colonoscopy is planned for today.    Subjective/Interval History: Patient feels well.  Denies any further episodes of nausea vomiting.  Has been tolerating the prep for colonoscopy.  Has not noted any blood in the stool.      Assessment/Plan:  GI bleed, likely upper Presented with history suggestive of melanotic stools.  Has been taking ibuprofen on a daily basis.  She has had endoscopies previously with the last one was about 9 years ago. Patient was seen by gastroenterology.  Underwent EGD yesterday which showed gastritis.  Plan is for colonoscopy today.  Continue with twice daily PPI. CT angiogram of the abdomen did not show any acute bleeding.  Acute blood loss anemia Noted to have a hemoglobin of 6.7 at admission.  She was transfused 2 units of PRBC.  Hemoglobin responded appropriately.  Noted to be 9.3 today.    Acute pyelonephritis/E. coli CT angiogram did raise concern for pyelonephritis.  Urine culture growing E. coli.  Sensitivities noted.  Continue ceftriaxone while she is in the hospital.  Can be  transitioned to cephalexin at discharge.    Essential hypertension Holding antihypertensives.  Blood pressure is reasonably well controlled.  Diabetes mellitus type 2 without complications GEX5M 6.0.  CBGs are reasonably well controlled.   DVT Prophylaxis: SCDs Code Status: Full code Family Communication: Discussed with the patient Disposition Plan: Hopefully return home later today or tomorrow depending on results of colonoscopy.  Status is: Inpatient Remains inpatient appropriate because: Upper GI bleed      Medications: Scheduled:  insulin aspart  0-9 Units Subcutaneous Q4H   mometasone-formoterol  2 puff Inhalation BID   pantoprazole  40 mg Intravenous Q12H   pravastatin  40 mg Oral QPM   Continuous:  sodium chloride     cefTRIAXone (ROCEPHIN)  IV 1 g (10/26/21 0944)   WUX:[id number] **OR** acetaminophen, albuterol, loratadine, ondansetron **OR** ondansetron (ZOFRAN) IV  Antibiotics: Anti-infectives (From admission, onward)    Start     Dose/Rate Route Frequency Ordered Stop   10/26/21 1000  cefTRIAXone (ROCEPHIN) 1 g in sodium chloride 0.9 %  100 mL IVPB        1 g 200 mL/hr over 30 Minutes Intravenous Every 24 hours 10/25/21 0947 10/30/21 0959   10/25/21 0330  cefTRIAXone (ROCEPHIN) 2 g in sodium chloride 0.9 % 100 mL IVPB        2 g 200 mL/hr over 30 Minutes Intravenous  Once 10/25/21 0315 10/25/21 0355       Objective:  Vital Signs  Vitals:   10/25/21 2000 10/26/21 0017 10/26/21 0437 10/26/21 0751  BP: (!) 116/58 (!) 104/53 129/64 (!) 147/64  Pulse: 68 65 76 69  Resp: '18 16 20 16  '$ Temp: 98.2 F (36.8 C) 97.9 F (36.6 C) 97.6 F (36.4 C) 97.9 F (36.6 C)  TempSrc: Oral Oral Oral Oral  SpO2: 97% 97% 98% 100%  Weight:      Height:        Intake/Output Summary (Last 24 hours) at 10/26/2021 1103 Last data filed at 10/26/2021 0346 Gross per 24 hour  Intake 185.49 ml  Output --  Net 185.49 ml    Filed Weights   10/25/21 1237  Weight: 64  kg    Awake alert.  In no distress.    Lab Results:  Data Reviewed: I have personally reviewed following labs and reports of the imaging studies  CBC: Recent Labs  Lab 10/24/21 2231 10/25/21 0650 10/25/21 1609 10/26/21 0209  WBC 6.8 6.4  --  5.0  NEUTROABS 4.2  --   --   --   HGB 6.7* 8.2* 9.7* 9.3*  HCT 21.8* 25.4* 28.9* 28.1*  MCV 90.1 88.8  --  87.8  PLT 337 307  --  285     Basic Metabolic Panel: Recent Labs  Lab 10/24/21 2231 10/25/21 0650 10/26/21 0209  NA 134* 134* 134*  K 4.4 4.5 4.2  CL 101 103 104  CO2 '24 22 23  '$ GLUCOSE 104* 127* 125*  BUN '20 17 13  '$ CREATININE 1.02* 0.98 0.91  CALCIUM 9.6 9.3 9.3     GFR: Estimated Creatinine Clearance: 48.8 mL/min (by C-G formula based on SCr of 0.91 mg/dL).  Liver Function Tests: Recent Labs  Lab 10/24/21 2231  AST 22  ALT 34  ALKPHOS 101  BILITOT 0.6  PROT 6.4*  ALBUMIN 2.8*      HbA1C: Recent Labs    10/25/21 0650  HGBA1C 6.0*     CBG: Recent Labs  Lab 10/25/21 1511 10/25/21 1950 10/26/21 0016 10/26/21 0432 10/26/21 0749  GLUCAP 145* 140* 161* 118* 104*       Recent Results (from the past 240 hour(s))  Urine Culture     Status: Abnormal   Collection Time: 10/24/21 10:15 PM   Specimen: Urine, Clean Catch  Result Value Ref Range Status   Specimen Description URINE, CLEAN CATCH  Final   Special Requests   Final    NONE Performed at [facility], [place] [address] [place], [place] [address]    Culture >=100,000 COLONIES/mL ESCHERICHIA COLI (A)  Final   Report Status 10/26/2021 FINAL  Final   Organism ID, Bacteria ESCHERICHIA COLI (A)  Final      Susceptibility   Escherichia coli - MIC*    AMPICILLIN >=32 RESISTANT Resistant     CEFAZOLIN <=4 SENSITIVE Sensitive     CEFEPIME <=0.12 SENSITIVE Sensitive     CEFTRIAXONE <=0.25 SENSITIVE Sensitive     CIPROFLOXACIN <=0.25 SENSITIVE Sensitive     GENTAMICIN <=1 SENSITIVE Sensitive     IMIPENEM <=0.25 SENSITIVE  Sensitive      NITROFURANTOIN <=16 SENSITIVE Sensitive     TRIMETH/SULFA >=320 RESISTANT Resistant     AMPICILLIN/SULBACTAM >=32 RESISTANT Resistant     PIP/TAZO <=4 SENSITIVE Sensitive     * >=100,000 COLONIES/mL ESCHERICHIA COLI  Blood culture (routine x 2)     Status: None (Preliminary result)   Collection Time: 10/24/21 10:31 PM   Specimen: BLOOD  Result Value Ref Range Status   Specimen Description BLOOD RIGHT ANTECUBITAL  Final   Special Requests   Final    BOTTLES DRAWN AEROBIC AND ANAEROBIC Blood Culture adequate volume   Culture   Final    NO GROWTH 2 DAYS Performed at [facility], [place] [address] [place], [place] [address]    Report Status PENDING  Incomplete  Resp Panel by RT-PCR (Flu A&B, Covid) Anterior Nasal Swab     Status: None   Collection Time: 10/24/21 10:31 PM   Specimen: Anterior Nasal Swab  Result Value Ref Range Status   SARS Coronavirus 2 by RT PCR NEGATIVE NEGATIVE Final    Comment: (NOTE) SARS-CoV-2 target nucleic acids are NOT DETECTED.  The SARS-CoV-2 RNA is generally detectable in upper respiratory specimens during the acute phase of infection. The lowest concentration of SARS-CoV-2 viral copies this assay can detect is 138 copies/mL. A negative result does not preclude SARS-Cov-2 infection and should not be used as the sole basis for treatment or other patient management decisions. A negative result may occur with  improper specimen collection/handling, submission of specimen other than nasopharyngeal swab, presence of viral mutation(s) within the areas targeted by this assay, and inadequate number of viral copies(<138 copies/mL). A negative result must be combined with clinical observations, patient history, and epidemiological information. The expected result is Negative.  Fact Sheet for Patients:  EntrepreneurPulse.com.au  Fact Sheet for Healthcare Providers:  IncredibleEmployment.be  This test is no t yet  approved or cleared by the [place] FDA and  has been authorized for detection and/or diagnosis of SARS-CoV-2 by FDA under an Emergency Use Authorization (EUA). This EUA will remain  in effect (meaning this test can be used) for the duration of the COVID-19 declaration under Section 564(b)(1) of the Act, 21 U.S.C.section 360bbb-3(b)(1), unless the authorization is terminated  or revoked sooner.       Influenza A by PCR NEGATIVE NEGATIVE Final   Influenza B by PCR NEGATIVE NEGATIVE Final    Comment: (NOTE) The Xpert Xpress SARS-CoV-2/FLU/RSV plus assay is intended as an aid in the diagnosis of influenza from Nasopharyngeal swab specimens and should not be used as a sole basis for treatment. Nasal washings and aspirates are unacceptable for Xpert Xpress SARS-CoV-2/FLU/RSV testing.  Fact Sheet for Patients: EntrepreneurPulse.com.au  Fact Sheet for Healthcare Providers: IncredibleEmployment.be  This test is not yet approved or cleared by the [place] FDA and has been authorized for detection and/or diagnosis of SARS-CoV-2 by FDA under an Emergency Use Authorization (EUA). This EUA will remain in effect (meaning this test can be used) for the duration of the COVID-19 declaration under Section 564(b)(1) of the Act, 21 U.S.C. section 360bbb-3(b)(1), unless the authorization is terminated or revoked.  Performed at [facility], [place] [address], [place], [place] [address]   Blood culture (routine x 2)     Status: None (Preliminary result)   Collection Time: 10/25/21  7:51 PM   Specimen: BLOOD  Result Value Ref Range Status   Specimen Description BLOOD SITE NOT SPECIFIED  Final  Special Requests   Final    BOTTLES DRAWN AEROBIC AND ANAEROBIC Blood Culture adequate volume   Culture   Final    NO GROWTH < 24 HOURS Performed at [facility], [place] [address], [place], [place] [address]    Report Status PENDING  Incomplete       Radiology Studies: CT ANGIO GI BLEED  Result Date: 10/25/2021 CLINICAL DATA:  Lower GI bleed EXAM: CTA ABDOMEN AND PELVIS WITHOUT AND WITH CONTRAST TECHNIQUE: Multidetector CT imaging of the abdomen and pelvis was performed using the standard protocol during bolus administration of intravenous contrast. Multiplanar reconstructed images and MIPs were obtained and reviewed to evaluate the vascular anatomy. RADIATION DOSE REDUCTION: This exam was performed according to the departmental dose-optimization program which includes automated exposure control, adjustment of the mA and/or kV according to patient size and/or use of iterative reconstruction technique. CONTRAST:  137m OMNIPAQUE IOHEXOL 350 MG/ML SOLN COMPARISON:  None Available. FINDINGS: VASCULAR Aorta: Aortic atherosclerosis.  No dissection or aneurysm. Celiac: Patent without evidence of aneurysm, dissection, vasculitis or significant stenosis. SMA: Patent without evidence of aneurysm, dissection, vasculitis or significant stenosis. Renals: Both renal arteries are patent without evidence of aneurysm, dissection, vasculitis, fibromuscular dysplasia or significant stenosis. IMA: Severe stenosis at the IMA origin, but otherwise normal. Inflow: Patent without evidence of aneurysm, dissection, vasculitis or significant stenosis. Proximal Outflow: Bilateral common femoral and visualized portions of the superficial and profunda femoral arteries are patent without evidence of aneurysm, dissection, vasculitis or significant stenosis. Veins: No obvious venous abnormality within the limitations of this arterial phase study. Review of the MIP images confirms the above findings. NON-VASCULAR Lower chest: No acute abnormality. Hepatobiliary: No focal liver abnormality is seen. Status post cholecystectomy. No biliary dilatation. Enhancing focus in the left hepatic lobe is likely a hemangioma. Pancreas: Unremarkable. No pancreatic ductal dilatation or surrounding  inflammatory changes. Spleen: Normal in size without focal abnormality. Adrenals/Urinary Tract: There is mild inhomogeneity of the parenchymal contrast enhancement pattern of the right kidney. Stomach/Bowel: No gastrointestinal hemorrhage identified. No small bowel obstruction or focal colonic abnormality. Lymphatic: No lymphadenopathy Reproductive: Uterus and bilateral adnexa are unremarkable. Other: Negative Musculoskeletal: Negative IMPRESSION: 1. No gastrointestinal hemorrhage identified. 2. Mild inhomogeneity of the parenchymal contrast enhancement pattern of the right kidney. Correlate with urinalysis to exclude pyelonephritis. Aortic Atherosclerosis (ICD10-I70.0). Electronically Signed   By: KUlyses JarredM.D.   On: 10/25/2021 03:29       LOS: 1 day   [facility] Pager on [email or web address]  10/26/2021, 11:03 AM

## 2021-10-26 NOTE — Progress Notes (Signed)
Nursing dc note  Patient alert and oriented verbalized understanding of dc instructions. All belongings given to patient.

## 2021-10-26 NOTE — Op Note (Signed)
[facility] Patient Name: Mandy Delacruz Procedure Date : 10/26/2021 MRN: [id number] Attending MD: Estill Cotta. Danis , MD Date of Birth: 1945/09/12 CSN: [id number] Age: 76 Admit Type: Inpatient Procedure:                Colonoscopy Indications:              Heme positive stool, Iron deficiency anemia                            secondary to chronic blood loss                           "dark stool", ? melena, weeks ago - Hgb 6, ferritin                            EGD yesterday with minor erosive findings Providers:                Mallie Mussel L. Loletha Carrow, MD, Carlyn Reichert, RN, Luan Moore, Technician, Brien Mates, RNFA Referring MD:             [facility] Medicines:                Monitored Anesthesia Care Complications:            No immediate complications. Estimated Blood Loss:     Estimated blood loss: none. Procedure:                Pre-Anesthesia Assessment:                           - Prior to the procedure, a History and Physical                            was performed, and patient medications and                            allergies were reviewed. The patient's tolerance of                            previous anesthesia was also reviewed. The risks                            and benefits of the procedure and the sedation                            options and risks were discussed with the patient.                            All questions were answered, and informed consent                            was obtained. Prior Anticoagulants: The patient has  taken no previous anticoagulant or antiplatelet                            agents. ASA Grade Assessment: III - A patient with                            severe systemic disease. After reviewing the risks                            and benefits, the patient was deemed in                            satisfactory condition to undergo the procedure.                            After obtaining informed consent, the colonoscope                            was passed under direct vision. Throughout the                            procedure, the patient's blood pressure, pulse, and                            oxygen saturations were monitored continuously. The                            [id number]-HQ190L ([id number] Olympus coloscope was                            introduced through the anus and advanced to the the                            cecum, identified by appendiceal orifice and                            ileocecal valve. The colonoscopy was performed with                            difficulty due to multiple diverticula in the                            colon, a redundant colon and significant looping.                            Successful completion of the procedure was aided by                            using repositioning, manual pressure and                            straightening and shortening the scope to obtain  bowel loop reduction. The quality of the bowel                            preparation was excellent. The ileocecal valve,                            appendiceal orifice, and rectum were photographed. Scope In: 12:28:29 PM Scope Out: 12:50:35 PM Scope Withdrawal Time: 0 hours 8 minutes 55 seconds  Total Procedure Duration: 0 hours 22 minutes 6 seconds  Findings:      The perianal and digital rectal examinations were normal.      Many diverticula were found in the entire colon.      The colon (entire examined portion) was significantly redundant.      The exam was otherwise without abnormality on direct and retroflexion       views. Impression:               - Diverticulosis in the entire examined colon.                           - Redundant colon.                           - The examination was otherwise normal on direct                            and retroflexion views.                           - No specimens  collected. Recommendation:           - Return patient to hospital ward for ongoing care.                           - Resume regular diet.                           - Patient can be discharged home any time from a GI                            perspective after receiving a dose of IV iron.                           Home on oral iron (will turn stool dark)                           We will arrange outpatient small bowel video                            capsule study for what appears to be chronic small                            bowel GI blood loss. Procedure Code(s):        --- Professional ---  45378, Colonoscopy, flexible; diagnostic, including                            collection of specimen(s) by brushing or washing,                            when performed (separate procedure) Diagnosis Code(s):        --- Professional ---                           R19.5, Other fecal abnormalities                           D50.0, Iron deficiency anemia secondary to blood                            loss (chronic)                           K57.30, Diverticulosis of large intestine without                            perforation or abscess without bleeding                           Q43.8, Other specified congenital malformations of                            intestine CPT copyright 2019 American Medical Association. All rights reserved. The codes documented in this report are preliminary and upon coder review may  be revised to meet current compliance requirements. William Schake L. Loletha Carrow, MD 10/26/2021 1:07:36 PM This report has been signed electronically. Number of Addenda: 0

## 2021-10-29 ENCOUNTER — Encounter (HOSPITAL_COMMUNITY): Payer: Self-pay | Admitting: Internal Medicine

## 2021-10-29 LAB — CULTURE, BLOOD (ROUTINE X 2)
Culture: NO GROWTH
Special Requests: ADEQUATE

## 2021-10-30 LAB — CULTURE, BLOOD (ROUTINE X 2)
Culture: NO GROWTH
Special Requests: ADEQUATE

## 2021-10-31 DIAGNOSIS — E1122 Type 2 diabetes mellitus with diabetic chronic kidney disease: Secondary | ICD-10-CM | POA: Diagnosis not present

## 2021-10-31 DIAGNOSIS — N390 Urinary tract infection, site not specified: Secondary | ICD-10-CM | POA: Diagnosis not present

## 2021-10-31 DIAGNOSIS — I350 Nonrheumatic aortic (valve) stenosis: Secondary | ICD-10-CM | POA: Diagnosis not present

## 2021-10-31 DIAGNOSIS — D649 Anemia, unspecified: Secondary | ICD-10-CM | POA: Diagnosis not present

## 2021-10-31 DIAGNOSIS — E7800 Pure hypercholesterolemia, unspecified: Secondary | ICD-10-CM | POA: Diagnosis not present

## 2021-11-08 ENCOUNTER — Ambulatory Visit: Payer: BC Managed Care – PPO | Attending: Cardiovascular Disease | Admitting: Cardiovascular Disease

## 2021-11-08 ENCOUNTER — Encounter: Payer: Self-pay | Admitting: Cardiovascular Disease

## 2021-11-08 VITALS — BP 136/64 | HR 78 | Ht 65.0 in | Wt 137.0 lb

## 2021-11-08 DIAGNOSIS — E782 Mixed hyperlipidemia: Secondary | ICD-10-CM

## 2021-11-08 DIAGNOSIS — I10 Essential (primary) hypertension: Secondary | ICD-10-CM | POA: Diagnosis not present

## 2021-11-08 DIAGNOSIS — I2089 Other forms of angina pectoris: Secondary | ICD-10-CM | POA: Diagnosis not present

## 2021-11-08 DIAGNOSIS — I700 Atherosclerosis of aorta: Secondary | ICD-10-CM

## 2021-11-08 DIAGNOSIS — I350 Nonrheumatic aortic (valve) stenosis: Secondary | ICD-10-CM

## 2021-11-08 DIAGNOSIS — J449 Chronic obstructive pulmonary disease, unspecified: Secondary | ICD-10-CM

## 2021-11-08 DIAGNOSIS — E119 Type 2 diabetes mellitus without complications: Secondary | ICD-10-CM

## 2021-11-08 DIAGNOSIS — I440 Atrioventricular block, first degree: Secondary | ICD-10-CM

## 2021-11-08 NOTE — Patient Instructions (Signed)
Medication Instructions:  No changes *If you need a refill on your cardiac medications before your next appointment, please call your pharmacy*   Lab Work: None ordered If you have labs (blood work) drawn today and your tests are completely normal, you will receive your results only by: [place] (if you have MyChart) OR A paper copy in the mail If you have any lab test that is abnormal or we need to change your treatment, we will call you to review the results.   Testing/Procedures: Your physician has requested that you have an echocardiogram. Echocardiography is a painless test that uses sound waves to create images of your heart. It provides your doctor with information about the size and shape of your heart and how well your heart's chambers and valves are working. You may receive an ultrasound enhancing agent through an IV if needed to better visualize your heart during the echo.This procedure takes approximately one hour. There are no restrictions for this procedure. This will take place at the [address].     Follow-Up: At [facility], you and your health needs are our priority.  As part of our continuing mission to provide you with exceptional heart care, we have created designated Provider Care Teams.  These Care Teams include your primary Cardiologist (physician) and Advanced Practice Providers (APPs -  Physician Assistants and Nurse Practitioners) who all work together to provide you with the care you need, when you need it.  We recommend signing up for the patient portal called "MyChart".  Sign up information is provided on this After Visit Summary.  MyChart is used to connect with patients for Virtual Visits (Telemedicine).  Patients are able to view lab/test results, encounter notes, upcoming appointments, etc.  Non-urgent messages can be sent to your provider as well.   To learn more about what you can do with MyChart, go to  [email or web address].    Your next appointment:   6 month(s)  The format for your next appointment:   In Person  Provider:   Dr. Sallyanne Kuster

## 2021-11-08 NOTE — Progress Notes (Signed)
Cardiology Office Consultation Note:    Date:  11/08/2021   ID:  Mandy Delacruz, Mandy Delacruz January 20, 1945, MRN [id number]  PCP:  [name], [place]  Cardiologist:  [name], MD NEW Advanced Practice Provider:  No care team member to display Electrophysiologist:  None       Referring MD: [name], MD   Chief Complaint  Patient presents with   Aortic Stenosis     History of Present Illness:    Mandy Delacruz is a 76 y.o. female with a long history of smoking and COPD, hypertension, hiatal hernia and GERD, DM, discovered to have aortic stenosis after undergoing CT of the chest.  Her mother Mandy Delacruz was also my patient, passed away a few years ago.  She was recently hospitalized on 10/24/2021 with anemia and melena (hemoglobin as low as 6.7), which led to exertional dyspnea and exertional chest tightness.  After receiving a 2-unit PRBC blood transfusion the dyspnea and chest discomfort have resolved.  Labs confirmed iron deficiency.  Her hemoglobin is now up to 11.5 on iron supplements.  She underwent upper and lower endoscopy that did not clearly show the source of the bleeding.  She had been taking ibuprofen "every single day" for a couple of years.  EGD showed a small hiatal hernia and gastritis, colonoscopy showed diverticulosis, no active bleeding.  She has a follow-up visit with Dr. Loletha Carrow on November 3.  Even in years past she did have occasional chest tightness when walking outside in cold weather, with symptoms that promptly resolved when she stopped to rest.  Has had chronic shortness of breath, NYHA functional class II which she has attributed to COPD (45 pack year history of smoking, quit a couple of years ago).  She does have occasional wheezing.  She does not have orthopnea, PND, lower extremity edema, intermittent claudication or symptoms of stroke/TIA.    Most recent hemoglobin A1c was 6.0% during her recent hospitalization.  Most recent  LDL cholesterol 72, HDL 52).  She has normal renal function with a creatinine of 0.94.  She was being evaluated by Dr. Roxan Hockey for lung nodules that improved after antibiotics, suggesting they were inflammatory rather than malignant.  During the evaluation, an echocardiogram was performed which showed that she has moderate aortic stenosis (mean gradient 29 mmHg, aortic valve area 1.3 cm, dimensionless index 0.42), normal left ventricular systolic function with moderate left ventricular hypertrophy.  A previous echo from 2019 also described aortic stenosis, but appeared to underestimate the aortic valve area.  The mean aortic gradient was not calculated.  The peak velocity has increased from 2.57 m/s in 2019 to 2.71 m/s on the echo from December 2021  Past Medical History:  Diagnosis Date   Aortic stenosis    Asthma    Bronchitis    on bactrin since 12-27-14   Colon polyps 2001   Diverticulosis of colon (without mention of hemorrhage) 2001   Dizziness    inner ear problem at times, saw dr  [name] for   GERD (gastroesophageal reflux disease)    Heart murmur    Hiatal hernia 2001   Hyperlipidemia    Hypertension    IH (inguinal hernia)    left   Lung nodule    Other esophagitis 2001   Tobacco abuse     Past Surgical History:  Procedure Laterality Date   breast biospy     left, benign   CHOLECYSTECTOMY  COLONOSCOPY     COLONOSCOPY WITH PROPOFOL N/A 10/26/2021   Procedure: COLONOSCOPY WITH PROPOFOL;  Surgeon: Doran Stabler, MD;  Location: [place];  Service: Gastroenterology;  Laterality: N/A;   ENDOVENOUS ABLATION SAPHENOUS VEIN W/ LASER Left 04/29/2016   endovenous laser ablation L SSV and stab phlebectomy left leg by Tinnie Gens MD    ESOPHAGOGASTRODUODENOSCOPY (EGD) WITH PROPOFOL N/A 10/25/2021   Procedure: ESOPHAGOGASTRODUODENOSCOPY (EGD) WITH PROPOFOL;  Surgeon: Sharyn Creamer, MD;  Location: [facility] END[facility]COPY;  Service: Gastroenterology;  Laterality: N/A;    INGUINAL HERNIA REPAIR  oct 2015   left   INGUINAL HERNIA REPAIR Left 01/10/2015   Procedure: OPEN REPAIR OF RECURRENT LEFT INGUINAL HERNIA ;  Surgeon: Greer Pickerel, MD;  Location: [facility];  Service: General;  Laterality: Left;   INSERTION OF MESH Left 01/10/2015   Procedure: INSERTION OF MESH;  Surgeon: Greer Pickerel, MD;  Location: [facility];  Service: General;  Laterality: Left;   INSERTION OF MESH N/A 08/18/2017   Procedure: INSERTION OF MESH;  Surgeon: Greer Pickerel, MD;  Location: [place];  Service: General;  Laterality: N/A;   LAPAROSCOPY N/A 01/10/2015   Procedure: LAPAROSCOPY DIAGNOSTIC;  Surgeon: Greer Pickerel, MD;  Location: [facility];  Service: General;  Laterality: N/A;   ORIF HIP FRACTURE  2006   3 pins, right   UMBILICAL HERNIA REPAIR N/A 08/18/2017   Procedure: LAPAROSCOPIC-ASSISTED UMBILICAL HERNIA  REPAIR ERAS PATHWAY;  Surgeon: Greer Pickerel, MD;  Location: [place];  Service: General;  Laterality: N/A;    Current Medications: Current Meds  Medication Sig   Albuterol Sulfate (PROAIR HFA IN) Inhale 2 puffs into the lungs 4 (four) times daily as needed (wheezing).    diphenhydrAMINE (BENADRYL) 25 MG tablet Take 25 mg by mouth at bedtime.   ferrous sulfate 325 (65 FE) MG tablet Take 1 tablet (325 mg total) by mouth daily.   fexofenadine (ALLEGRA) 180 MG tablet Take 180 mg by mouth daily as needed for allergies or rhinitis.   losartan-hydrochlorothiazide (HYZAAR) 50-12.5 MG tablet Take 1/2 tablet by mouth once daily   pantoprazole (PROTONIX) 40 MG tablet Take 1 tablet (40 mg total) by mouth daily.   pravastatin (PRAVACHOL) 40 MG tablet TAKE 1 TABLET(40 MG) BY MOUTH EVERY EVENING (Patient taking differently: Take 40 mg by mouth every evening.)   SYMBICORT 80-4.5 MCG/ACT inhaler Take 2 puffs by mouth 2 (two) times daily.     Allergies:   Doxycycline hyclate, Ace inhibitors, Hydrocodone bit-homatrop mbr, and Codeine   Social History   Socioeconomic History   Marital status: Married     Spouse name: Not on file   Number of children: 2   Years of education: Not on file   Highest education level: Not on file  Occupational History   Occupation: part-time office asst.  Tobacco Use   Smoking status: Former    Years: 45.00    Types: Cigarettes   Smokeless tobacco: Never   Tobacco comments:    maybe 5-10 cigarettes a day  starts and stops smoking every few days  Vaping Use   Vaping Use: Never used  Substance and Sexual Activity   Alcohol use: Yes    Alcohol/week: 0.0 - 1.0 standard drinks of alcohol    Comment: social   Drug use: No   Sexual activity: Not on file  Other Topics Concern   Not on file  Social History Narrative   Not on file   Social Determinants of Health   Financial Resource  Strain: Not on file  Food Insecurity: Not on file  Transportation Needs: Not on file  Physical Activity: Not on file  Stress: Not on file  Social Connections: Not on file     Family History: The patient's family history includes COPD in her mother; Celiac disease in an other family member; Hypertension in her father. There is no history of Colon cancer, Esophageal cancer, Rectal cancer, or Stomach cancer.  ROS:   Please see the history of present illness.     All other systems reviewed and are negative.  EKGs/Labs/Other Studies Reviewed:    The following studies were reviewed today:  Notes from Dr. Merilynn Finland, several CT exams of the chest  Echocardiogram 12/26/2019    1. Left ventricular ejection fraction, by estimation, is 60 to 65%. The  left ventricle has normal function. The left ventricle has no regional  wall motion abnormalities. There is moderate left ventricular hypertrophy.  Left ventricular diastolic  parameters are consistent with Grade I diastolic dysfunction (impaired  relaxation).   2. Right ventricular systolic function is normal. The right ventricular  size is normal.   3. The mitral valve is normal in structure. No evidence of mitral  valve  regurgitation. No evidence of mitral stenosis. Moderate mitral annular  calcification.   4. The aortic valve is calcified. There is moderate calcification of the  aortic valve. There is moderate thickening of the aortic valve. Aortic  valve regurgitation is not visualized. Moderate aortic valve stenosis.  Aortic valve area, by VTI measures  1.32 cm. Aortic valve mean gradient measures 29.3 mmHg. Aortic valve Vmax  measures 3.71 m/s.   5. The inferior vena cava is normal in size with greater than 50%  respiratory variability, suggesting right atrial pressure of 3 mmHg.    AV Area (VTI):     1.32 cm  AV Vmax:           371.33 cm/s  AV Peak Grad:      55.2 mmHg  AV Mean Grad:      29.3 mmHg  LVOT/AV VTI ratio: 0.42   EKG:  EKG is ordered today.  Similar to previous tracings.  Shows sinus rhythm with first-degree AV block (PR interval 220 ms) and fairly mild T wave inversion leads I and aVL, otherwise normal repolarization, QTc 401 ms which  Recent Labs: 10/24/2021: ALT 34 10/26/2021: BUN 13; Creatinine, Ser 0.91; Hemoglobin 9.3; Platelets 285; Potassium 4.2; Sodium 134  11/28/2019 Hemoglobin A1c 6.7%, was 146, creatinine 0.96, TSH 1.16 10/25/2021  Hemoglobin A1c 6.0% 11/01/2018. Creatinine 0.94, potassium 4.9, TSH 1.65  Recent Lipid Panel No results found for: "CHOL", "TRIG", "HDL", "CHOLHDL", "VLDL", "LDLCALC", "LDLDIRECT" 11/28/2019 Cholesterol 198, HDL 42, LDL 127, triglycerides 170 10/31/2021 Cholesterol 146, HDL 52, LDL 72, triglycerides 122   Risk Assessment/Calculations:       Physical Exam:    VS:  BP 136/64 (BP Location: Left Arm, Patient Position: Sitting, Cuff Size: Normal)   Pulse 78   Ht '5\' 5"'$  (1.651 m)   Wt 62.1 kg   SpO2 97%   BMI 22.80 kg/m     Wt Readings from Last 3 Encounters:  11/08/21 62.1 kg  10/26/21 64 kg  02/19/21 65.3 kg     General: Alert, oriented x3, no distress, appears lean and fit Head: no evidence of trauma, PERRL,  EOMI, no exophtalmos or lid lag, no myxedema, no xanthelasma; normal ears, nose and oropharynx Neck: normal jugular venous pulsations and no hepatojugular reflux; brisk carotid  pulses without delay and bilateral carotid bruits (L>R) Chest: very few wheezes, otherwise clear to auscultation, no signs of consolidation by percussion or palpation, normal fremitus, symmetrical and full respiratory excursions Cardiovascular: normal position and quality of the apical impulse, regular rhythm, normal first and very muffled quasi-absent second heart sound, mid-peaking 3/6 aortic ejection murmur, no diastolic murmurs, rubs or gallops Abdomen: no tenderness or distention, no masses by palpation, no abnormal pulsatility or arterial bruits, normal bowel sounds, no hepatosplenomegaly Extremities: no clubbing, cyanosis or edema; 2+ radial, ulnar and brachial pulses bilaterally; 2+ right femoral, posterior tibial and dorsalis pedis pulses; 2+ left femoral, posterior tibial and dorsalis pedis pulses; no subclavian or femoral bruits Neurological: grossly nonfocal Psych: Normal mood and affect   ASSESSMENT:    1. Nonrheumatic aortic valve stenosis   2. Exertional angina   3. Atherosclerosis of aorta ([place])   4. Essential hypertension   5. Mixed hyperlipidemia   6. Type 2 diabetes mellitus without complication, without long-term current use of insulin (HCC)   7. First degree AV block   8. Chronic obstructive pulmonary disease, unspecified COPD type ([place])     PLAN:    In order of problems listed above:  AS: physical exam suggests severe calcific aortic stenosis (nearly absent S2), and she had exertional angina and dyspnea when anemic. Some of the symptoms were already present (also can be attributed to COPD and probable underlying CAD, respectively), but she is now almost symptom free after correction of the anemia.  Recommended that she call if there is any change in the pattern of her complaints.  We will check  her echocardiogram, suspect will trigger workup for AVR.  Reviewed options for treatment with SAVR versus TAVR, the latter probably being the better choice in view of her age and pulmonary disease. Exertional angina: Fairly typical description of exertional angina with a lower threshold when she was anemic in the past when she was walking in cold weather.  Currently asymptomatic.  Would like to avoid beta-blockers in view of her lung disorder.   She will eventually require coronary angiography as work-up for expected TAVR.  In the meantime, pay attention to the pattern of symptoms and call if she develops accelerated angina.  Should seek urgent medical attention for symptoms of last more than 30 minutes, not relieved by rest.   Aortic atherosclerosis: Documented on CT of the chest.  Also suspect that she has CAD.   Lipid parameters close to target. Currently not on aspirin due to suspected GI bleeding. HTN: Well-controlled. HLP: Satisfactory lipid parameters on current medications. DM: Glycemic control has improved symptoms with attention to diet, without medications.  Most recent hemoglobin A1c down from 6.7% to 6.0%. First-degree AV block: No signs or symptoms of high-grade AV conduction abnormalities, may be at high risk for pacemaker with TAVR in the future. COPD: Has quit smoking.  Functional class II exertional dyspnea.  Symptoms fairly well controlled with inhalers.  However of wheezing today.        Medication Adjustments/Labs and Tests Ordered: Current medicines are reviewed at length with the patient today.  Concerns regarding medicines are outlined above.  Orders Placed This Encounter  Procedures   EKG 12-Lead   ECHOCARDIOGRAM COMPLETE   No orders of the defined types were placed in this encounter.   Patient Instructions  Medication Instructions:  No changes *If you need a refill on your cardiac medications before your next appointment, please call your pharmacy*   Lab  Work:  None ordered If you have labs (blood work) drawn today and your tests are completely normal, you will receive your results only by: [place] (if you have MyChart) OR A paper copy in the mail If you have any lab test that is abnormal or we need to change your treatment, we will call you to review the results.   Testing/Procedures: Your physician has requested that you have an echocardiogram. Echocardiography is a painless test that uses sound waves to create images of your heart. It provides your doctor with information about the size and shape of your heart and how well your heart's chambers and valves are working. You may receive an ultrasound enhancing agent through an IV if needed to better visualize your heart during the echo.This procedure takes approximately one hour. There are no restrictions for this procedure. This will take place at the [address].     Follow-Up: At [facility], you and your health needs are our priority.  As part of our continuing mission to provide you with exceptional heart care, we have created designated Provider Care Teams.  These Care Teams include your primary Cardiologist (physician) and Advanced Practice Providers (APPs -  Physician Assistants and Nurse Practitioners) who all work together to provide you with the care you need, when you need it.  We recommend signing up for the patient portal called "MyChart".  Sign up information is provided on this After Visit Summary.  MyChart is used to connect with patients for Virtual Visits (Telemedicine).  Patients are able to view lab/test results, encounter notes, upcoming appointments, etc.  Non-urgent messages can be sent to your provider as well.   To learn more about what you can do with MyChart, go to [email or web address].    Your next appointment:   6 month(s)  The format for your next appointment:   In Person  Provider:   Dr. Sallyanne Kuster     Signed, [name], MD  11/08/2021 11:00 AM    [place]

## 2021-11-15 ENCOUNTER — Other Ambulatory Visit: Payer: BC Managed Care – PPO

## 2021-11-15 ENCOUNTER — Encounter: Payer: Self-pay | Admitting: Gastroenterology

## 2021-11-15 ENCOUNTER — Ambulatory Visit (INDEPENDENT_AMBULATORY_CARE_PROVIDER_SITE_OTHER): Payer: BC Managed Care – PPO | Admitting: Gastroenterology

## 2021-11-15 VITALS — BP 150/70 | HR 76 | Ht 63.0 in | Wt 136.1 lb

## 2021-11-15 DIAGNOSIS — D509 Iron deficiency anemia, unspecified: Secondary | ICD-10-CM | POA: Diagnosis not present

## 2021-11-15 DIAGNOSIS — R195 Other fecal abnormalities: Secondary | ICD-10-CM

## 2021-11-15 NOTE — Progress Notes (Signed)
[place] [facility] Progress Note  Chief Complaint: Anemia of chronic GI blood loss and melena  Subjective  History: 76 year old woman recently hospitalized for GI bleeding ("dark stool") and iron deficiency anemia, seen in consultation by Dr. Lorenso Courier (previously Dr. Olevia Perches patient).  Regular NSAID use. EGD by Dr. Lorenso Courier 10/25/2021 with no source of anemia or bleeding.  Mild gastritis seen, H. pylori antibody planned according to the report, though no result found. Colonoscopy by me (weekend coverage) 10/26/2021 with no bleeding source.  Plan for iron supplementation and outpatient video capsule study.  Has been taking FeSO4 325 mg once daily since then.   Saw PCP the week after discharge - says Hgb was 11.5, and they plan to check In late January.  Family denies black or bloody stool, digestive upset or new symptoms since hospital discharge.  For years she has been bothered by constipation, and typically has a BM every 2 or 3 days.  The iron tablets do not seem to have worsened it.  ROS: Cardiovascular:  no chest pain Respiratory: no dyspnea Intermittent asthmatic symptoms of dyspnea or wheezing. Remainder systems negative except as above The patient's Past Medical, Family and Social History were reviewed and are on file in the EMR. Past Medical History:  Diagnosis Date   Anemia    Aortic stenosis    Asthma    Bronchitis    on bactrin since 12-27-14   Colon polyps 2001   Diverticulosis of colon (without mention of hemorrhage) 2001   Dizziness    inner ear problem at times, saw dr  [name] for   GERD (gastroesophageal reflux disease)    GI bleed    Heart murmur    Hiatal hernia 2001   Hyperlipidemia    Hypertension    IH (inguinal hernia)    left   Lung nodule    Other esophagitis 2001   Tobacco abuse     Objective:  Med list reviewed  Current Outpatient Medications:    acetaminophen (TYLENOL) 500 MG tablet, Take 1,000 mg by mouth as needed., Disp: , Rfl:     Albuterol Sulfate (PROAIR HFA IN), Inhale 2 puffs into the lungs 4 (four) times daily as needed (wheezing). , Disp: , Rfl:    diphenhydrAMINE (BENADRYL) 25 MG tablet, Take 25 mg by mouth at bedtime., Disp: , Rfl:    ferrous sulfate 325 (65 FE) MG tablet, Take 1 tablet (325 mg total) by mouth daily., Disp: 30 tablet, Rfl: 3   fexofenadine (ALLEGRA) 180 MG tablet, Take 180 mg by mouth daily as needed for allergies or rhinitis., Disp: , Rfl:    losartan-hydrochlorothiazide (HYZAAR) 50-12.5 MG tablet, Take 1/2 tablet by mouth once daily, Disp: , Rfl: 0   pantoprazole (PROTONIX) 40 MG tablet, Take 1 tablet (40 mg total) by mouth daily., Disp: 30 tablet, Rfl: 2   pravastatin (PRAVACHOL) 40 MG tablet, TAKE 1 TABLET(40 MG) BY MOUTH EVERY EVENING (Patient taking differently: Take 40 mg by mouth every evening.), Disp: 90 tablet, Rfl: 0   SYMBICORT 80-4.5 MCG/ACT inhaler, Take 2 puffs by mouth 2 (two) times daily., Disp: , Rfl: 11   Vital signs in last 24 hrs: Vitals:   11/15/21 0922  BP: (!) 150/70  Pulse: 76   Wt Readings from Last 3 Encounters:  11/15/21 136 lb 2 oz (61.7 kg)  11/08/21 137 lb (62.1 kg)  10/26/21 141 lb (64 kg)    Physical Exam  Well-appearing, thin as before HEENT: sclera anicteric, oral  mucosa moist without lesions Neck: supple, no thyromegaly, JVD or lymphadenopathy Cardiac: Prominent systolic murmur (AS),  no peripheral edema Pulm: clear to auscultation bilaterally, normal RR and effort noted Abdomen: soft, no tenderness, with active bowel sounds. No guarding or palpable hepatosplenomegaly. Skin; warm and dry, no jaundice or rash  Labs:     Latest Ref Rng & Units 10/26/2021    2:09 AM 10/25/2021    4:09 PM 10/25/2021    6:50 AM  CBC  WBC 4.0 - 10.5 K/uL 5.0   6.4   Hemoglobin 12.0 - 15.0 g/dL 9.3  9.7  8.2   Hematocrit 36.0 - 46.0 % 28.1  28.9  25.4   Platelets 150 - 400 K/uL 285   307    Iron/TIBC/Ferritin/ %Sat    Component Value Date/Time   IRON 64  10/25/2021 0650   TIBC 370 10/25/2021 0650   FERRITIN 5 (L) 10/25/2021 0650   IRONPCTSAT 17 10/25/2021 0650    ___________________________________________ Radiologic studies:   ____________________________________________ Other:   _____________________________________________ Assessment & Plan  Assessment: Encounter Diagnoses  Name Primary?   Iron deficiency anemia, unspecified iron deficiency anemia type Yes   Heme positive stool     IDA that appears to be from occult GI blood loss with positive FOBT on hospital admission.  No convincing source on EGD or colonoscopy, so I recommended small bowel video capsule study.  Procedure was described along with approximate 1% risk of capsule retention. She was agreeable and it was scheduled.  (Suspect either NSAID related ulcers and or AVMs if there are any findings).  She has decreased her use of ibuprofen lately.  She also indicates a niece was diagnosed with celiac sprue when she was found to be anemic.  Leila asked if this could be the case for her, and I agree she should have antibody testing.  No duodenal biopsies were taken during the inpatient EGD. Also, the mild gastritis seen was likely NSAID related, but she should be ruled out for H. pylori. Plan: VCE Stool for H pylori Celiac Ab PCP check CBC and dose iron -I will copy my note to her primary care provider and recommend a check of blood counts by mid December so her iron can be dosed accordingly.  [name]  CC: [name] [facility])

## 2021-11-15 NOTE — Patient Instructions (Addendum)
_______________________________________________________  If you are age 75 or older, your body mass index should be between 23-30. Your Body mass index is 24.11 kg/m. If this is out of the aforementioned range listed, please consider follow up with your Primary Care Provider.  If you are age 75 or younger, your body mass index should be between 19-25. Your Body mass index is 24.11 kg/m. If this is out of the aformentioned range listed, please consider follow up with your Primary Care Provider.   _______________________________________________________    The [place] [facility] providers would like to encourage you to use [facility] to communicate with providers for non-urgent requests or questions.  Due to long hold times on the telephone, sending your provider a message by [facility] may be a faster and more efficient way to get a response.  Please allow 48 business hours for a response.  Please remember that this is for non-urgent requests.  _______________________________________________________  Your provider has requested that you go to the basement level for lab work before leaving today. Press "B" on the elevator. The lab is located at the first door on the left as you exit the elevator.  Due to recent changes in healthcare laws, you may see the results of your imaging and laboratory studies on MyChart before your provider has had a chance to review them.  We understand that in some cases there may be results that are confusing or concerning to you. Not all laboratory results come back in the same time frame and the provider may be waiting for multiple results in order to interpret others.  Please give [place] 48 hours in order for your provider to thoroughly review all the results before contacting the office for clarification of your results.   Your provider has ordered "Diatherix" stool testing for you. You have received a kit from our office today containing all necessary supplies to complete this test.  Please carefully read the stool collection instructions provided in the kit before opening the accompanying materials. In addition, be sure to place the label from the top left corner of the laboratory request sheet onto the "puritan opti-swab" tube that is supplied in the kit. This label should include your full name and date of birth. After completing the test, you should secure the purtian tube into the specimen biohazard bag. The laboratory request information sheet (including date and time of specimen collection) should be placed into the outside pocket of the specimen biohazard bag and returned to the [facility] lab with 2 days of collection.    It was a pleasure to see you today!  Thank you for trusting me with your gastrointestinal care!

## 2021-11-16 LAB — TISSUE TRANSGLUTAMINASE, IGA: (tTG) Ab, IgA: <1 U/mL

## 2021-11-16 LAB — IGA: Immunoglobulin A: 324 mg/dL — ABNORMAL HIGH (ref 70–320)

## 2021-11-22 ENCOUNTER — Telehealth: Payer: Self-pay | Admitting: Gastroenterology

## 2021-11-22 NOTE — Telephone Encounter (Signed)
We have moved her capsule date to 12-03-2021

## 2021-11-22 NOTE — Telephone Encounter (Signed)
Patient called stating she thinks she has messed up taking her iron supplement and is wondering how to move forward. Please advise

## 2021-11-22 NOTE — Telephone Encounter (Signed)
Patient is scheduled for a capsule on 11-26-2021, should we reschedule

## 2021-11-27 ENCOUNTER — Telehealth: Payer: Self-pay | Admitting: Gastroenterology

## 2021-11-27 NOTE — Telephone Encounter (Signed)
Pt called and wanted to know if it was ok for her to continue taking her protonix or if she needed to stop if for the VCE. Discussed with her that she can continue to protonix. Pt verbalized understanding.

## 2021-11-27 NOTE — Telephone Encounter (Signed)
Inbound call from patient stating that she is scheduled to have a capsule endoscopy on 11/21 and has questions she would like to discuss with the nurse. Please advise.

## 2021-12-03 ENCOUNTER — Telehealth: Payer: Self-pay | Admitting: Gastroenterology

## 2021-12-03 ENCOUNTER — Ambulatory Visit (INDEPENDENT_AMBULATORY_CARE_PROVIDER_SITE_OTHER): Payer: BC Managed Care – PPO | Admitting: Gastroenterology

## 2021-12-03 DIAGNOSIS — D500 Iron deficiency anemia secondary to blood loss (chronic): Secondary | ICD-10-CM | POA: Diagnosis not present

## 2021-12-03 NOTE — Telephone Encounter (Signed)
Discussed with pt that she does still need to do the diatherix test, she is aware.

## 2021-12-03 NOTE — Progress Notes (Signed)
Capsule id: [id number] Exp: 01/18/2023 LOT: [id number]  Patient arrived for VCE. Reported the prep went well. This RN explained capsule dietary restrictions for the next few hours. Pt advised to return at 4 pm to return capsule equipment.  Patient verbalized understanding. Opened capsule, ensured capsule was flashing prior to the patient swallowing the capsule. Patient swallowed capsule without difficulty.  Patient told to call the office with any questions and if capsule has not passed after 72 hours. No further questions by the conclusion of the visit.

## 2021-12-03 NOTE — Patient Instructions (Signed)
Contact our office immediately at [phone] if you suffer from any abdominal pain, nausea, or vomiting during capsule endoscopy. °a) Do not eat or drink for at least 2 hours. °After 2 hours you may have any of the following to drink: °Water   White grape juice °7-Up   Chicken Bouillon °Sprite   Ginger Ale °c) After 4 hours you may have a light snack to include any of the following: °A cup of soup  ½ sandwich °Bowl of cereal  Rice °Toast   Eggs °2-3 small cookies (i.e. vanilla wafers or graham crackers) °d) After 8 hours you may return to your regular diet. °During your procedure do not go near anyone else that is having capsule endoscopy. °Do not be in close contact with an MRI machine or a radio or television tower. °Do not wear a heavy coat or sweater because your recorder may over heat and stop recording.   °Do not disconnect the equipment or remove the belt at any time.  Since the Data Recorder is actually a small computer, it should be treated with utmost care and protection.  Avoid sudden movement and banging of the Data Recorder.  Do not do any heavy lifting or strenuous physical activity during the test especially if it involves sweating and do not bend over or stoop during capsule endoscopy. °During capsule endoscopy, you will need to verify every 15 minutes that the small light on top of the Data Recorder is blinking twice per second.  If for some reason it stops blinking at this site, record the time and contact our office at [phone]. ° ° ° °

## 2021-12-09 ENCOUNTER — Other Ambulatory Visit (HOSPITAL_COMMUNITY): Payer: BC Managed Care – PPO

## 2021-12-11 ENCOUNTER — Encounter: Payer: Self-pay | Admitting: Gastroenterology

## 2021-12-11 ENCOUNTER — Telehealth: Payer: Self-pay | Admitting: Cardiovascular Disease

## 2021-12-11 NOTE — Telephone Encounter (Signed)
Patient states she had a capsule endoscopy and would like to know if she still needs to have her echo tomorrow, 11/30. Please advise.

## 2021-12-11 NOTE — Telephone Encounter (Signed)
Called pt back, informed that that she needs to have ECHO. She will arrive early tomorrow.

## 2021-12-11 NOTE — Telephone Encounter (Signed)
Patient called.

## 2021-12-12 ENCOUNTER — Ambulatory Visit (HOSPITAL_COMMUNITY): Payer: BC Managed Care – PPO | Attending: Cardiovascular Disease

## 2021-12-12 DIAGNOSIS — I350 Nonrheumatic aortic (valve) stenosis: Secondary | ICD-10-CM | POA: Insufficient documentation

## 2021-12-12 LAB — ECHOCARDIOGRAM COMPLETE
AR max vel: 0.61 cm2
AV Area VTI: 0.61 cm2
AV Area mean vel: 0.58 cm2
AV Mean grad: 67 mmHg
AV Peak grad: 113.5 mmHg
Ao pk vel: 5.33 m/s
Area-P 1/2: 2.34 cm2
P 1/2 time: 203 msec
S' Lateral: 2.05 cm

## 2021-12-12 NOTE — Progress Notes (Unsigned)
Virtual Visit via Telephone Note   Because of Mandy Delacruz co-morbid illnesses, she is at least at moderate risk for complications without adequate follow up.  This format is felt to be most appropriate for this patient at this time.  The patient did not have access to video technology/had technical difficulties with video requiring transitioning to audio format only (telephone).  All issues noted in this document were discussed and addressed.  No physical exam could be performed with this format.  Please refer to the patient's chart for her consent to telehealth for [facility].    Date:  12/12/2021   ID:  Mandy, Delacruz Sep 18, 1945, MRN [id number] The patient was identified using 2 identifiers.  Patient Location: Home Provider Location: Office/Clinic   PCP:  [name], [place] Providers Cardiologist:  [name], MD { Click to update primary MD,subspecialty MD or APP then REFRESH:1}    Evaluation Performed:  Follow-Up Visit  Chief Complaint:  aortic stenosis  History of Present Illness:    Mandy Delacruz is a 76 y.o. female with calcific aortic stenosis that became symptomatic following episode of severe iron deficiency anemia due to GI bleeding (hemoglobin as low as 6.7; capsule enteroscopy shows small bowel AVMs that are currently not bleeding).  Additional medical problems include hiatal hernia, GERD, diabetes mellitus type 2, hypertension, history of smoking and COPD.  Follow-up echocardiogram performed today shows that she has progressed to severe aortic stenosis.  She has normal left ventricular systolic function with EF 65-70% and does have left ventricular hypertrophy.  The peak aortic valve velocity is 5.3 m/s, mean aortic valve gradient 67 mmHg, dimensionless index 0.23, estimated aortic valve area 0.6 cm.  After correction of her anemia her symptoms have improved dramatically and she no longer has exertional dyspnea or chest  pain with usual activity although these occur if she "pushes it".  She has CCS functional class II exertional angina pectoris.  Her most recent hemoglobin on 10/26/2021 was up to 9.3.  She has normal renal function and excellent control of her blood sugar (A1c 6.0% in October).  Her most recent lipid profile showed an LDL cholesterol of 72 and HDL of 52.  Past Medical History:  Diagnosis Date   Anemia    Aortic stenosis    Asthma    Bronchitis    on bactrin since 12-27-14   Colon polyps 2001   Diverticulosis of colon (without mention of hemorrhage) 2001   Dizziness    inner ear problem at times, saw dr  [name] for   GERD (gastroesophageal reflux disease)    GI bleed    Heart murmur    Hiatal hernia 2001   Hyperlipidemia    Hypertension    IH (inguinal hernia)    left   Lung nodule    Other esophagitis 2001   Tobacco abuse    Past Surgical History:  Procedure Laterality Date   breast biospy     left, benign   CHOLECYSTECTOMY     COLONOSCOPY     COLONOSCOPY WITH PROPOFOL N/A 10/26/2021   Procedure: COLONOSCOPY WITH PROPOFOL;  Surgeon: Doran Stabler, MD;  Location: [facility];  Service: Gastroenterology;  Laterality: N/A;   ENDOVENOUS ABLATION SAPHENOUS VEIN W/ LASER Left 04/29/2016   endovenous laser ablation L SSV and stab phlebectomy left leg by Tinnie Gens MD    ESOPHAGOGASTRODUODENOSCOPY (EGD) WITH PROPOFOL N/A 10/25/2021   Procedure: ESOPHAGOGASTRODUODENOSCOPY (EGD) WITH  PROPOFOL;  Surgeon: Sharyn Creamer, MD;  Location: [facility];  Service: Gastroenterology;  Laterality: N/A;   INGUINAL HERNIA REPAIR  oct 2015   left   INGUINAL HERNIA REPAIR Left 01/10/2015   Procedure: OPEN REPAIR OF RECURRENT LEFT INGUINAL HERNIA ;  Surgeon: Greer Pickerel, MD;  Location: [facility];  Service: General;  Laterality: Left;   INSERTION OF MESH Left 01/10/2015   Procedure: INSERTION OF MESH;  Surgeon: Greer Pickerel, MD;  Location: [facility];  Service: General;  Laterality: Left;    INSERTION OF MESH N/A 08/18/2017   Procedure: INSERTION OF MESH;  Surgeon: Greer Pickerel, MD;  Location: [place];  Service: General;  Laterality: N/A;   LAPAROSCOPY N/A 01/10/2015   Procedure: LAPAROSCOPY DIAGNOSTIC;  Surgeon: Greer Pickerel, MD;  Location: [facility];  Service: General;  Laterality: N/A;   ORIF HIP FRACTURE  2006   3 pins, right   UMBILICAL HERNIA REPAIR N/A 08/18/2017   Procedure: LAPAROSCOPIC-ASSISTED UMBILICAL HERNIA  REPAIR ERAS PATHWAY;  Surgeon: Greer Pickerel, MD;  Location: [facility];  Service: General;  Laterality: N/A;     No outpatient medications have been marked as taking for the 12/12/21 encounter (Appointment) with [facility] ECHO 5.     Allergies:   Doxycycline hyclate, Ace inhibitors, Hydrocodone bit-homatrop mbr, and Codeine   Social History   Tobacco Use   Smoking status: Former    Years: 45.00    Types: Cigarettes   Smokeless tobacco: Never   Tobacco comments:    maybe 5-10 cigarettes a day  starts and stops smoking every few days  Vaping Use   Vaping Use: Never used  Substance Use Topics   Alcohol use: Yes    Alcohol/week: 0.0 - 1.0 standard drinks of alcohol    Comment: social   Drug use: No     Family Hx: The patient's family history includes COPD in her mother; Celiac disease in an other family member; Hypertension in her father. There is no history of Colon cancer, Esophageal cancer, Rectal cancer, or Stomach cancer.  ROS:   Please see the history of present illness.     All other systems reviewed and are negative.   Prior CV studies:   The following studies were reviewed today:    Echocardiogram today 12/12/2021     1. Left ventricular ejection fraction, by estimation, is 65 to 70%. Left  ventricular ejection fraction by 3D volume is 67 %. The left ventricle has  normal function. The left ventricle has no regional wall motion  abnormalities. There is severe left  ventricular hypertrophy of the basal-septal segment. Left ventricular   diastolic parameters are consistent with Grade I diastolic dysfunction  (impaired relaxation). The average left ventricular global longitudinal  strain is -18.0 %. The global  longitudinal strain is normal.   2. Right ventricular systolic function is normal. The right ventricular  size is normal. There is normal pulmonary artery systolic pressure. The  estimated right ventricular systolic pressure is 16.1 mmHg.   3. Left atrial size was severely dilated.   4. The mitral valve is normal in structure. Mild mitral valve  regurgitation. No evidence of mitral stenosis. Severe mitral annular  calcification.   5. The aortic valve is calcified. There is severe calcifcation of the  aortic valve. There is severe thickening of the aortic valve. Aortic valve  regurgitation is not visualized. Severe aortic valve stenosis. Aortic  valve area, by VTI measures 0.61 cm.  Aortic valve mean gradient measures 67.0  mmHg. Aortic valve Vmax measures  5.33 m/s. DVI 0.23.   6. There is borderline dilatation of the ascending aorta, measuring 37  mm.   7. The inferior vena cava is normal in size with greater than 50%  respiratory variability, suggesting right atrial pressure of 3 mmHg.   8. Compared to study dated 12/26/2019, there has been progression aortic  stenosis to severe. The mean AVG has increased from 29.63mHg to 617mG, AV  Vmax has increased from 3.719mto 5.33 m/s, DVI has decreased from 0.42  to 0.23.   Labs/Other Tests and Data Reviewed:    EKG:   ECG performed 11/08/2021 shows sinus rhythm with first-degree AV block (PR interval 220 ms) and subtle T wave inversion leads I and aVL, otherwise normal tracing, QTc 401 ms  Recent Labs: 10/24/2021: ALT 34 10/26/2021: BUN 13; Creatinine, Ser 0.91; Hemoglobin 9.3; Platelets 285; Potassium 4.2; Sodium 134  10/25/2021  Hemoglobin A1c 6.0%  Recent Lipid Panel No results found for: "CHOL", "TRIG", "HDL", "CHOLHDL", "LDLCALC",  "LDLDIRECT"     11/28/2019 Cholesterol 198, HDL 42, LDL 127, triglycerides 170 10/31/2021 Cholesterol 146, HDL 52, LDL 72, triglycerides 122  Wt Readings from Last 3 Encounters:  11/15/21 61.7 kg  11/08/21 62.1 kg  10/26/21 64 kg     Risk Assessment/Calculations:          Objective:    Vital Signs:  There were no vitals taken for this visit.   Phone visit.  ASSESSMENT & PLAN:    Severe aortic stenosis: As was expected based on her physical exam at her last clinic appointment (essentially absent S2), echo shows findings are all consistent with very severe aortic stenosis and I think we need to accelerate her workup for aortic valve replacement.  Suspect that she will be a good candidate for TAVR rather than SAVR.  She does not have a history of coronary disease and did not have evidence of aortic aneurysm on previous CT of the chest..  We have reviewed the fact that she will need to undergo right and left heart catheterization as well as CT angiography of the aorta from chest to abdomen and pelvis to make sure that she is a good candidate for TAVR.  Reviewed the fact that the recommendation for optimal AVR approach is made by consensus between the interventional cardiologist and cardiac surgeon, whom she will need to meet.  Would like to get her heart catheterization done in December, since it is likely she will soon develop significant symptoms from her aortic stenosis.  Will reach out to our structural heart team to get this scheduled.  We discussed the risks and benefits, possible complications of right left heart catheterization in great detail today.  She understands and agrees to proceed. Aortic atherosclerosis: Documented on CT of the chest.  Also suspect that she has CAD.   Lipid parameters close to target. Currently not on aspirin due to suspected GI bleeding. HTN: Well-controlled. HLP: Satisfactory lipid parameters on current medications. DM: Glycemic control has improved  symptoms with attention to diet, without medications.  Most recent hemoglobin A1c down from 6.7% to 6.0%. First-degree AV block: No signs or symptoms of high-grade AV conduction abnormalities, may be at high risk for pacemaker with TAVR in the future. COPD: Has quit smoking.  Symptoms fairly well controlled with inhalers.  Iron deficiency anemia: Improving with iron supplementation.  Capsule enteroscopy did not show evidence of active bleeding.  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  Will reach out to Dr. Loletha Carrow to discuss optimal antiplatelet therapy following TAVR.     Shared Decision Making/Informed Consent{ All outpatient stress tests require an informed consent ([id number]) ATTESTATION ORDER       :[id number]} The risks [stroke (1 in 1000), death (1 in 1000), kidney failure [usually temporary] (1 in 500), bleeding (1 in 200), allergic reaction [possibly serious] (1 in 200)], benefits (diagnostic support and management of coronary artery disease) and alternatives of a cardiac catheterization were discussed in detail with Ms. Callow and she is willing to proceed.     Time:   Today, I have spent 25 minutes with the patient with telehealth technology discussing the above problems.     Medication Adjustments/Labs and Tests Ordered: Current medicines are reviewed at length with the patient today.  Concerns regarding medicines are outlined above.   Tests Ordered: No orders of the defined types were placed in this encounter.   Medication Changes: No orders of the defined types were placed in this encounter.   Follow Up: Scheduled for right and left heart catheterization.  Signed, [name], MD  12/12/2021 5:41 PM    [place]

## 2021-12-13 ENCOUNTER — Other Ambulatory Visit: Payer: Self-pay | Admitting: Cardiology

## 2021-12-13 ENCOUNTER — Telehealth: Payer: Self-pay

## 2021-12-13 DIAGNOSIS — I350 Nonrheumatic aortic (valve) stenosis: Secondary | ICD-10-CM

## 2021-12-13 NOTE — Telephone Encounter (Signed)
Called patient to go over heart cath instructions. She verbalized understanding and all (if any) questions were answered. I mailed instructions to patient. She also asked about a voicemail message she received to call our office back for results. I gave her the results of her ECHO and reminded her that she and Dr. Sallyanne Kuster discussed this on the telephone the previous day (12/12/21). She remembered and thanked me for calling with the instructions for her procedure and giving her the results.   Instructions in another encounter

## 2021-12-16 ENCOUNTER — Other Ambulatory Visit: Payer: BC Managed Care – PPO

## 2021-12-16 ENCOUNTER — Other Ambulatory Visit: Payer: Self-pay

## 2021-12-16 ENCOUNTER — Ambulatory Visit (INDEPENDENT_AMBULATORY_CARE_PROVIDER_SITE_OTHER)
Admission: RE | Admit: 2021-12-16 | Discharge: 2021-12-16 | Disposition: A | Discharge status: Home / Self Care | Payer: BC Managed Care – PPO | Source: Ambulatory Visit | Attending: Gastroenterology | Admitting: Gastroenterology

## 2021-12-16 ENCOUNTER — Telehealth: Payer: Self-pay | Admitting: Gastroenterology

## 2021-12-16 DIAGNOSIS — R195 Other fecal abnormalities: Secondary | ICD-10-CM | POA: Diagnosis not present

## 2021-12-16 DIAGNOSIS — M47816 Spondylosis without myelopathy or radiculopathy, lumbar region: Secondary | ICD-10-CM | POA: Diagnosis not present

## 2021-12-16 DIAGNOSIS — T184XXA Foreign body in colon, initial encounter: Secondary | ICD-10-CM | POA: Diagnosis not present

## 2021-12-16 DIAGNOSIS — I350 Nonrheumatic aortic (valve) stenosis: Secondary | ICD-10-CM

## 2021-12-16 DIAGNOSIS — D509 Iron deficiency anemia, unspecified: Secondary | ICD-10-CM | POA: Diagnosis not present

## 2021-12-16 DIAGNOSIS — Z0389 Encounter for observation for other suspected diseases and conditions ruled out: Secondary | ICD-10-CM | POA: Diagnosis not present

## 2021-12-16 MED ORDER — METOPROLOL TARTRATE 50 MG PO TABS: ORAL_TABLET | ORAL | 0 refills | Status: DC

## 2021-12-16 NOTE — Telephone Encounter (Addendum)
Returned call to patient. I informed her that if she is not sure if she has passed the capsule she will need to come by for an x-ray. Pt is aware that no appt is necessary and she will need to stop by the x-ray department in the basement of our office. Pt wanted to know if results would be back before her surgery on Thursday. I told pt that the results should be back and we will contact her once the provider has reviewed. Pt verbalized understanding and had no concerns at the end of the call.  X-ray order in epic.

## 2021-12-16 NOTE — Telephone Encounter (Signed)
Patient had the camera EGD done recently and has not passed the camera from what she can tell and is very concerned. She is also having a heart catheterization on Thursday and wants to know will that be affected by this situation. Please advise. Thank you.

## 2021-12-17 ENCOUNTER — Telehealth: Payer: Self-pay

## 2021-12-17 ENCOUNTER — Other Ambulatory Visit
Admission: RE | Admit: 2021-12-17 | Discharge: 2021-12-17 | Disposition: A | Discharge status: Home / Self Care | Payer: BC Managed Care – PPO | Source: Ambulatory Visit | Attending: Cardiovascular Disease | Admitting: Cardiovascular Disease

## 2021-12-17 ENCOUNTER — Other Ambulatory Visit: Payer: Self-pay

## 2021-12-17 ENCOUNTER — Encounter: Payer: Self-pay | Admitting: Gastroenterology

## 2021-12-17 ENCOUNTER — Encounter: Payer: Self-pay | Admitting: Cardiovascular Disease

## 2021-12-17 ENCOUNTER — Ambulatory Visit: Payer: BC Managed Care – PPO | Attending: Cardiovascular Disease | Admitting: Cardiovascular Disease

## 2021-12-17 VITALS — BP 143/79 | HR 95 | Ht 63.0 in | Wt 137.0 lb

## 2021-12-17 DIAGNOSIS — I350 Nonrheumatic aortic (valve) stenosis: Secondary | ICD-10-CM | POA: Diagnosis not present

## 2021-12-17 DIAGNOSIS — Z01810 Encounter for preprocedural cardiovascular examination: Secondary | ICD-10-CM

## 2021-12-17 LAB — CBC
HCT: 40 % (ref 36.0–46.0)
Hemoglobin: 12.9 g/dL (ref 12.0–15.0)
MCH: 29.6 pg (ref 26.0–34.0)
MCHC: 32.3 g/dL (ref 30.0–36.0)
MCV: 91.7 fL (ref 80.0–100.0)
Platelets: 202 10*3/uL (ref 150–400)
RBC: 4.36 MIL/uL (ref 3.87–5.11)
RDW: 16 % — ABNORMAL HIGH (ref 11.5–15.5)
WBC: 7.4 10*3/uL (ref 4.0–10.5)
nRBC: 0 % (ref 0.0–0.2)

## 2021-12-17 LAB — BASIC METABOLIC PANEL
Anion gap: 9 (ref 5–15)
BUN: 23 mg/dL (ref 8–23)
CO2: 25 mmol/L (ref 22–32)
Calcium: 10.6 mg/dL — ABNORMAL HIGH (ref 8.9–10.3)
Chloride: 103 mmol/L (ref 98–111)
Creatinine, Ser: 0.78 mg/dL (ref 0.44–1.00)
GFR, Estimated: >60 mL/min (ref >60)
Glucose, Bld: 87 mg/dL (ref 70–99)
Potassium: 4.8 mmol/L (ref 3.5–5.1)
Sodium: 137 mmol/L (ref 135–145)

## 2021-12-17 NOTE — Telephone Encounter (Signed)
Called patient to inform her that she will need labs for her upcoming cath procedure. Gave patient the address to the [facility] in [place] and also informed her that he can come into our office to have labs drawn. She informed me that she still has not received her cath instructions mailed out to her. I asked if she is active on MyChart if so I can sent a message with the instructions. She thanked me for calling and all (if any) questions were answered.

## 2021-12-17 NOTE — H&P (View-Only) (Signed)
LATE ENTRY from virtual visit 12/12/2021    Virtual Visit via Telephone Note    Because of FARHEEN PFAHLER co-morbid illnesses, she is at least at moderate risk for complications without adequate follow up.  This format is felt to be most appropriate for this patient at this time.  The patient did not have access to video technology/had technical difficulties with video requiring transitioning to audio format only (telephone).  All issues noted in this document were discussed and addressed.  No physical exam could be performed with this format.  Please refer to the patient's chart for her consent to telehealth for [facility].      Date:  12/12/2021    ID:  Mandy, Delacruz [birth year], MRN [id number] The patient was identified using 2 identifiers.   Patient Location: Home Provider Location: Office/Clinic     PCP:  [name], [place] [facility] Cardiologist:  [name], MD      Evaluation Performed:  Follow-Up Visit   Chief Complaint:  aortic stenosis   History of Present Illness:     Mandy Delacruz is a 76 y.o. female with calcific aortic stenosis that became symptomatic following episode of severe iron deficiency anemia due to GI bleeding (hemoglobin as low as 6.7; capsule enteroscopy shows small bowel AVMs that are currently not bleeding).  Additional medical problems include hiatal hernia, GERD, diabetes mellitus type 2, hypertension, history of smoking and COPD.   Follow-up echocardiogram performed today shows that she has progressed to severe aortic stenosis.  She has normal left ventricular systolic function with EF 65-70% and does have left ventricular hypertrophy.  The peak aortic valve velocity is 5.3 m/s, mean aortic valve gradient 67 mmHg, dimensionless index 0.23, estimated aortic valve area 0.6 cm.   After correction of her anemia her symptoms have improved dramatically and she no longer has exertional dyspnea or chest  pain with usual activity although these occur if she "pushes it".  She has CCS functional class II exertional angina pectoris.  Her most recent hemoglobin on 10/26/2021 was up to 9.3.   She has normal renal function and excellent control of her blood sugar (A1c 6.0% in October).  Her most recent lipid profile showed an LDL cholesterol of 72 and HDL of 52.       Past Medical History:  Diagnosis Date   Anemia     Aortic stenosis     Asthma     Bronchitis      on bactrin since 12-27-14   Colon polyps 2001   Diverticulosis of colon (without mention of hemorrhage) 2001   Dizziness      inner ear problem at times, saw dr  [name] for   GERD (gastroesophageal reflux disease)     GI bleed     Heart murmur     Hiatal hernia 2001   Hyperlipidemia     Hypertension     IH (inguinal hernia)      left   Lung nodule     Other esophagitis 2001   Tobacco abuse           Past Surgical History:  Procedure Laterality Date   breast biospy        left, benign   CHOLECYSTECTOMY       COLONOSCOPY       COLONOSCOPY WITH PROPOFOL N/A 10/26/2021    Procedure: COLONOSCOPY WITH PROPOFOL;  Surgeon: Doran Stabler, MD;  Location: [place];  Service: Gastroenterology;  Laterality: N/A;   ENDOVENOUS ABLATION SAPHENOUS VEIN W/ LASER Left 04/29/2016    endovenous laser ablation L SSV and stab phlebectomy left leg by Tinnie Gens MD    ESOPHAGOGASTRODUODENOSCOPY (EGD) WITH PROPOFOL N/A 10/25/2021    Procedure: ESOPHAGOGASTRODUODENOSCOPY (EGD) WITH PROPOFOL;  Surgeon: Sharyn Creamer, MD;  Location: [facility] ENDOSCOPY;  Service: Gastroenterology;  Laterality: N/A;   INGUINAL HERNIA REPAIR   oct 2015    left   INGUINAL HERNIA REPAIR Left 01/10/2015    Procedure: OPEN REPAIR OF RECURRENT LEFT INGUINAL HERNIA ;  Surgeon: Greer Pickerel, MD;  Location: [facility];  Service: General;  Laterality: Left;   INSERTION OF MESH Left 01/10/2015    Procedure: INSERTION OF MESH;  Surgeon: Greer Pickerel, MD;  Location: [facility];  Service: General;  Laterality: Left;   INSERTION OF MESH N/A 08/18/2017    Procedure: INSERTION OF MESH;  Surgeon: Greer Pickerel, MD;  Location: [place];  Service: General;  Laterality: N/A;   LAPAROSCOPY N/A 01/10/2015    Procedure: LAPAROSCOPY DIAGNOSTIC;  Surgeon: Greer Pickerel, MD;  Location: [facility];  Service: General;  Laterality: N/A;   ORIF HIP FRACTURE   2006    3 pins, right   UMBILICAL HERNIA REPAIR N/A 08/18/2017    Procedure: LAPAROSCOPIC-ASSISTED UMBILICAL HERNIA  REPAIR ERAS PATHWAY;  Surgeon: Greer Pickerel, MD;  Location: [place];  Service: General;  Laterality: N/A;      Active Medications  No outpatient medications have been marked as taking for the 12/12/21 encounter (Appointment) with [facility] [place] [facility].        Allergies:   Doxycycline hyclate, Ace inhibitors, Hydrocodone bit-homatrop mbr, and Codeine    Social History         Tobacco Use   Smoking status: Former      Years: 45.00      Types: Cigarettes   Smokeless tobacco: Never   Tobacco comments:      maybe 5-10 cigarettes a day  starts and stops smoking every few days  Vaping Use   Vaping Use: Never used  Substance Use Topics   Alcohol use: Yes      Alcohol/week: 0.0 - 1.0 standard drinks of alcohol      Comment: social   Drug use: No      Family Hx: The patient's family history includes COPD in her mother; Celiac disease in an other family member; Hypertension in her father. There is no history of Colon cancer, Esophageal cancer, Rectal cancer, or Stomach cancer.   ROS:   Please see the history of present illness.      All other systems reviewed and are negative.     Prior CV studies:   The following studies were reviewed today:     Echocardiogram today 12/12/2021      1. Left ventricular ejection fraction, by estimation, is 65 to 70%. Left  ventricular ejection fraction by 3D volume is 67 %. The left ventricle has  normal function. The left ventricle has no regional wall motion  abnormalities.  There is severe left  ventricular hypertrophy of the basal-septal segment. Left ventricular  diastolic parameters are consistent with Grade I diastolic dysfunction  (impaired relaxation). The average left ventricular global longitudinal  strain is -18.0 %. The global  longitudinal strain is normal.   2. Right ventricular systolic function is normal. The right ventricular  size is normal. There is normal pulmonary  artery systolic pressure. The  estimated right ventricular systolic pressure is 16.1 mmHg.   3. Left atrial size was severely dilated.   4. The mitral valve is normal in structure. Mild mitral valve  regurgitation. No evidence of mitral stenosis. Severe mitral annular  calcification.   5. The aortic valve is calcified. There is severe calcifcation of the  aortic valve. There is severe thickening of the aortic valve. Aortic valve  regurgitation is not visualized. Severe aortic valve stenosis. Aortic  valve area, by VTI measures 0.61 cm.  Aortic valve mean gradient measures 67.0 mmHg. Aortic valve Vmax measures  5.33 m/s. DVI 0.23.   6. There is borderline dilatation of the ascending aorta, measuring 37  mm.   7. The inferior vena cava is normal in size with greater than 50%  respiratory variability, suggesting right atrial pressure of 3 mmHg.   8. Compared to study dated 12/26/2019, there has been progression aortic  stenosis to severe. The mean AVG has increased from 29.83mHg to 649mG, AV  Vmax has increased from 3.7138mto 5.33 m/s, DVI has decreased from 0.42  to 0.23.    Labs/Other Tests and Data Reviewed:     EKG:   ECG performed 11/08/2021 shows sinus rhythm with first-degree AV block (PR interval 220 ms) and subtle T wave inversion leads I and aVL, otherwise normal tracing, QTc 401 ms   Recent Labs: 10/24/2021: ALT 34 10/26/2021: BUN 13; Creatinine, Ser 0.91; Hemoglobin 9.3; Platelets 285; Potassium 4.2; Sodium 134  10/25/2021  Hemoglobin A1c 6.0%   Recent Lipid  Panel Labs (Brief)  No results found for: "CHOL", "TRIG", "HDL", "CHOLHDL", "LDLCALC", "LDLDIRECT"           11/28/2019 Cholesterol 198, HDL 42, LDL 127, triglycerides 170 10/31/2021 Cholesterol 146, HDL 52, LDL 72, triglycerides 122      Wt Readings from Last 3 Encounters:  11/15/21 61.7 kg  11/08/21 62.1 kg  10/26/21 64 kg      Risk Assessment/Calculations:           Objective:     Vital Signs:  There were no vitals taken for this visit.    Phone visit.   ASSESSMENT & PLAN:     Severe aortic stenosis: As was expected based on her physical exam at her last clinic appointment (essentially absent S2), echo shows findings are all consistent with very severe aortic stenosis and I think we need to accelerate her workup for aortic valve replacement.  Suspect that she will be a good candidate for TAVR rather than SAVR.  She does not have a history of coronary disease and did not have evidence of aortic aneurysm on previous CT of the chest..  We have reviewed the fact that she will need to undergo right and left heart catheterization as well as CT angiography of the aorta from chest to abdomen and pelvis to make sure that she is a good candidate for TAVR.  Reviewed the fact that the recommendation for optimal AVR approach is made by consensus between the interventional cardiologist and cardiac surgeon, whom she will need to meet.  Would like to get her heart catheterization done in December, since it is likely she will soon develop significant symptoms from her aortic stenosis.  Will reach out to our structural heart team to get this scheduled.  We discussed the risks and benefits, possible complications of right left heart catheterization in great detail today.  She understands and agrees to proceed. Aortic atherosclerosis: Documented on CT  of the chest.  Also suspect that she has CAD.   Lipid parameters close to target. Currently not on aspirin due to suspected GI bleeding. HTN:  Well-controlled. HLP: Satisfactory lipid parameters on current medications. DM: Glycemic control has improved symptoms with attention to diet, without medications.  Most recent hemoglobin A1c down from 6.7% to 6.0%. First-degree AV block: No signs or symptoms of high-grade AV conduction abnormalities, may be at high risk for pacemaker with TAVR in the future. COPD: Has quit smoking.  Symptoms fairly well controlled with inhalers.  Iron deficiency anemia: Improving with iron supplementation.  Capsule enteroscopy did not show evidence of active bleeding.  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  Will reach out to Dr. Loletha Carrow to discuss optimal antiplatelet therapy following TAVR.      Shared Decision Making/Informed Consent The risks [stroke (1 in 1000), death (1 in 1000), kidney failure [usually temporary] (1 in 500), bleeding (1 in 200), allergic reaction [possibly serious] (1 in 200)], benefits (diagnostic support and management of coronary artery disease) and alternatives of a cardiac catheterization were discussed in detail with Mandy Delacruz and she is willing to proceed.        Time:   Today, I have spent 25 minutes with the patient with telehealth technology discussing the above problems.       Medication Adjustments/Labs and Tests Ordered: Current medicines are reviewed at length with the patient today.  Concerns regarding medicines are outlined above.    Tests Ordered: No orders of the defined types were placed in this encounter.     Medication Changes: No orders of the defined types were placed in this encounter.     Follow Up: Scheduled for right and left heart catheterization.   Signed, [name], MD  12/12/2021 5:41 PM    [place]

## 2021-12-17 NOTE — Progress Notes (Signed)
LATE ENTRY from virtual visit 12/12/2021    Virtual Visit via Telephone Note    Because of Mandy Delacruz co-morbid illnesses, she is at least at moderate risk for complications without adequate follow up.  This format is felt to be most appropriate for this patient at this time.  The patient did not have access to video technology/had technical difficulties with video requiring transitioning to audio format only (telephone).  All issues noted in this document were discussed and addressed.  No physical exam could be performed with this format.  Please refer to the patient's chart for her consent to telehealth for [facility].      Date:  12/12/2021    ID:  Mandy Delacruz, Mandy Delacruz January 15, 1945, MRN [id number] The patient was identified using 2 identifiers.   Patient Location: Home Provider Location: Office/Clinic     PCP:  [name], [place] [facility] Cardiologist:  [name], MD      Evaluation Performed:  Follow-Up Visit   Chief Complaint:  aortic stenosis   History of Present Illness:     Mandy Delacruz is a 76 y.o. female with calcific aortic stenosis that became symptomatic following episode of severe iron deficiency anemia due to GI bleeding (hemoglobin as low as 6.7; capsule enteroscopy shows small bowel AVMs that are currently not bleeding).  Additional medical problems include hiatal hernia, GERD, diabetes mellitus type 2, hypertension, history of smoking and COPD.   Follow-up echocardiogram performed today shows that she has progressed to severe aortic stenosis.  She has normal left ventricular systolic function with EF 65-70% and does have left ventricular hypertrophy.  The peak aortic valve velocity is 5.3 m/s, mean aortic valve gradient 67 mmHg, dimensionless index 0.23, estimated aortic valve area 0.6 cm.   After correction of her anemia her symptoms have improved dramatically and she no longer has exertional dyspnea or chest  pain with usual activity although these occur if she "pushes it".  She has CCS functional class II exertional angina pectoris.  Her most recent hemoglobin on 10/26/2021 was up to 9.3.   She has normal renal function and excellent control of her blood sugar (A1c 6.0% in October).  Her most recent lipid profile showed an LDL cholesterol of 72 and HDL of 52.       Past Medical History:  Diagnosis Date   Anemia     Aortic stenosis     Asthma     Bronchitis      on bactrin since 12-27-14   Colon polyps 2001   Diverticulosis of colon (without mention of hemorrhage) 2001   Dizziness      inner ear problem at times, saw dr  [name] for   GERD (gastroesophageal reflux disease)     GI bleed     Heart murmur     Hiatal hernia 2001   Hyperlipidemia     Hypertension     IH (inguinal hernia)      left   Lung nodule     Other esophagitis 2001   Tobacco abuse           Past Surgical History:  Procedure Laterality Date   breast biospy        left, benign   CHOLECYSTECTOMY       COLONOSCOPY       COLONOSCOPY WITH PROPOFOL N/A 10/26/2021    Procedure: COLONOSCOPY WITH PROPOFOL;  Surgeon: Doran Stabler, MD;  Location: [place];  Service: Gastroenterology;  Laterality: N/A;   ENDOVENOUS ABLATION SAPHENOUS VEIN W/ LASER Left 04/29/2016    endovenous laser ablation L SSV and stab phlebectomy left leg by Tinnie Gens MD    ESOPHAGOGASTRODUODENOSCOPY (EGD) WITH PROPOFOL N/A 10/25/2021    Procedure: ESOPHAGOGASTRODUODENOSCOPY (EGD) WITH PROPOFOL;  Surgeon: Sharyn Creamer, MD;  Location: [facility] ENDOSCOPY;  Service: Gastroenterology;  Laterality: N/A;   INGUINAL HERNIA REPAIR   oct 2015    left   INGUINAL HERNIA REPAIR Left 01/10/2015    Procedure: OPEN REPAIR OF RECURRENT LEFT INGUINAL HERNIA ;  Surgeon: Greer Pickerel, MD;  [facility];  Service: General;  Laterality: Left;   INSERTION OF MESH Left 01/10/2015    Procedure: INSERTION OF MESH;  Surgeon: Greer Pickerel, MD;  [facility];  Service: General;  Laterality: Left;   INSERTION OF MESH N/A 08/18/2017    Procedure: INSERTION OF MESH;  Surgeon: Greer Pickerel, MD;  Location: [place];  Service: General;  Laterality: N/A;   LAPAROSCOPY N/A 01/10/2015    Procedure: LAPAROSCOPY DIAGNOSTIC;  Surgeon: Greer Pickerel, MD;  [facility];  Service: General;  Laterality: N/A;   ORIF HIP FRACTURE   2006    3 pins, right   UMBILICAL HERNIA REPAIR N/A 08/18/2017    Procedure: LAPAROSCOPIC-ASSISTED UMBILICAL HERNIA  REPAIR ERAS PATHWAY;  Surgeon: Greer Pickerel, MD;  Location: [place];  Service: General;  Laterality: N/A;      Active Medications  No outpatient medications have been marked as taking for the 12/12/21 encounter (Appointment) with [facility] [place] [facility].        Allergies:   Doxycycline hyclate, Ace inhibitors, Hydrocodone bit-homatrop mbr, and Codeine    Social History         Tobacco Use   Smoking status: Former      Years: 45.00      Types: Cigarettes   Smokeless tobacco: Never   Tobacco comments:      maybe 5-10 cigarettes a day  starts and stops smoking every few days  Vaping Use   Vaping Use: Never used  Substance Use Topics   Alcohol use: Yes      Alcohol/week: 0.0 - 1.0 standard drinks of alcohol      Comment: social   Drug use: No      Family Hx: The patient's family history includes COPD in her mother; Celiac disease in an other family member; Hypertension in her father. There is no history of Colon cancer, Esophageal cancer, Rectal cancer, or Stomach cancer.   ROS:   Please see the history of present illness.      All other systems reviewed and are negative.     Prior CV studies:   The following studies were reviewed today:     Echocardiogram today 12/12/2021      1. Left ventricular ejection fraction, by estimation, is 65 to 70%. Left  ventricular ejection fraction by 3D volume is 67 %. The left ventricle has  normal function. The left ventricle has no regional wall motion  abnormalities.  There is severe left  ventricular hypertrophy of the basal-septal segment. Left ventricular  diastolic parameters are consistent with Grade I diastolic dysfunction  (impaired relaxation). The average left ventricular global longitudinal  strain is -18.0 %. The global  longitudinal strain is normal.   2. Right ventricular systolic function is normal. The right ventricular  size is normal. There is normal pulmonary  artery systolic pressure. The  estimated right ventricular systolic pressure is 16.1 mmHg.   3. Left atrial size was severely dilated.   4. The mitral valve is normal in structure. Mild mitral valve  regurgitation. No evidence of mitral stenosis. Severe mitral annular  calcification.   5. The aortic valve is calcified. There is severe calcifcation of the  aortic valve. There is severe thickening of the aortic valve. Aortic valve  regurgitation is not visualized. Severe aortic valve stenosis. Aortic  valve area, by VTI measures 0.61 cm.  Aortic valve mean gradient measures 67.0 mmHg. Aortic valve Vmax measures  5.33 m/s. DVI 0.23.   6. There is borderline dilatation of the ascending aorta, measuring 37  mm.   7. The inferior vena cava is normal in size with greater than 50%  respiratory variability, suggesting right atrial pressure of 3 mmHg.   8. Compared to study dated 12/26/2019, there has been progression aortic  stenosis to severe. The mean AVG has increased from 29.66mHg to 650mG, AV  Vmax has increased from 3.711mto 5.33 m/s, DVI has decreased from 0.42  to 0.23.    Labs/Other Tests and Data Reviewed:     EKG:   ECG performed 11/08/2021 shows sinus rhythm with first-degree AV block (PR interval 220 ms) and subtle T wave inversion leads I and aVL, otherwise normal tracing, QTc 401 ms   Recent Labs: 10/24/2021: ALT 34 10/26/2021: BUN 13; Creatinine, Ser 0.91; Hemoglobin 9.3; Platelets 285; Potassium 4.2; Sodium 134  10/25/2021  Hemoglobin A1c 6.0%   Recent Lipid  Panel Labs (Brief)  No results found for: "CHOL", "TRIG", "HDL", "CHOLHDL", "LDLCALC", "LDLDIRECT"           11/28/2019 Cholesterol 198, HDL 42, LDL 127, triglycerides 170 10/31/2021 Cholesterol 146, HDL 52, LDL 72, triglycerides 122      Wt Readings from Last 3 Encounters:  11/15/21 61.7 kg  11/08/21 62.1 kg  10/26/21 64 kg      Risk Assessment/Calculations:           Objective:     Vital Signs:  There were no vitals taken for this visit.    Phone visit.   ASSESSMENT & PLAN:     Severe aortic stenosis: As was expected based on her physical exam at her last clinic appointment (essentially absent S2), echo shows findings are all consistent with very severe aortic stenosis and I think we need to accelerate her workup for aortic valve replacement.  Suspect that she will be a good candidate for TAVR rather than SAVR.  She does not have a history of coronary disease and did not have evidence of aortic aneurysm on previous CT of the chest..  We have reviewed the fact that she will need to undergo right and left heart catheterization as well as CT angiography of the aorta from chest to abdomen and pelvis to make sure that she is a good candidate for TAVR.  Reviewed the fact that the recommendation for optimal AVR approach is made by consensus between the interventional cardiologist and cardiac surgeon, whom she will need to meet.  Would like to get her heart catheterization done in December, since it is likely she will soon develop significant symptoms from her aortic stenosis.  Will reach out to our structural heart team to get this scheduled.  We discussed the risks and benefits, possible complications of right left heart catheterization in great detail today.  She understands and agrees to proceed. Aortic atherosclerosis: Documented on CT  of the chest.  Also suspect that she has CAD.   Lipid parameters close to target. Currently not on aspirin due to suspected GI bleeding. HTN:  Well-controlled. HLP: Satisfactory lipid parameters on current medications. DM: Glycemic control has improved symptoms with attention to diet, without medications.  Most recent hemoglobin A1c down from 6.7% to 6.0%. First-degree AV block: No signs or symptoms of high-grade AV conduction abnormalities, may be at high risk for pacemaker with TAVR in the future. COPD: Has quit smoking.  Symptoms fairly well controlled with inhalers.  Iron deficiency anemia: Improving with iron supplementation.  Capsule enteroscopy did not show evidence of active bleeding.  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  Will reach out to Dr. Loletha Carrow to discuss optimal antiplatelet therapy following TAVR.      Shared Decision Making/Informed Consent The risks [stroke (1 in 1000), death (1 in 1000), kidney failure [usually temporary] (1 in 500), bleeding (1 in 200), allergic reaction [possibly serious] (1 in 200)], benefits (diagnostic support and management of coronary artery disease) and alternatives of a cardiac catheterization were discussed in detail with Ms. Lumb and she is willing to proceed.        Time:   Today, I have spent 25 minutes with the patient with telehealth technology discussing the above problems.       Medication Adjustments/Labs and Tests Ordered: Current medicines are reviewed at length with the patient today.  Concerns regarding medicines are outlined above.    Tests Ordered: No orders of the defined types were placed in this encounter.     Medication Changes: No orders of the defined types were placed in this encounter.     Follow Up: Scheduled for right and left heart catheterization.   Signed, [name], MD  12/12/2021 5:41 PM    [place]

## 2021-12-18 ENCOUNTER — Telehealth: Payer: Self-pay | Admitting: *Deleted

## 2021-12-18 NOTE — Telephone Encounter (Signed)
Cardiac Catheterization scheduled at [facility] for: Thursday December 19, 2021 10:30 AM Arrival time and place: [place] Entrance A at: 8:30 AM  Nothing to eat after midnight prior to procedure, clear liquids until 5 AM day of procedure.  Medication instructions: -Hold:  Losartan/HCTZ- AM of procedure  -Except hold medications usual morning medications can be taken with sips of water including aspirin 81 mg.  Confirmed patient has responsible adult to drive home post procedure and be with patient first 24 hours after arriving home.  Patient reports no new symptoms concerning for COVID-19 in the past 10 days.  Reviewed procedure instructions with patient.

## 2021-12-19 ENCOUNTER — Encounter (HOSPITAL_COMMUNITY)
Admission: RE | Disposition: A | Discharge status: Home / Self Care | Payer: Self-pay | Source: Home / Self Care | Attending: Cardiovascular Disease

## 2021-12-19 ENCOUNTER — Encounter: Payer: Self-pay | Admitting: Gastroenterology

## 2021-12-19 ENCOUNTER — Ambulatory Visit (HOSPITAL_COMMUNITY)
Admission: RE | Admit: 2021-12-19 | Discharge: 2021-12-19 | Disposition: A | Discharge status: Home / Self Care | Payer: BC Managed Care – PPO | Attending: Cardiovascular Disease | Admitting: Cardiovascular Disease

## 2021-12-19 DIAGNOSIS — I440 Atrioventricular block, first degree: Secondary | ICD-10-CM | POA: Diagnosis not present

## 2021-12-19 DIAGNOSIS — D509 Iron deficiency anemia, unspecified: Secondary | ICD-10-CM | POA: Diagnosis not present

## 2021-12-19 DIAGNOSIS — E785 Hyperlipidemia, unspecified: Secondary | ICD-10-CM | POA: Insufficient documentation

## 2021-12-19 DIAGNOSIS — I350 Nonrheumatic aortic (valve) stenosis: Secondary | ICD-10-CM | POA: Diagnosis not present

## 2021-12-19 DIAGNOSIS — Z87891 Personal history of nicotine dependence: Secondary | ICD-10-CM | POA: Diagnosis not present

## 2021-12-19 DIAGNOSIS — I10 Essential (primary) hypertension: Secondary | ICD-10-CM | POA: Diagnosis not present

## 2021-12-19 DIAGNOSIS — E119 Type 2 diabetes mellitus without complications: Secondary | ICD-10-CM | POA: Insufficient documentation

## 2021-12-19 DIAGNOSIS — J449 Chronic obstructive pulmonary disease, unspecified: Secondary | ICD-10-CM | POA: Insufficient documentation

## 2021-12-19 DIAGNOSIS — K219 Gastro-esophageal reflux disease without esophagitis: Secondary | ICD-10-CM | POA: Diagnosis not present

## 2021-12-19 HISTORY — PX: RIGHT HEART CATH AND CORONARY ANGIOGRAPHY: CATH118264

## 2021-12-19 LAB — POCT I-STAT EG7
Acid-base deficit: 1 mmol/L (ref 0.0–2.0)
Bicarbonate: 25 mmol/L (ref 20.0–28.0)
Calcium, Ion: 1.29 mmol/L (ref 1.15–1.40)
HCT: 32 % — ABNORMAL LOW (ref 36.0–46.0)
Hemoglobin: 10.9 g/dL — ABNORMAL LOW (ref 12.0–15.0)
O2 Saturation: 67 %
Potassium: 3.7 mmol/L (ref 3.5–5.1)
Sodium: 140 mmol/L (ref 135–145)
TCO2: 26 mmol/L (ref 22–32)
pCO2, Ven: 43.8 mmHg — ABNORMAL LOW (ref 44–60)
pH, Ven: 7.365 (ref 7.25–7.43)
pO2, Ven: 36 mmHg (ref 32–45)

## 2021-12-19 LAB — GLUCOSE, CAPILLARY
Glucose-Capillary: 128 mg/dL — ABNORMAL HIGH (ref 70–99)
Glucose-Capillary: 96 mg/dL (ref 70–99)

## 2021-12-19 LAB — POCT I-STAT 7, (LYTES, BLD GAS, ICA,H+H)
Acid-base deficit: 3 mmol/L — ABNORMAL HIGH (ref 0.0–2.0)
Bicarbonate: 21.1 mmol/L (ref 20.0–28.0)
Calcium, Ion: 1.15 mmol/L (ref 1.15–1.40)
HCT: 30 % — ABNORMAL LOW (ref 36.0–46.0)
Hemoglobin: 10.2 g/dL — ABNORMAL LOW (ref 12.0–15.0)
O2 Saturation: 94 %
Potassium: 3.4 mmol/L — ABNORMAL LOW (ref 3.5–5.1)
Sodium: 143 mmol/L (ref 135–145)
TCO2: 22 mmol/L (ref 22–32)
pCO2 arterial: 34.7 mmHg (ref 32–48)
pH, Arterial: 7.391 (ref 7.35–7.45)
pO2, Arterial: 70 mmHg — ABNORMAL LOW (ref 83–108)

## 2021-12-19 SURGERY — RIGHT HEART CATH AND CORONARY ANGIOGRAPHY
Anesthesia: LOCAL

## 2021-12-19 MED ORDER — ONDANSETRON HCL 4 MG/2ML IJ SOLN: 4.0000 mg | Freq: Four times a day (QID) | INTRAMUSCULAR | Status: DC | PRN

## 2021-12-19 MED ORDER — SODIUM CHLORIDE 0.9% FLUSH: 3.0000 mL | Freq: Two times a day (BID) | INTRAVENOUS | Status: DC

## 2021-12-19 MED ORDER — HEPARIN SODIUM (PORCINE) 1000 UNIT/ML IJ SOLN
INTRAMUSCULAR | Status: DC | PRN
  Administered 2021-12-19: 3000 [IU] via INTRAVENOUS

## 2021-12-19 MED ORDER — MIDAZOLAM HCL 2 MG/2ML IJ SOLN
INTRAMUSCULAR | Status: AC
  Filled 2021-12-19: qty 2

## 2021-12-19 MED ORDER — FENTANYL CITRATE (PF) 100 MCG/2ML IJ SOLN
INTRAMUSCULAR | Status: DC | PRN
  Administered 2021-12-19 (×2): 25 ug via INTRAVENOUS

## 2021-12-19 MED ORDER — IOHEXOL 350 MG/ML SOLN
INTRAVENOUS | Status: DC | PRN
  Administered 2021-12-19: 40 mL

## 2021-12-19 MED ORDER — SODIUM CHLORIDE 0.9% FLUSH: 3.0000 mL | INTRAVENOUS | Status: DC | PRN

## 2021-12-19 MED ORDER — SODIUM CHLORIDE 0.9 % IV SOLN: 250.0000 mL | INTRAVENOUS | Status: DC | PRN

## 2021-12-19 MED ORDER — HEPARIN (PORCINE) IN NACL 1000-0.9 UT/500ML-% IV SOLN
INTRAVENOUS | Status: AC
  Filled 2021-12-19: qty 500

## 2021-12-19 MED ORDER — SODIUM CHLORIDE 0.9 % WEIGHT BASED INFUSION: 1.0000 mL/kg/h | INTRAVENOUS | Status: DC

## 2021-12-19 MED ORDER — SODIUM CHLORIDE 0.9 % IV SOLN: INTRAVENOUS | Status: DC

## 2021-12-19 MED ORDER — LABETALOL HCL 5 MG/ML IV SOLN: 10.0000 mg | INTRAVENOUS | Status: DC | PRN

## 2021-12-19 MED ORDER — SODIUM CHLORIDE 0.9 % WEIGHT BASED INFUSION
3.0000 mL/kg/h | INTRAVENOUS | Status: AC
  Administered 2021-12-19: 3 mL/kg/h via INTRAVENOUS

## 2021-12-19 MED ORDER — LIDOCAINE HCL (PF) 1 % IJ SOLN
INTRAMUSCULAR | Status: DC | PRN
  Administered 2021-12-19 (×2): 2 mL

## 2021-12-19 MED ORDER — VERAPAMIL HCL 2.5 MG/ML IV SOLN
INTRAVENOUS | Status: AC
  Filled 2021-12-19: qty 2

## 2021-12-19 MED ORDER — VERAPAMIL HCL 2.5 MG/ML IV SOLN
INTRAVENOUS | Status: DC | PRN
  Administered 2021-12-19: 10 mL via INTRA_ARTERIAL

## 2021-12-19 MED ORDER — FENTANYL CITRATE (PF) 100 MCG/2ML IJ SOLN
INTRAMUSCULAR | Status: AC
  Filled 2021-12-19: qty 2

## 2021-12-19 MED ORDER — MIDAZOLAM HCL 2 MG/2ML IJ SOLN
INTRAMUSCULAR | Status: DC | PRN
  Administered 2021-12-19 (×2): 1 mg via INTRAVENOUS

## 2021-12-19 MED ORDER — ASPIRIN 81 MG PO CHEW: 81.0000 mg | CHEWABLE_TABLET | ORAL | Status: DC

## 2021-12-19 MED ORDER — HEPARIN (PORCINE) IN NACL 1000-0.9 UT/500ML-% IV SOLN
INTRAVENOUS | Status: DC | PRN
  Administered 2021-12-19 (×2): 500 mL

## 2021-12-19 MED ORDER — HYDRALAZINE HCL 20 MG/ML IJ SOLN: 10.0000 mg | INTRAMUSCULAR | Status: DC | PRN

## 2021-12-19 MED ORDER — ACETAMINOPHEN 325 MG PO TABS: 650.0000 mg | ORAL_TABLET | ORAL | Status: DC | PRN

## 2021-12-19 SURGICAL SUPPLY — 14 items
CATH 5FR JL3.5 JR4 ANG PIG MP (CATHETERS) IMPLANT
CATH SWAN GANZ 7F STRAIGHT (CATHETERS) IMPLANT
DEVICE RAD COMP TR BAND LRG (VASCULAR PRODUCTS) IMPLANT
ELECT DEFIB PAD ADLT CADENCE (PAD) IMPLANT
GLIDESHEATH SLEND SS 6F .021 (SHEATH) IMPLANT
GLIDESHEATH SLENDER 7FR .021G (SHEATH) IMPLANT
GUIDEWIRE INQWIRE 1.5J.035X260 (WIRE) IMPLANT
INQWIRE 1.5J .035X260CM (WIRE) ×1
KIT HEART LEFT (KITS) ×2 IMPLANT
PACK CARDIAC CATHETERIZATION (CUSTOM PROCEDURE TRAY) ×2 IMPLANT
SHEATH PROBE COVER 6X72 (BAG) IMPLANT
SYR MEDRAD MARK 7 150ML (SYRINGE) ×2 IMPLANT
TRANSDUCER W/STOPCOCK (MISCELLANEOUS) ×2 IMPLANT
TUBING CIL FLEX 10 FLL-RA (TUBING) ×2 IMPLANT

## 2021-12-19 NOTE — Progress Notes (Signed)
TR band removed, new dressing along w/ arm brace placed. No s/s of bleeding or hematoma noted. Will continue to monitor.

## 2021-12-19 NOTE — Interval H&P Note (Signed)
History and Physical Interval Note:  12/19/2021 10:08 AM  Mandy Delacruz  has presented today for surgery, with the diagnosis of aortic stenosis.  The various methods of treatment have been discussed with the patient and family. After consideration of risks, benefits and other options for treatment, the patient has consented to  Procedure(s): RIGHT/LEFT HEART CATH AND CORONARY ANGIOGRAPHY (N/A) as a surgical intervention.  The patient's history has been reviewed, patient examined, no change in status, stable for surgery.  I have reviewed the patient's chart and labs.  Questions were answered to the patient's satisfaction.    Cath Lab Visit (complete for each Cath Lab visit)  Clinical Evaluation Leading to the Procedure:   ACS: No.  Non-ACS:    Anginal Classification: CCS II  Anti-ischemic medical therapy: Minimal Therapy (1 class of medications)  Non-Invasive Test Results: No non-invasive testing performed  Prior CABG: No previous CABG        Mandy Delacruz

## 2021-12-19 NOTE — Progress Notes (Signed)
Pt and husband received d/c instructions, written and verbal. Husband was on the phone. All questions and concerns addressed. PIV removed and intact. All procedure sites intact. No bleeding or hematoma noted. Denies any acute pain or discomfort. Pt in stable condition.

## 2021-12-20 ENCOUNTER — Ambulatory Visit (HOSPITAL_COMMUNITY): Payer: BC Managed Care – PPO

## 2021-12-20 ENCOUNTER — Encounter (HOSPITAL_COMMUNITY): Payer: Self-pay | Admitting: Cardiovascular Disease

## 2021-12-24 ENCOUNTER — Ambulatory Visit (HOSPITAL_COMMUNITY)
Admission: RE | Admit: 2021-12-24 | Discharge: 2021-12-24 | Disposition: A | Discharge status: Home / Self Care | Payer: BC Managed Care – PPO | Source: Ambulatory Visit | Attending: Cardiology | Admitting: Cardiology

## 2021-12-24 DIAGNOSIS — Z01810 Encounter for preprocedural cardiovascular examination: Secondary | ICD-10-CM | POA: Diagnosis not present

## 2021-12-24 DIAGNOSIS — I350 Nonrheumatic aortic (valve) stenosis: Secondary | ICD-10-CM | POA: Insufficient documentation

## 2021-12-24 DIAGNOSIS — Z9049 Acquired absence of other specified parts of digestive tract: Secondary | ICD-10-CM | POA: Diagnosis not present

## 2021-12-24 DIAGNOSIS — Z01818 Encounter for other preprocedural examination: Secondary | ICD-10-CM | POA: Diagnosis not present

## 2021-12-24 MED ORDER — IOHEXOL 350 MG/ML SOLN
100.0000 mL | Freq: Once | INTRAVENOUS | Status: AC | PRN
  Administered 2021-12-24: 100 mL via INTRAVENOUS

## 2021-12-29 NOTE — Progress Notes (Unsigned)
[id number]       [place] [address]             [phone]           Mandy Delacruz [facility] Record #[id number] Date of Birth: [birth year]  Mandy Klein, MD [name], MD  Chief Complaint:  Severe AS   History of Present Illness:     Pt is a pleasant 76 yo female who was admitted following a GI bleed with very increased DOE. Work up was found to have small bowel AVMs and on transfusion has had stabilization of her hemoglobin and has no symptoms of DOE unless she pushes herself physically. She has no CP or Lightheadness. She has no lower extremity edema. Echo with normal LV function and severe AS with a mean gradient of 49mHg and a valve area of 0.6cm2. She had no obstructive CAD by cath and CTA measurements favorable for femoral access. Her LM height distance around 945m Her CT also has significant ascending aortic calcifications.       Past Medical History:  Diagnosis Date   Anemia    Aortic stenosis    Asthma    Bronchitis    on bactrin since 12-27-14   Colon polyps 2001   Diverticulosis of colon (without mention of hemorrhage) 2001   Dizziness    inner ear problem at times, saw dr  elMargaretha Sheffieldriffin for   GERD (gastroesophageal reflux disease)    GI bleed    Heart murmur    Hiatal hernia 2001   Hyperlipidemia    Hypertension    IH (inguinal hernia)    left   Lung nodule    Other esophagitis 2001   Tobacco abuse     Past Surgical History:  Procedure Laterality Date   breast biospy     left, benign   CHOLECYSTECTOMY     COLONOSCOPY     COLONOSCOPY WITH PROPOFOL N/A 10/26/2021   Procedure: COLONOSCOPY WITH PROPOFOL;  Surgeon: DaDoran StablerMD;  [facility]: [facility];  Laterality: N/A;   ENDOVENOUS ABLATION SAPHENOUS VEIN W/ LASER Left 04/29/2016   endovenous laser ablation L SSV and stab phlebectomy left leg by JaTinnie GensD    ESOPHAGOGASTRODUODENOSCOPY (EGD) WITH PROPOFOL N/A  10/25/2021   Procedure: ESOPHAGOGASTRODUODENOSCOPY (EGD) WITH PROPOFOL;  Surgeon: DoSharyn CreamerMD;  [facility];  Service: Gastroenterology;  Laterality: N/A;   INGUINAL HERNIA REPAIR  oct 2015   left   INGUINAL HERNIA REPAIR Left 01/10/2015   Procedure: OPEN REPAIR OF RECURRENT LEFT INGUINAL HERNIA ;  Surgeon: ErGreer PickerelMD;  Location: [facility];  Service: General;  Laterality: Left;   INSERTION OF MESH Left 01/10/2015   Procedure: INSERTION OF MESH;  Surgeon: ErGreer PickerelMD;  Location: [facility];  Service: General;  Laterality: Left;   INSERTION OF MESH N/A 08/18/2017   Procedure: INSERTION OF MESH;  Surgeon: WiGreer PickerelMD;  [facility] Service: [facility];  Laterality: N/A;   LAPAROSCOPY N/A 01/10/2015   Procedure: LAPAROSCOPY DIAGNOSTIC;  Surgeon: ErGreer PickerelMD;  Location: [facility];  Service: General;  Laterality: N/A;   ORIF HIP FRACTURE  2006   3 pins, right   RIGHT HEART CATH AND CORONARY ANGIOGRAPHY N/A 12/19/2021   Procedure: RIGHT HEART CATH AND CORONARY ANGIOGRAPHY;  Surgeon: McBurnell BlanksMD;  [facility];  Service: Cardiovascular;  Laterality: N/A;   UMBILICAL HERNIA REPAIR  N/A 08/18/2017   Procedure: LAPAROSCOPIC-ASSISTED UMBILICAL HERNIA  REPAIR ERAS PATHWAY;  Surgeon: Greer Pickerel, MD;  Location: [facility] OR;  Service: General;  Laterality: N/A;    Social History   Tobacco Use  Smoking Status Former   Years: 45.00   Types: Cigarettes  Smokeless Tobacco Never  Tobacco Comments   maybe 5-10 cigarettes a day  starts and stops smoking every few days    Social History   Substance and Sexual Activity  Alcohol Use Yes   Alcohol/week: 0.0 - 1.0 standard drinks of alcohol   Comment: social    Social History   Socioeconomic History   Marital status: Married    Spouse name: Not on file   Number of children: 2   Years of education: Not on file   Highest education level: Not on file  Occupational History   Occupation: part-time  office asst.  Tobacco Use   Smoking status: Former    Years: 45.00    Types: Cigarettes   Smokeless tobacco: Never   Tobacco comments:    maybe 5-10 cigarettes a day  starts and stops smoking every few days  Vaping Use   Vaping Use: Never used  Substance and Sexual Activity   Alcohol use: Yes    Alcohol/week: 0.0 - 1.0 standard drinks of alcohol    Comment: social   Drug use: No   Sexual activity: Not on file  Other Topics Concern   Not on file  Social History Narrative   Not on file   Social Determinants of Health   Financial Resource Strain: Not on file  Food Insecurity: Not on file  Transportation Needs: Not on file  Physical Activity: Not on file  Stress: Not on file  Social Connections: Not on file  Intimate Partner Violence: Not on file    Allergies  Allergen Reactions   Doxycycline Hyclate Rash    Blisters on hands and feet   Ace Inhibitors Cough   Hydrocodone Bit-Homatrop Mbr Nausea Only   Codeine Nausea Only    Current Outpatient Medications  Medication Sig Dispense Refill   metoprolol tartrate (LOPRESSOR) 50 MG tablet Take as directed prior to 12/12 CT scan 1 tablet 0   ACCU-CHEK GUIDE test strip 2 (two) times daily. as directed     acetaminophen (TYLENOL) 650 MG CR tablet Take 1,300 mg by mouth daily as needed for pain.     albuterol (VENTOLIN HFA) 108 (90 Base) MCG/ACT inhaler Inhale 2 puffs into the lungs every 6 (six) hours as needed for wheezing or shortness of breath.     diphenhydrAMINE (BENADRYL) 25 MG tablet Take 25 mg by mouth at bedtime.     ferrous sulfate 325 (65 FE) MG tablet Take 1 tablet (325 mg total) by mouth daily. 30 tablet 3   losartan-hydrochlorothiazide (HYZAAR) 50-12.5 MG tablet Take 0.5 tablets by mouth daily. Take 1/2 tablet by mouth once daily  0   pantoprazole (PROTONIX) 40 MG tablet Take 1 tablet (40 mg total) by mouth daily. 30 tablet 2   pravastatin (PRAVACHOL) 40 MG tablet TAKE 1 TABLET(40 MG) BY MOUTH EVERY EVENING (Patient  taking differently: Take 40 mg by mouth every evening.) 90 tablet 0   SYMBICORT 80-4.5 MCG/ACT inhaler Take 2 puffs by mouth 2 (two) times daily.  11   No current facility-administered medications for this visit.     Family History  Problem Relation Age of Onset   COPD Mother    Hypertension Father    Celiac  disease Other        neice, Sister's daughter   Colon cancer Neg Hx    Esophageal cancer Neg Hx    Rectal cancer Neg Hx    Stomach cancer Neg Hx        Physical Exam: Lungs: clear Card: RR with harsh systolic murmur Ext: no edema Neuro: intact Teeth in good repair        Diagnostic Studies & Laboratory data: I have personally reviewed the following studies and agree with the findings   TTE (11/2021) IMPRESSIONS     1. Left ventricular ejection fraction, by estimation, is 65 to 70%. Left  ventricular ejection fraction by 3D volume is 67 %. The left ventricle has  normal function. The left ventricle has no regional wall motion  abnormalities. There is severe left  ventricular hypertrophy of the basal-septal segment. Left ventricular  diastolic parameters are consistent with Grade I diastolic dysfunction  (impaired relaxation). The average left ventricular global longitudinal  strain is -18.0 %. The global  longitudinal strain is normal.   2. Right ventricular systolic function is normal. The right ventricular  size is normal. There is normal pulmonary artery systolic pressure. The  estimated right ventricular systolic pressure is 82.9 mmHg.   3. Left atrial size was severely dilated.   4. The mitral valve is normal in structure. Mild mitral valve  regurgitation. No evidence of mitral stenosis. Severe mitral annular  calcification.   5. The aortic valve is calcified. There is severe calcifcation of the  aortic valve. There is severe thickening of the aortic valve. Aortic valve  regurgitation is not visualized. Severe aortic valve stenosis. Aortic  valve area,  by VTI measures 0.61 cm.  Aortic valve mean gradient measures 67.0 mmHg. Aortic valve Vmax measures  5.33 m/s. DVI 0.23.   6. There is borderline dilatation of the ascending aorta, measuring 37  mm.   7. The inferior vena cava is normal in size with greater than 50%  respiratory variability, suggesting right atrial pressure of 3 mmHg.   8. Compared to study dated 12/26/2019, there has been progression aortic  stenosis to severe. The mean AVG has increased from 29.28mHg to 639mG, AV  Vmax has increased from 3.7156mto 5.33 m/s, DVI has decreased from 0.42  to 0.23.   FINDINGS   Left Ventricle: Left ventricular ejection fraction, by estimation, is 65  to 70%. Left ventricular ejection fraction by 3D volume is 67 %. The left  ventricle has normal function. The left ventricle has no regional wall  motion abnormalities. The average  left ventricular global longitudinal strain is -18.0 %. The global  longitudinal strain is normal. The left ventricular internal cavity size  was normal in size. There is severe left ventricular hypertrophy of the  basal-septal segment. Left ventricular  diastolic parameters are consistent with Grade I diastolic dysfunction  (impaired relaxation). Normal left ventricular filling pressure.   Right Ventricle: The right ventricular size is normal. No increase in  right ventricular wall thickness. Right ventricular systolic function is  normal. There is normal pulmonary artery systolic pressure. The tricuspid  regurgitant velocity is 2.23 m/s, and   with an assumed right atrial pressure of 3 mmHg, the estimated right  ventricular systolic pressure is 22.93.7Hg.   Left Atrium: Left atrial size was severely dilated.   Right Atrium: Right atrial size was normal in size.   Pericardium: There is no evidence of pericardial effusion.   Mitral Valve: The mitral valve  is normal in structure. Severe mitral  annular calcification. Mild mitral valve regurgitation. No  evidence of  mitral valve stenosis.   Tricuspid Valve: The tricuspid valve is normal in structure. Tricuspid  valve regurgitation is trivial. No evidence of tricuspid stenosis.   Aortic Valve: The aortic valve is calcified. There is severe calcifcation  of the aortic valve. There is severe thickening of the aortic valve.  Aortic valve regurgitation is not visualized. Aortic regurgitation PHT  measures 203 msec. Severe aortic  stenosis is present. Aortic valve mean gradient measures 67.0 mmHg. Aortic  valve peak gradient measures 113.5 mmHg. Aortic valve area, by VTI  measures 0.61 cm.   Pulmonic Valve: The pulmonic valve was normal in structure. Pulmonic valve  regurgitation is not visualized. No evidence of pulmonic stenosis.   Aorta: The aortic root is normal in size and structure. There is  borderline dilatation of the ascending aorta, measuring 37 mm.   Venous: The inferior vena cava is normal in size with greater than 50%  respiratory variability, suggesting right atrial pressure of 3 mmHg.   IAS/Shunts: No atrial level shunt detected by color flow Doppler.   CATH (12/2021) Conclusion  No angiographic evidence of CAD Normal right and left heart pressures (RA 1, RV 23/1/0, PA 23/8 mean 16, PcWP 7)  Recent Radiology Findings:   CTA (12/2021) FINDINGS: Image quality: Excellent.   Noise artifact is: Limited.   Valve Morphology: Tricuspid aortic valve with severe calcifications. Bulky calcification of the NCC/RCC. Severely restricted leaflet motion in systole.   Aortic Valve Calcium score: 2293   Aortic annular dimension:   Phase assessed: 20%   Annular area: 373 mm2   Annular perimeter: 70.0 mm   Max diameter: 24.9 mm   Min diameter: 19.1 mm   Annular and subannular calcification: None.   Membranous septum length: 6.2 mm   Optimal coplanar projection: RAO 28 CAU 10   Coronary Artery Height above Annulus:   Left Main: 8.5 mm   Right Coronary: 12.8  mm   Sinus of Valsalva Measurements:   Non-coronary: 28 mm   Right-coronary: 30 mm   Left-coronary: 27 mm   Sinus of Valsalva Height:   Non-coronary: 14.7 mm   Right-coronary: 17.6 mm   Left-coronary: 16.0 mm   Sinotubular Junction: 25 mm   Ascending Thoracic Aorta: 35 mm   Coronary Arteries: Normal coronary origin. Right dominance. The study was performed without use of NTG and is insufficient for plaque evaluation. Please refer to recent cardiac catheterization for coronary assessment. Mild coronary calcifications.   Cardiac Morphology:   Right Atrium: Right atrial size is within normal limits.   Right Ventricle: The right ventricular cavity is within normal limits.   Left Atrium: Left atrial size is normal in size with no left atrial appendage filling defect. Small PFO.   Left Ventricle: The ventricular cavity size is within normal limits.   Pulmonary arteries: Normal in size without proximal filling defect.   Pulmonary veins: Normal pulmonary venous drainage.   Pericardium: Normal thickness with no significant effusion or calcium present.   Mitral Valve: The mitral valve is degenerative with moderate annular calcium.   Extra-cardiac findings: See attached radiology report for non-cardiac structures.   IMPRESSION: 1. Annular measurements support a 23 mm S3 TAVR or 26 mm Evolut Pro.   2. No significant annular or subannular calcifications.   3. Shallow left main coronary artery height noted. Structural heart team discussion regarding risk of obstruction recommended.    Recent  Lab Findings: Lab Results  Component Value Date   WBC 7.4 12/17/2021   HGB 10.9 (L) 12/19/2021   HCT 32.0 (L) 12/19/2021   PLT 202 12/17/2021   GLUCOSE 87 12/17/2021   ALT 34 10/24/2021   AST 22 10/24/2021   NA 140 12/19/2021   K 3.7 12/19/2021   CL 103 12/17/2021   CREATININE 0.78 12/17/2021   BUN 23 12/17/2021   CO2 25 12/17/2021   TSH 1.51 03/18/2012   HGBA1C 6.0  (H) 10/25/2021      Assessment / Plan:     Pt with NYHA class I-II symptoms of severe AS with normal LV function and no significant CAD. Pt would best be served with AVR now with regards to her symptoms and severity of her AS. Pt age and ascending aortic calcification would favor TAVR and would be a bail out if needed at time of TAVR. She has a LM with shallow height but feel should be ok and will discuss at Structural heart meeting tomorrow with a size 18m Sapien favored due to severe calcification of valve. All of the risks and goals of TAVR were discussed including arterial injury, need for PPM, stroke and death. She understands and wishes to proceed.    I have spent 60 min in review of the records, viewing studies and in face to face with patient and in coordination of future care    PCoralie Common12/17/2023 1:51 PM

## 2021-12-30 ENCOUNTER — Institutional Professional Consult (permissible substitution) (INDEPENDENT_AMBULATORY_CARE_PROVIDER_SITE_OTHER): Payer: BC Managed Care – PPO | Admitting: Thoracic Surgery (Cardiothoracic Vascular Surgery)

## 2021-12-30 ENCOUNTER — Encounter: Payer: Self-pay | Admitting: Thoracic Surgery (Cardiothoracic Vascular Surgery)

## 2021-12-30 VITALS — BP 132/78 | HR 91 | Ht 63.0 in | Wt 137.0 lb

## 2021-12-30 DIAGNOSIS — I350 Nonrheumatic aortic (valve) stenosis: Secondary | ICD-10-CM | POA: Diagnosis not present

## 2021-12-30 NOTE — Progress Notes (Addendum)
Pre Surgical Assessment: 5 M Walk Test  38M=16.64f  5 Meter Walk Test- trial 1: 4.91 seconds 5 Meter Walk Test- trial 2: 7.18 seconds 5 Meter Walk Test- trial 3: 5.10 seconds 5 Meter Walk Test Average: 5.73 seconds    __________________   STS score  Procedure Type: Isolated AVR  Perioperative Outcome Estimate %  Operative Mortality 2.21%  Morbidity & Mortality 6.71%  Stroke 0.81%  Renal Failure 0.843%  Reoperation 3.06%  Prolonged Ventilation 3.63%  Deep Sternal Wound Infection 0.046%  LEunice HospitalStay (>14 days) 3.19%  Short Hospital Stay (<6 days)* 48.7%

## 2021-12-30 NOTE — Patient Instructions (Signed)
TAVR after structural heart meeting discussion

## 2021-12-31 ENCOUNTER — Other Ambulatory Visit: Payer: Self-pay | Admitting: Cardiology

## 2021-12-31 ENCOUNTER — Other Ambulatory Visit: Payer: Self-pay

## 2021-12-31 DIAGNOSIS — I350 Nonrheumatic aortic (valve) stenosis: Secondary | ICD-10-CM

## 2022-01-10 ENCOUNTER — Ambulatory Visit (HOSPITAL_COMMUNITY)
Admission: RE | Admit: 2022-01-10 | Discharge: 2022-01-10 | Disposition: A | Discharge status: Home / Self Care | Payer: BC Managed Care – PPO | Source: Ambulatory Visit | Attending: Internal Medicine | Admitting: Internal Medicine

## 2022-01-10 ENCOUNTER — Encounter (HOSPITAL_COMMUNITY)
Admission: RE | Admit: 2022-01-10 | Discharge: 2022-01-10 | Disposition: A | Discharge status: Home / Self Care | Payer: BC Managed Care – PPO | Source: Ambulatory Visit | Attending: Internal Medicine | Admitting: Internal Medicine

## 2022-01-10 ENCOUNTER — Telehealth: Payer: Self-pay | Admitting: Cardiovascular Disease

## 2022-01-10 DIAGNOSIS — I440 Atrioventricular block, first degree: Secondary | ICD-10-CM | POA: Insufficient documentation

## 2022-01-10 DIAGNOSIS — Z1152 Encounter for screening for COVID-19: Secondary | ICD-10-CM | POA: Diagnosis not present

## 2022-01-10 DIAGNOSIS — I350 Nonrheumatic aortic (valve) stenosis: Secondary | ICD-10-CM | POA: Insufficient documentation

## 2022-01-10 DIAGNOSIS — Z01818 Encounter for other preprocedural examination: Secondary | ICD-10-CM | POA: Insufficient documentation

## 2022-01-10 DIAGNOSIS — J9811 Atelectasis: Secondary | ICD-10-CM | POA: Diagnosis not present

## 2022-01-10 DIAGNOSIS — R9431 Abnormal electrocardiogram [ECG] [EKG]: Secondary | ICD-10-CM | POA: Insufficient documentation

## 2022-01-10 DIAGNOSIS — I700 Atherosclerosis of aorta: Secondary | ICD-10-CM | POA: Diagnosis not present

## 2022-01-10 LAB — URINALYSIS, ROUTINE W REFLEX MICROSCOPIC
Bilirubin Urine: NEGATIVE
Glucose, UA: NEGATIVE mg/dL
Ketones, ur: NEGATIVE mg/dL
Nitrite: NEGATIVE
Protein, ur: NEGATIVE mg/dL
Specific Gravity, Urine: 1.006 (ref 1.005–1.030)
pH: 6 (ref 5.0–8.0)

## 2022-01-10 LAB — CBC
HCT: 37.5 % (ref 36.0–46.0)
Hemoglobin: 12.5 g/dL (ref 12.0–15.0)
MCH: 31.3 pg (ref 26.0–34.0)
MCHC: 33.3 g/dL (ref 30.0–36.0)
MCV: 93.8 fL (ref 80.0–100.0)
Platelets: 228 10*3/uL (ref 150–400)
RBC: 4 MIL/uL (ref 3.87–5.11)
RDW: 15.9 % — ABNORMAL HIGH (ref 11.5–15.5)
WBC: 6.3 10*3/uL (ref 4.0–10.5)
nRBC: 0 % (ref 0.0–0.2)

## 2022-01-10 LAB — TYPE AND SCREEN
ABO/RH(D): A NEG
Antibody Screen: POSITIVE

## 2022-01-10 LAB — PROTIME-INR
INR: 1 (ref 0.8–1.2)
Prothrombin Time: 12.8 seconds (ref 11.4–15.2)

## 2022-01-10 LAB — COMPREHENSIVE METABOLIC PANEL
ALT: 28 U/L (ref 0–44)
AST: 24 U/L (ref 15–41)
Albumin: 3.9 g/dL (ref 3.5–5.0)
Alkaline Phosphatase: 86 U/L (ref 38–126)
Anion gap: 8 (ref 5–15)
BUN: 21 mg/dL (ref 8–23)
CO2: 25 mmol/L (ref 22–32)
Calcium: 10 mg/dL (ref 8.9–10.3)
Chloride: 103 mmol/L (ref 98–111)
Creatinine, Ser: 1 mg/dL (ref 0.44–1.00)
GFR, Estimated: 59 mL/min — ABNORMAL LOW (ref >60)
Glucose, Bld: 106 mg/dL — ABNORMAL HIGH (ref 70–99)
Potassium: 3.9 mmol/L (ref 3.5–5.1)
Sodium: 136 mmol/L (ref 135–145)
Total Bilirubin: 0.4 mg/dL (ref 0.3–1.2)
Total Protein: 7.1 g/dL (ref 6.5–8.1)

## 2022-01-10 LAB — SURGICAL PCR SCREEN
MRSA, PCR: NEGATIVE
Staphylococcus aureus: NEGATIVE

## 2022-01-10 MED ORDER — NOREPINEPHRINE 4 MG/250ML-% IV SOLN
0.0000 ug/min | INTRAVENOUS | Status: DC
  Filled 2022-01-10: qty 250

## 2022-01-10 MED ORDER — POTASSIUM CHLORIDE 2 MEQ/ML IV SOLN
80.0000 meq | INTRAVENOUS | Status: DC
  Filled 2022-01-10 (×2): qty 40

## 2022-01-10 MED ORDER — MAGNESIUM SULFATE 50 % IJ SOLN
40.0000 meq | INTRAMUSCULAR | Status: DC
  Filled 2022-01-10 (×2): qty 9.85

## 2022-01-10 MED ORDER — HEPARIN 30,000 UNITS/1000 ML (OHS) CELLSAVER SOLUTION
Status: DC
  Filled 2022-01-10 (×2): qty 1000

## 2022-01-10 MED ORDER — DEXMEDETOMIDINE HCL IN NACL 400 MCG/100ML IV SOLN
0.1000 ug/kg/h | INTRAVENOUS | Status: DC
  Filled 2022-01-10 (×2): qty 100

## 2022-01-10 MED ORDER — HEPARIN 30,000 UNITS/1000 ML (OHS) CELLSAVER SOLUTION: Status: DC

## 2022-01-10 MED ORDER — CEFAZOLIN SODIUM-DEXTROSE 2-4 GM/100ML-% IV SOLN
2.0000 g | INTRAVENOUS | Status: AC
  Administered 2022-01-14: 2 g via INTRAVENOUS

## 2022-01-10 MED ORDER — DEXMEDETOMIDINE HCL IN NACL 400 MCG/100ML IV SOLN
0.1000 ug/kg/h | INTRAVENOUS | Status: AC
  Administered 2022-01-14: 67.1 ug via INTRAVENOUS
  Administered 2022-01-14: 1 ug/kg/h via INTRAVENOUS
  Filled 2022-01-10: qty 100

## 2022-01-10 MED ORDER — MAGNESIUM SULFATE 50 % IJ SOLN: 40.0000 meq | INTRAMUSCULAR | Status: DC

## 2022-01-10 MED ORDER — NOREPINEPHRINE 4 MG/250ML-% IV SOLN
0.0000 ug/min | INTRAVENOUS | Status: AC
  Administered 2022-01-14: 2 ug/min via INTRAVENOUS

## 2022-01-10 MED ORDER — POTASSIUM CHLORIDE 2 MEQ/ML IV SOLN: 80.0000 meq | INTRAVENOUS | Status: DC

## 2022-01-10 MED ORDER — CEFAZOLIN SODIUM-DEXTROSE 2-4 GM/100ML-% IV SOLN
2.0000 g | INTRAVENOUS | Status: DC
  Filled 2022-01-10: qty 100

## 2022-01-10 NOTE — Telephone Encounter (Signed)
Mandy Delacruz with [facility] calling to notify the office the patient has a positive antibody screen. She says their policy is to cross match 2 units of blood for surgery, but they need to know if more are needed. Phone: [phone]

## 2022-01-10 NOTE — Progress Notes (Signed)
CHG soap given to pt with instructions. Instructions explained and all questions answered

## 2022-01-11 LAB — SARS CORONAVIRUS 2 (TAT 6-24 HRS): SARS Coronavirus 2: NEGATIVE

## 2022-01-13 NOTE — H&P (Signed)
[address]       [place] [address]             [phone]                                   Mandy Delacruz [place] Medical Record #[id number] Date of Birth: 02/02/1945   [name], MD [name], MD   Chief Complaint:  Severe AS    History of Present Illness:     Pt is a pleasant 77 yo female who was admitted following a GI bleed with very increased DOE. Work up was found to have small bowel AVMs and on transfusion has had stabilization of her hemoglobin and has no symptoms of DOE unless she pushes herself physically. She has no CP or Lightheadness. She has no lower extremity edema. Echo with normal LV function and severe AS with a mean gradient of 46mHg and a valve area of 0.6cm2. She had no obstructive CAD by cath and CTA measurements favorable for femoral access. Her LM height distance around 96m Her CT also has significant ascending aortic calcifications.              Past Medical History:  Diagnosis Date   Anemia     Aortic stenosis     Asthma     Bronchitis      on bactrin since 12-27-14   Colon polyps 2001   Diverticulosis of colon (without mention of hemorrhage) 2001   Dizziness      inner ear problem at times, saw dr  elMargaretha Sheffieldriffin for   GERD (gastroesophageal reflux disease)     GI bleed     Heart murmur     Hiatal hernia 2001   Hyperlipidemia     Hypertension     IH (inguinal hernia)      left   Lung nodule     Other esophagitis 2001   Tobacco abuse             Past Surgical History:  Procedure Laterality Date   breast biospy        left, benign   CHOLECYSTECTOMY       COLONOSCOPY       COLONOSCOPY WITH PROPOFOL N/A 10/26/2021    Procedure: COLONOSCOPY WITH PROPOFOL;  Surgeon: DaDoran StablerMD;  [facility]: [facility];  Laterality: N/A;   ENDOVENOUS ABLATION SAPHENOUS VEIN W/ LASER Left 04/29/2016    endovenous laser ablation L SSV and stab phlebectomy left leg by JaTinnie GensD    ESOPHAGOGASTRODUODENOSCOPY (EGD) WITH PROPOFOL N/A 10/25/2021    Procedure: ESOPHAGOGASTRODUODENOSCOPY (EGD) WITH PROPOFOL;  Surgeon: DoSharyn CreamerMD;  [facility];  Service: Gastroenterology;  Laterality: N/A;   INGUINAL HERNIA REPAIR   oct 2015    left   INGUINAL HERNIA REPAIR Left 01/10/2015    Procedure: OPEN REPAIR OF RECURRENT LEFT INGUINAL HERNIA ;  Surgeon: ErGreer PickerelMD;  Location: [facility];  Service: General;  Laterality: Left;   INSERTION OF MESH Left 01/10/2015    Procedure: INSERTION OF MESH;  Surgeon: ErGreer PickerelMD;  Location: [facility];  Service: General;  Laterality: Left;   INSERTION OF MESH N/A 08/18/2017    Procedure: INSERTION OF MESH;  Surgeon: WiGreer PickerelMD;  [facility] Service: [facility];  Laterality: N/A;   LAPAROSCOPY N/A 01/10/2015  Procedure: LAPAROSCOPY DIAGNOSTIC;  Surgeon: Greer Pickerel, MD;  Location: [facility];  Service: General;  Laterality: N/A;   ORIF HIP FRACTURE   2006    3 pins, right   RIGHT HEART CATH AND CORONARY ANGIOGRAPHY N/A 12/19/2021    Procedure: RIGHT HEART CATH AND CORONARY ANGIOGRAPHY;  Surgeon: Burnell Blanks, MD;  Location: [facility];  Service: Cardiovascular;  Laterality: N/A;   UMBILICAL HERNIA REPAIR N/A 08/18/2017    Procedure: LAPAROSCOPIC-ASSISTED UMBILICAL HERNIA  REPAIR ERAS PATHWAY;  Surgeon: Greer Pickerel, MD;  [facility];  Service: General;  Laterality: N/A;      Social History        Tobacco Use  Smoking Status Former   Years: 45.00   Types: Cigarettes  Smokeless Tobacco Never  Tobacco Comments    maybe 5-10 cigarettes a day  starts and stops smoking every few days    Social History        Substance and Sexual Activity  Alcohol Use Yes   Alcohol/week: 0.0 - 1.0 standard drinks of alcohol    Comment: social      Social History         Socioeconomic History   Marital status: Married      Spouse name: Not on file   Number of children: 2   Years of education:  Not on file   Highest education level: Not on file  Occupational History   Occupation: part-time office asst.  Tobacco Use   Smoking status: Former      Years: 45.00      Types: Cigarettes   Smokeless tobacco: Never   Tobacco comments:      maybe 5-10 cigarettes a day  starts and stops smoking every few days  Vaping Use   Vaping Use: Never used  Substance and Sexual Activity   Alcohol use: Yes      Alcohol/week: 0.0 - 1.0 standard drinks of alcohol      Comment: social   Drug use: No   Sexual activity: Not on file  Other Topics Concern   Not on file  Social History Narrative   Not on file    Social Determinants of Health    Financial Resource Strain: Not on file  Food Insecurity: Not on file  Transportation Needs: Not on file  Physical Activity: Not on file  Stress: Not on file  Social Connections: Not on file  Intimate Partner Violence: Not on file           Allergies  Allergen Reactions   Doxycycline Hyclate Rash      Blisters on hands and feet   Ace Inhibitors Cough   Hydrocodone Bit-Homatrop Mbr Nausea Only   Codeine Nausea Only            Current Outpatient Medications  Medication Sig Dispense Refill   metoprolol tartrate (LOPRESSOR) 50 MG tablet Take as directed prior to 12/12 CT scan 1 tablet 0   ACCU-CHEK GUIDE test strip 2 (two) times daily. as directed       acetaminophen (TYLENOL) 650 MG CR tablet Take 1,300 mg by mouth daily as needed for pain.       albuterol (VENTOLIN HFA) 108 (90 Base) MCG/ACT inhaler Inhale 2 puffs into the lungs every 6 (six) hours as needed for wheezing or shortness of breath.       diphenhydrAMINE (BENADRYL) 25 MG tablet Take 25 mg by mouth at bedtime.       ferrous sulfate 325 (  65 FE) MG tablet Take 1 tablet (325 mg total) by mouth daily. 30 tablet 3   losartan-hydrochlorothiazide (HYZAAR) 50-12.5 MG tablet Take 0.5 tablets by mouth daily. Take 1/2 tablet by mouth once daily   0   pantoprazole (PROTONIX) 40 MG tablet Take 1  tablet (40 mg total) by mouth daily. 30 tablet 2   pravastatin (PRAVACHOL) 40 MG tablet TAKE 1 TABLET(40 MG) BY MOUTH EVERY EVENING (Patient taking differently: Take 40 mg by mouth every evening.) 90 tablet 0   SYMBICORT 80-4.5 MCG/ACT inhaler Take 2 puffs by mouth 2 (two) times daily.   11    No current facility-administered medications for this visit.             Family History  Problem Relation Age of Onset   COPD Mother     Hypertension Father     Celiac disease Other          neice, Sister's daughter   Colon cancer Neg Hx     Esophageal cancer Neg Hx     Rectal cancer Neg Hx     Stomach cancer Neg Hx              Physical Exam: Lungs: clear Card: RR with harsh systolic murmur Ext: no edema Neuro: intact Teeth in good repair           Diagnostic Studies & Laboratory data: I have personally reviewed the following studies and agree with the findings   TTE (11/2021) IMPRESSIONS     1. Left ventricular ejection fraction, by estimation, is 65 to 70%. Left  ventricular ejection fraction by 3D volume is 67 %. The left ventricle has  normal function. The left ventricle has no regional wall motion  abnormalities. There is severe left  ventricular hypertrophy of the basal-septal segment. Left ventricular  diastolic parameters are consistent with Grade I diastolic dysfunction  (impaired relaxation). The average left ventricular global longitudinal  strain is -18.0 %. The global  longitudinal strain is normal.   2. Right ventricular systolic function is normal. The right ventricular  size is normal. There is normal pulmonary artery systolic pressure. The  estimated right ventricular systolic pressure is 67.1 mmHg.   3. Left atrial size was severely dilated.   4. The mitral valve is normal in structure. Mild mitral valve  regurgitation. No evidence of mitral stenosis. Severe mitral annular  calcification.   5. The aortic valve is calcified. There is severe calcifcation  of the  aortic valve. There is severe thickening of the aortic valve. Aortic valve  regurgitation is not visualized. Severe aortic valve stenosis. Aortic  valve area, by VTI measures 0.61 cm.  Aortic valve mean gradient measures 67.0 mmHg. Aortic valve Vmax measures  5.33 m/s. DVI 0.23.   6. There is borderline dilatation of the ascending aorta, measuring 37  mm.   7. The inferior vena cava is normal in size with greater than 50%  respiratory variability, suggesting right atrial pressure of 3 mmHg.   8. Compared to study dated 12/26/2019, there has been progression aortic  stenosis to severe. The mean AVG has increased from 29.39mHg to 646mG, AV  Vmax has increased from 3.7123mto 5.33 m/s, DVI has decreased from 0.42  to 0.23.   FINDINGS   Left Ventricle: Left ventricular ejection fraction, by estimation, is 65  to 70%. Left ventricular ejection fraction by 3D volume is 67 %. The left  ventricle has normal function. The left ventricle has no regional  wall  motion abnormalities. The average  left ventricular global longitudinal strain is -18.0 %. The global  longitudinal strain is normal. The left ventricular internal cavity size  was normal in size. There is severe left ventricular hypertrophy of the  basal-septal segment. Left ventricular  diastolic parameters are consistent with Grade I diastolic dysfunction  (impaired relaxation). Normal left ventricular filling pressure.   Right Ventricle: The right ventricular size is normal. No increase in  right ventricular wall thickness. Right ventricular systolic function is  normal. There is normal pulmonary artery systolic pressure. The tricuspid  regurgitant velocity is 2.23 m/s, and   with an assumed right atrial pressure of 3 mmHg, the estimated right  ventricular systolic pressure is 67.8 mmHg.   Left Atrium: Left atrial size was severely dilated.   Right Atrium: Right atrial size was normal in size.   Pericardium: There is no  evidence of pericardial effusion.   Mitral Valve: The mitral valve is normal in structure. Severe mitral  annular calcification. Mild mitral valve regurgitation. No evidence of  mitral valve stenosis.   Tricuspid Valve: The tricuspid valve is normal in structure. Tricuspid  valve regurgitation is trivial. No evidence of tricuspid stenosis.   Aortic Valve: The aortic valve is calcified. There is severe calcifcation  of the aortic valve. There is severe thickening of the aortic valve.  Aortic valve regurgitation is not visualized. Aortic regurgitation PHT  measures 203 msec. Severe aortic  stenosis is present. Aortic valve mean gradient measures 67.0 mmHg. Aortic  valve peak gradient measures 113.5 mmHg. Aortic valve area, by VTI  measures 0.61 cm.   Pulmonic Valve: The pulmonic valve was normal in structure. Pulmonic valve  regurgitation is not visualized. No evidence of pulmonic stenosis.   Aorta: The aortic root is normal in size and structure. There is  borderline dilatation of the ascending aorta, measuring 37 mm.   Venous: The inferior vena cava is normal in size with greater than 50%  respiratory variability, suggesting right atrial pressure of 3 mmHg.   IAS/Shunts: No atrial level shunt detected by color flow Doppler.    CATH (12/2021) Conclusion   No angiographic evidence of CAD Normal right and left heart pressures (RA 1, RV 23/1/0, PA 23/8 mean 16, PcWP 7)  Recent Radiology Findings:   CTA (12/2021) FINDINGS: Image quality: Excellent.   Noise artifact is: Limited.   Valve Morphology: Tricuspid aortic valve with severe calcifications. Bulky calcification of the NCC/RCC. Severely restricted leaflet motion in systole.   Aortic Valve Calcium score: 2293   Aortic annular dimension:   Phase assessed: 20%   Annular area: 373 mm2   Annular perimeter: 70.0 mm   Max diameter: 24.9 mm   Min diameter: 19.1 mm   Annular and subannular calcification: None.    Membranous septum length: 6.2 mm   Optimal coplanar projection: RAO 28 CAU 10   Coronary Artery Height above Annulus:   Left Main: 8.5 mm   Right Coronary: 12.8 mm   Sinus of Valsalva Measurements:   Non-coronary: 28 mm   Right-coronary: 30 mm   Left-coronary: 27 mm   Sinus of Valsalva Height:   Non-coronary: 14.7 mm   Right-coronary: 17.6 mm   Left-coronary: 16.0 mm   Sinotubular Junction: 25 mm   Ascending Thoracic Aorta: 35 mm   Coronary Arteries: Normal coronary origin. Right dominance. The study was performed without use of NTG and is insufficient for plaque evaluation. Please refer to recent cardiac catheterization for coronary assessment.  Mild coronary calcifications.   Cardiac Morphology:   Right Atrium: Right atrial size is within normal limits.   Right Ventricle: The right ventricular cavity is within normal limits.   Left Atrium: Left atrial size is normal in size with no left atrial appendage filling defect. Small PFO.   Left Ventricle: The ventricular cavity size is within normal limits.   Pulmonary arteries: Normal in size without proximal filling defect.   Pulmonary veins: Normal pulmonary venous drainage.   Pericardium: Normal thickness with no significant effusion or calcium present.   Mitral Valve: The mitral valve is degenerative with moderate annular calcium.   Extra-cardiac findings: See attached radiology report for non-cardiac structures.   IMPRESSION: 1. Annular measurements support a 23 mm S3 TAVR or 26 mm Evolut Pro.   2. No significant annular or subannular calcifications.   3. Shallow left main coronary artery height noted. Structural heart team discussion regarding risk of obstruction recommended.     Recent Lab Findings: Recent Labs       Lab Results  Component Value Date    WBC 7.4 12/17/2021    HGB 10.9 (L) 12/19/2021    HCT 32.0 (L) 12/19/2021    PLT 202 12/17/2021    GLUCOSE 87 12/17/2021    ALT 34  10/24/2021    AST 22 10/24/2021    NA 140 12/19/2021    K 3.7 12/19/2021    CL 103 12/17/2021    CREATININE 0.78 12/17/2021    BUN 23 12/17/2021    CO2 25 12/17/2021    TSH 1.51 03/18/2012    HGBA1C 6.0 (H) 10/25/2021            Assessment / Plan:     Pt with NYHA class I-II symptoms of severe AS with normal LV function and no significant CAD. Pt would best be served with AVR now with regards to her symptoms and severity of her AS. Pt age and ascending aortic calcification would favor TAVR and would be a bail out if needed at time of TAVR. She has a LM with shallow height but feel should be ok and will discuss at Structural heart meeting tomorrow with a size 61m Sapien favored due to severe calcification of valve. All of the risks and goals of TAVR were discussed including arterial injury, need for PPM, stroke and death. She understands and wishes to proceed.

## 2022-01-14 ENCOUNTER — Inpatient Hospital Stay (HOSPITAL_COMMUNITY)
Admission: RE | Admit: 2022-01-14 | Discharge: 2022-01-15 | DRG: 267 | Disposition: A | Discharge status: Home / Self Care | Payer: BC Managed Care – PPO | Attending: Thoracic Surgery (Cardiothoracic Vascular Surgery) | Admitting: Thoracic Surgery (Cardiothoracic Vascular Surgery)

## 2022-01-14 ENCOUNTER — Inpatient Hospital Stay (HOSPITAL_COMMUNITY): Payer: BC Managed Care – PPO | Admitting: Certified Registered"

## 2022-01-14 ENCOUNTER — Other Ambulatory Visit: Payer: Self-pay | Admitting: Physician Assistant

## 2022-01-14 ENCOUNTER — Inpatient Hospital Stay (HOSPITAL_COMMUNITY): Payer: BC Managed Care – PPO | Admitting: Physician Assistant

## 2022-01-14 ENCOUNTER — Encounter (HOSPITAL_COMMUNITY): Payer: Self-pay | Admitting: Internal Medicine

## 2022-01-14 ENCOUNTER — Other Ambulatory Visit: Payer: Self-pay

## 2022-01-14 ENCOUNTER — Inpatient Hospital Stay (HOSPITAL_COMMUNITY): Payer: BC Managed Care – PPO

## 2022-01-14 ENCOUNTER — Encounter (HOSPITAL_COMMUNITY)
Admission: RE | Disposition: A | Discharge status: Home / Self Care | Payer: Self-pay | Source: Home / Self Care | Attending: Thoracic Surgery (Cardiothoracic Vascular Surgery)

## 2022-01-14 DIAGNOSIS — K5520 Angiodysplasia of colon without hemorrhage: Secondary | ICD-10-CM | POA: Diagnosis not present

## 2022-01-14 DIAGNOSIS — K219 Gastro-esophageal reflux disease without esophagitis: Secondary | ICD-10-CM | POA: Diagnosis present

## 2022-01-14 DIAGNOSIS — Z881 Allergy status to other antibiotic agents status: Secondary | ICD-10-CM

## 2022-01-14 DIAGNOSIS — Z7951 Long term (current) use of inhaled steroids: Secondary | ICD-10-CM

## 2022-01-14 DIAGNOSIS — R001 Bradycardia, unspecified: Secondary | ICD-10-CM

## 2022-01-14 DIAGNOSIS — Z87891 Personal history of nicotine dependence: Secondary | ICD-10-CM

## 2022-01-14 DIAGNOSIS — D509 Iron deficiency anemia, unspecified: Secondary | ICD-10-CM | POA: Diagnosis not present

## 2022-01-14 DIAGNOSIS — Z720 Tobacco use: Secondary | ICD-10-CM | POA: Diagnosis present

## 2022-01-14 DIAGNOSIS — J4489 Other specified chronic obstructive pulmonary disease: Secondary | ICD-10-CM | POA: Diagnosis present

## 2022-01-14 DIAGNOSIS — I440 Atrioventricular block, first degree: Secondary | ICD-10-CM | POA: Diagnosis not present

## 2022-01-14 DIAGNOSIS — Z888 Allergy status to other drugs, medicaments and biological substances status: Secondary | ICD-10-CM

## 2022-01-14 DIAGNOSIS — R310 Gross hematuria: Secondary | ICD-10-CM | POA: Diagnosis not present

## 2022-01-14 DIAGNOSIS — Z825 Family history of asthma and other chronic lower respiratory diseases: Secondary | ICD-10-CM

## 2022-01-14 DIAGNOSIS — E119 Type 2 diabetes mellitus without complications: Secondary | ICD-10-CM | POA: Diagnosis present

## 2022-01-14 DIAGNOSIS — J45909 Unspecified asthma, uncomplicated: Secondary | ICD-10-CM | POA: Diagnosis not present

## 2022-01-14 DIAGNOSIS — Z885 Allergy status to narcotic agent status: Secondary | ICD-10-CM | POA: Diagnosis not present

## 2022-01-14 DIAGNOSIS — Z952 Presence of prosthetic heart valve: Secondary | ICD-10-CM

## 2022-01-14 DIAGNOSIS — Z8249 Family history of ischemic heart disease and other diseases of the circulatory system: Secondary | ICD-10-CM | POA: Diagnosis not present

## 2022-01-14 DIAGNOSIS — Z006 Encounter for examination for normal comparison and control in clinical research program: Secondary | ICD-10-CM | POA: Diagnosis not present

## 2022-01-14 DIAGNOSIS — Z79899 Other long term (current) drug therapy: Secondary | ICD-10-CM | POA: Diagnosis not present

## 2022-01-14 DIAGNOSIS — I10 Essential (primary) hypertension: Secondary | ICD-10-CM | POA: Diagnosis present

## 2022-01-14 DIAGNOSIS — J42 Unspecified chronic bronchitis: Secondary | ICD-10-CM | POA: Diagnosis present

## 2022-01-14 DIAGNOSIS — E782 Mixed hyperlipidemia: Secondary | ICD-10-CM | POA: Diagnosis present

## 2022-01-14 DIAGNOSIS — T45515A Adverse effect of anticoagulants, initial encounter: Secondary | ICD-10-CM | POA: Diagnosis not present

## 2022-01-14 DIAGNOSIS — I350 Nonrheumatic aortic (valve) stenosis: Secondary | ICD-10-CM

## 2022-01-14 DIAGNOSIS — I119 Hypertensive heart disease without heart failure: Secondary | ICD-10-CM | POA: Diagnosis not present

## 2022-01-14 DIAGNOSIS — D500 Iron deficiency anemia secondary to blood loss (chronic): Secondary | ICD-10-CM | POA: Diagnosis present

## 2022-01-14 HISTORY — PX: TRANSCATHETER AORTIC VALVE REPLACEMENT, TRANSFEMORAL: SHX6400

## 2022-01-14 HISTORY — PX: INTRAOPERATIVE TRANSTHORACIC ECHOCARDIOGRAM: SHX6523

## 2022-01-14 HISTORY — PX: CORONARY ANGIOGRAPHY: CATH118303

## 2022-01-14 HISTORY — DX: Nonrheumatic aortic (valve) stenosis: I35.0

## 2022-01-14 LAB — POCT I-STAT, CHEM 8
BUN: 28 mg/dL — ABNORMAL HIGH (ref 8–23)
BUN: 24 mg/dL — ABNORMAL HIGH (ref 8–23)
Calcium, Ion: 1.37 mmol/L (ref 1.15–1.40)
Calcium, Ion: 1.31 mmol/L (ref 1.15–1.40)
Chloride: 105 mmol/L (ref 98–111)
Chloride: 107 mmol/L (ref 98–111)
Creatinine, Ser: 0.9 mg/dL (ref 0.44–1.00)
Creatinine, Ser: 0.8 mg/dL (ref 0.44–1.00)
Glucose, Bld: 120 mg/dL — ABNORMAL HIGH (ref 70–99)
Glucose, Bld: 127 mg/dL — ABNORMAL HIGH (ref 70–99)
HCT: 34 % — ABNORMAL LOW (ref 36.0–46.0)
HCT: 30 % — ABNORMAL LOW (ref 36.0–46.0)
Hemoglobin: 11.6 g/dL — ABNORMAL LOW (ref 12.0–15.0)
Hemoglobin: 10.2 g/dL — ABNORMAL LOW (ref 12.0–15.0)
Potassium: 4 mmol/L (ref 3.5–5.1)
Potassium: 3.9 mmol/L (ref 3.5–5.1)
Sodium: 139 mmol/L (ref 135–145)
Sodium: 139 mmol/L (ref 135–145)
TCO2: 24 mmol/L (ref 22–32)
TCO2: 22 mmol/L (ref 22–32)

## 2022-01-14 LAB — GLUCOSE, CAPILLARY
Glucose-Capillary: 140 mg/dL — ABNORMAL HIGH (ref 70–99)
Glucose-Capillary: 128 mg/dL — ABNORMAL HIGH (ref 70–99)
Glucose-Capillary: 139 mg/dL — ABNORMAL HIGH (ref 70–99)

## 2022-01-14 LAB — ECHOCARDIOGRAM LIMITED
AR max vel: 7.47 cm2
AV Area VTI: 3.43 cm2
AV Area mean vel: 3.68 cm2
AV Mean grad: 8 mmHg
AV Peak grad: 3.5 mmHg
Ao pk vel: 0.94 m/s

## 2022-01-14 SURGERY — IMPLANTATION, AORTIC VALVE, TRANSCATHETER, FEMORAL APPROACH
Anesthesia: Monitor Anesthesia Care

## 2022-01-14 MED ORDER — PANTOPRAZOLE SODIUM 40 MG PO TBEC
40.0000 mg | DELAYED_RELEASE_TABLET | Freq: Every day | ORAL | Status: DC
  Administered 2022-01-14: 40 mg via ORAL
  Filled 2022-01-14: qty 1

## 2022-01-14 MED ORDER — SODIUM CHLORIDE 0.9% FLUSH: 3.0000 mL | INTRAVENOUS | Status: DC | PRN

## 2022-01-14 MED ORDER — CEFAZOLIN SODIUM-DEXTROSE 2-4 GM/100ML-% IV SOLN
2.0000 g | Freq: Three times a day (TID) | INTRAVENOUS | Status: AC
  Administered 2022-01-14 – 2022-01-15 (×2): 2 g via INTRAVENOUS
  Filled 2022-01-14 (×2): qty 100

## 2022-01-14 MED ORDER — INSULIN ASPART 100 UNIT/ML IJ SOLN: 0.0000 [IU] | INTRAMUSCULAR | Status: DC | PRN

## 2022-01-14 MED ORDER — IOHEXOL 350 MG/ML SOLN
INTRAVENOUS | Status: DC | PRN
  Administered 2022-01-14: 250 mL

## 2022-01-14 MED ORDER — INSULIN ASPART 100 UNIT/ML IJ SOLN
0.0000 [IU] | Freq: Three times a day (TID) | INTRAMUSCULAR | Status: DC
  Administered 2022-01-14 – 2022-01-15 (×2): 2 [IU] via SUBCUTANEOUS

## 2022-01-14 MED ORDER — HEPARIN (PORCINE) IN NACL 1000-0.9 UT/500ML-% IV SOLN
INTRAVENOUS | Status: AC
  Filled 2022-01-14: qty 500

## 2022-01-14 MED ORDER — LIDOCAINE HCL (PF) 1 % IJ SOLN
INTRAMUSCULAR | Status: DC | PRN
  Administered 2022-01-14 (×2): 10 mL
  Administered 2022-01-14: 5 mL
  Administered 2022-01-14: 2 mL

## 2022-01-14 MED ORDER — DIPHENHYDRAMINE HCL 25 MG PO CAPS
25.0000 mg | ORAL_CAPSULE | Freq: Every day | ORAL | Status: DC
  Administered 2022-01-14: 25 mg via ORAL
  Filled 2022-01-14: qty 1

## 2022-01-14 MED ORDER — LOSARTAN POTASSIUM 25 MG PO TABS
25.0000 mg | ORAL_TABLET | Freq: Every day | ORAL | Status: DC
  Administered 2022-01-14 – 2022-01-15 (×2): 25 mg via ORAL
  Filled 2022-01-14 (×2): qty 1

## 2022-01-14 MED ORDER — SODIUM CHLORIDE 0.9 % IV SOLN
INTRAVENOUS | Status: AC
  Administered 2022-01-14: 50 mL/h via INTRAVENOUS

## 2022-01-14 MED ORDER — FERROUS SULFATE 325 (65 FE) MG PO TABS
325.0000 mg | ORAL_TABLET | Freq: Every day | ORAL | Status: DC
  Administered 2022-01-14 – 2022-01-15 (×2): 325 mg via ORAL
  Filled 2022-01-14 (×2): qty 1

## 2022-01-14 MED ORDER — VERAPAMIL HCL 2.5 MG/ML IV SOLN
INTRAVENOUS | Status: DC | PRN
  Administered 2022-01-14: 10 mL via INTRA_ARTERIAL

## 2022-01-14 MED ORDER — SODIUM CHLORIDE 0.9% FLUSH
3.0000 mL | Freq: Two times a day (BID) | INTRAVENOUS | Status: DC
  Administered 2022-01-14 – 2022-01-15 (×2): 3 mL via INTRAVENOUS

## 2022-01-14 MED ORDER — LORATADINE 10 MG PO TABS
10.0000 mg | ORAL_TABLET | Freq: Every day | ORAL | Status: DC
  Filled 2022-01-14 (×2): qty 1

## 2022-01-14 MED ORDER — FENTANYL CITRATE (PF) 100 MCG/2ML IJ SOLN
INTRAMUSCULAR | Status: DC | PRN
  Administered 2022-01-14: 25 ug via INTRAVENOUS

## 2022-01-14 MED ORDER — ASPIRIN 81 MG PO CHEW
81.0000 mg | CHEWABLE_TABLET | Freq: Every day | ORAL | Status: DC
  Administered 2022-01-15: 81 mg via ORAL
  Filled 2022-01-14: qty 1

## 2022-01-14 MED ORDER — HEPARIN SODIUM (PORCINE) 1000 UNIT/ML IJ SOLN
INTRAMUSCULAR | Status: DC | PRN
  Administered 2022-01-14: 9000 [IU] via INTRAVENOUS
  Administered 2022-01-14: 4000 [IU] via INTRAVENOUS

## 2022-01-14 MED ORDER — HYDROCHLOROTHIAZIDE 12.5 MG PO TABS
6.2500 mg | ORAL_TABLET | Freq: Every day | ORAL | Status: DC
  Administered 2022-01-14 – 2022-01-15 (×2): 6.25 mg via ORAL
  Filled 2022-01-14 (×2): qty 1

## 2022-01-14 MED ORDER — CHLORHEXIDINE GLUCONATE 4 % EX LIQD
30.0000 mL | CUTANEOUS | Status: DC
  Filled 2022-01-14: qty 30

## 2022-01-14 MED ORDER — NITROGLYCERIN IN D5W 200-5 MCG/ML-% IV SOLN
0.0000 ug/min | INTRAVENOUS | Status: DC
  Filled 2022-01-14: qty 250

## 2022-01-14 MED ORDER — PRAVASTATIN SODIUM 40 MG PO TABS
40.0000 mg | ORAL_TABLET | Freq: Every day | ORAL | Status: DC
  Administered 2022-01-14: 40 mg via ORAL
  Filled 2022-01-14: qty 1

## 2022-01-14 MED ORDER — CHLORHEXIDINE GLUCONATE 4 % EX LIQD
60.0000 mL | Freq: Once | CUTANEOUS | Status: DC
  Filled 2022-01-14: qty 60

## 2022-01-14 MED ORDER — SODIUM CHLORIDE 0.9 % IV SOLN: 250.0000 mL | INTRAVENOUS | Status: DC | PRN

## 2022-01-14 MED ORDER — ONDANSETRON HCL 4 MG/2ML IJ SOLN: 4.0000 mg | Freq: Four times a day (QID) | INTRAMUSCULAR | Status: DC | PRN

## 2022-01-14 MED ORDER — EPHEDRINE SULFATE-NACL 50-0.9 MG/10ML-% IV SOSY
PREFILLED_SYRINGE | INTRAVENOUS | Status: DC | PRN
  Administered 2022-01-14 (×3): 5 mg via INTRAVENOUS

## 2022-01-14 MED ORDER — HEPARIN (PORCINE) IN NACL 1000-0.9 UT/500ML-% IV SOLN
INTRAVENOUS | Status: DC | PRN
  Administered 2022-01-14 (×3): 500 mL

## 2022-01-14 MED ORDER — ACETAMINOPHEN 650 MG RE SUPP: 650.0000 mg | Freq: Four times a day (QID) | RECTAL | Status: DC | PRN

## 2022-01-14 MED ORDER — PROTAMINE SULFATE 10 MG/ML IV SOLN
INTRAVENOUS | Status: DC | PRN
  Administered 2022-01-14: 25 mg via INTRAVENOUS
  Administered 2022-01-14: 10 mg via INTRAVENOUS
  Administered 2022-01-14 (×2): 25 mg via INTRAVENOUS
  Administered 2022-01-14: 15 mg via INTRAVENOUS

## 2022-01-14 MED ORDER — PROPOFOL 500 MG/50ML IV EMUL
INTRAVENOUS | Status: DC | PRN
  Administered 2022-01-14: 20 ug/kg/min via INTRAVENOUS

## 2022-01-14 MED ORDER — MOMETASONE FURO-FORMOTEROL FUM 100-5 MCG/ACT IN AERO
2.0000 | INHALATION_SPRAY | Freq: Two times a day (BID) | RESPIRATORY_TRACT | Status: DC
  Administered 2022-01-14 – 2022-01-15 (×2): 2 via RESPIRATORY_TRACT
  Filled 2022-01-14: qty 8.8

## 2022-01-14 MED ORDER — LACTATED RINGERS IV SOLN: INTRAVENOUS | Status: DC

## 2022-01-14 MED ORDER — LIDOCAINE HCL (PF) 1 % IJ SOLN
INTRAMUSCULAR | Status: AC
  Filled 2022-01-14: qty 30

## 2022-01-14 MED ORDER — SODIUM CHLORIDE 0.9 % IV SOLN: INTRAVENOUS | Status: DC

## 2022-01-14 MED ORDER — SODIUM CHLORIDE 0.9% FLUSH
3.0000 mL | Freq: Two times a day (BID) | INTRAVENOUS | Status: DC
  Administered 2022-01-14: 3 mL via INTRAVENOUS

## 2022-01-14 MED ORDER — CHLORHEXIDINE GLUCONATE 0.12 % MT SOLN
15.0000 mL | Freq: Once | OROMUCOSAL | Status: DC
  Filled 2022-01-14: qty 15

## 2022-01-14 MED ORDER — OXYCODONE HCL 5 MG PO TABS: 5.0000 mg | ORAL_TABLET | ORAL | Status: DC | PRN

## 2022-01-14 MED ORDER — LOSARTAN POTASSIUM-HCTZ 50-12.5 MG PO TABS: 0.5000 | ORAL_TABLET | Freq: Every day | ORAL | Status: DC

## 2022-01-14 MED ORDER — ACETAMINOPHEN 325 MG PO TABS: 650.0000 mg | ORAL_TABLET | Freq: Four times a day (QID) | ORAL | Status: DC | PRN

## 2022-01-14 SURGICAL SUPPLY — 42 items
BAG SNAP BAND KOVER 36X36 (MISCELLANEOUS) ×4 IMPLANT
BALLN TRUE 22X4.5 (BALLOONS) ×1
BALLOON TRUE 22X4.5 (BALLOONS) IMPLANT
CABLE ADAPT PACING TEMP 12FT (ADAPTER) IMPLANT
CABLE SURGICAL S-101-97-12 (CABLE) IMPLANT
CATH 23 ULTRA DELIVERY (CATHETERS) IMPLANT
CATH DIAG 6FR PIGTAIL ANGLED (CATHETERS) IMPLANT
CATH INFINITI 6F AL1 (CATHETERS) IMPLANT
CATH LAUNCHER 6FR JL4 (CATHETERS) IMPLANT
CATH TELESCOPE 6F GEC (CATHETERS) IMPLANT
CLOSURE MYNX CONTROL 6F/7F (Vascular Products) IMPLANT
CLOSURE PERCLOSE PROSTYLE (VASCULAR PRODUCTS) IMPLANT
CRIMPER (MISCELLANEOUS) IMPLANT
DEVICE INFLATION ATRION QL2530 (MISCELLANEOUS) IMPLANT
DEVICE RAD COMP TR BAND LRG (VASCULAR PRODUCTS) IMPLANT
ELECT DEFIB PAD ADLT CADENCE (PAD) IMPLANT
GLIDESHEATH SLEND SS 6F .021 (SHEATH) IMPLANT
GUIDEWIRE SAFE TJ AMPLATZ EXST (WIRE) IMPLANT
GUIDEWIRE VAS SION BLUE 190 (WIRE) IMPLANT
KIT ENCORE 26 ADVANTAGE (KITS) IMPLANT
KIT HEART LEFT (KITS) ×2 IMPLANT
KIT SAPIAN 3 ULTRA RESILIA 23 (Valve) IMPLANT
PACK CARDIAC CATHETERIZATION (CUSTOM PROCEDURE TRAY) ×2 IMPLANT
PROTECTION STATION PRESSURIZED (MISCELLANEOUS) ×1
SHEATH INTRODUCER SET 20-26 (SHEATH) IMPLANT
SHEATH PINNACLE 6F 10CM (SHEATH) IMPLANT
SHEATH PINNACLE 8F 10CM (SHEATH) IMPLANT
SHEATH PROBE COVER 6X72 (BAG) IMPLANT
SLEEVE REPOSITIONING LENGTH 30 (MISCELLANEOUS) IMPLANT
STATION PROTECTION PRESSURIZED (MISCELLANEOUS) IMPLANT
STOPCOCK MORSE 400PSI 3WAY (MISCELLANEOUS) ×4 IMPLANT
SYR MEDRAD MARK V 150ML (SYRINGE) IMPLANT
TRANSDUCER W/STOPCOCK (MISCELLANEOUS) ×4 IMPLANT
TUBING CONTRAST HIGH PRESS 48 (TUBING) IMPLANT
VALVE GUARDIAN II ~~LOC~~ HEMO (MISCELLANEOUS) IMPLANT
WIRE AMPLATZ SS-J .035X180CM (WIRE) IMPLANT
WIRE EMERALD 3MM-J .035X150CM (WIRE) IMPLANT
WIRE EMERALD 3MM-J .035X260CM (WIRE) IMPLANT
WIRE EMERALD ST .035X260CM (WIRE) IMPLANT
WIRE MICRO SET SILHO 5FR 7 (SHEATH) IMPLANT
WIRE PACING TEMP ST TIP 5 (CATHETERS) IMPLANT
WIRE SAFARI SM CURVE 275 (WIRE) IMPLANT

## 2022-01-14 NOTE — Op Note (Signed)
[facility]   MULTIDISCIPLINARY HEART VALVE TEAM   TAVR OPERATIVE NOTE   Date of Procedure:  01/14/2022  Preoperative Diagnosis: Severe Aortic Stenosis   Postoperative Diagnosis: Same   Procedure:   Transcatheter Aortic Valve Replacement - Percutaneous right Transfemoral Approach  Edwards Sapien 3 Ultra THV (size 23 mm, model # 9750RSL, serial # [id number])   Co-Surgeons:  Coralie Common MD and Lenna Sciara, MD   Anesthesiologist:  Laurie Panda MD  Echocardiographer:  [name] MD  Pre-operative Echo Findings: Severe aortic stenosis normal left ventricular systolic function  Post-operative Echo Findings: no paravalvular leak normal left ventricular systolic function   BRIEF CLINICAL NOTE AND INDICATIONS FOR SURGERY   Pt with NYHA class I-II symptoms of severe AS with normal LV function and no significant CAD. Pt would best be served with AVR now with regards to her symptoms and severity of her AS. Pt age and ascending aortic calcification would favor TAVR and would be a bail out if needed at time of TAVR. She has a LM with shallow height but feel should be ok and will discuss at Structural heart meeting tomorrow with a size 34m Sapien favored due to severe calcification of valve.     DETAILS OF THE OPERATIVE PROCEDURE  PREPARATION:    The patient was brought to the operating room on the above mentioned date and appropriate monitoring was established by the anesthesia team. The patient was placed in the supine position on the operating table.  Intravenous antibiotics were administered. The patient was monitored closely throughout the procedure under conscious sedation.   Baseline transthoracic echocardiogram was performed. The patient's abdomen and both groins were prepped and draped in a sterile manner. A time out procedure was performed.   PERIPHERAL ACCESS:    Using the modified Seldinger technique, femoral arterial and venous access was obtained  with placement of 6 Fr sheaths on the left side and in the left radial artery.  A pigtail diagnostic catheter was passed through the radial  arterial sheath under fluoroscopic guidance into the aortic root.  A temporary transvenous pacemaker catheter was passed through the right jugular venous sheath under fluoroscopic guidance into the right ventricle.  The pacemaker was tested to ensure stable lead placement and pacemaker capture. Aortic root angiography was performed in order to determine the optimal angiographic angle for valve deployment.   TRANSFEMORAL ACCESS:   Percutaneous transfemoral access and sheath placement was performed using ultrasound guidance.  The right common femoral artery was cannulated using a micropuncture needle and appropriate location was verified using hand injection angiogram.  A pair of Abbott Perclose percutaneous closure devices were placed and a 6 French sheath replaced into the femoral artery.  The patient was heparinized systemically and ACT verified > 250 seconds.    A 14 Fr transfemoral E-sheath was introduced into the right common femoral artery after progressively dilating over an Amplatz superstiff wire. An AL1 catheter was used to direct a straight-tip exchange length wire across the native aortic valve into the left ventricle. This was exchanged out for a pigtail catheter and position was confirmed in the LV apex. Simultaneous LV and Ao pressures were recorded.  The pigtail catheter was exchanged for a Safari wire in the LV apex.   BALLOON AORTIC VALVULOPLASTY:   Balloon aortic valvuloplasty was performed using a 22 mm valvuloplasty balloon.  Once optimal position was achieved, BAV was done under rapid ventricular pacing. The patient recovered well hemodynamically. The concern over her low  ostial LM was noted and therefore protection of the LM was performed by Dr Ali Lowe by advancing a 35m x 234mstent into the LAD (this will be discussed in more detail by Dr  ThAli Lowe  TRANSCATHETER HEART VALVE DEPLOYMENT:   An Edwards Sapien 3 Ultra transcatheter heart valve (size 23 mm) was prepared and crimped per manufacturer's guidelines, and the proper orientation of the valve is confirmed on the [employer]elivery system. The valve was advanced through the introducer sheath using normal technique until in an appropriate position in the abdominal aorta beyond the sheath tip. The balloon was then retracted and using the fine-tuning wheel was centered on the valve. The valve was then advanced across the aortic arch using appropriate flexion of the catheter. The valve was carefully positioned across the aortic valve annulus. The Commander catheter was retracted using normal technique. Once final position of the valve has been confirmed by angiographic assessment, the valve is deployed during rapid ventricular pacing to maintain systolic blood pressure < 50 mmHg and pulse pressure < 10 mmHg. The balloon inflation is held for >3 seconds after reaching full deployment volume. Once the balloon has fully deflated the balloon is retracted into the ascending aorta and valve function is assessed using echocardiography. There is felt to be no paravalvular leak and no central aortic insufficiency.  The patient's hemodynamic recovery following valve deployment is good.  The deployment balloon and guidewire are both removed. The flow in the LM was confirmed by aortography and the stent and guidesheaths were removed. Secondary to the pt having evidence of heart block, the temp pacing wire in the RV was maintained.    PROCEDURE COMPLETION:   The sheath was removed and femoral artery closure performed.  Protamine was administered once femoral arterial repair was complete.  A Mynx femoral closure device was utilized following removal of the diagnostic sheath in the left femoral artery after lower arteriogram of the right iliac confirmed adequate closure of the Right femoral  artery.  The patient tolerated the procedure well and is transported to the cath lab recovery area in stable condition. There were no immediate intraoperative complications. All sponge instrument and needle counts are verified correct at completion of the operation.   No blood products were administered during the operation.  The patient received a total of 120 mL of intravenous contrast during the procedure.   PaCoralie CommonMD 01/14/2022 12:39 PM

## 2022-01-14 NOTE — Transfer of Care (Signed)
Immediate Anesthesia Transfer of Care Note  Patient: Mandy Delacruz  Procedure(s) Performed: Transcatheter Aortic Valve Replacement, Transfemoral INTRAOPERATIVE TRANSTHORACIC ECHOCARDIOGRAM CORONARY ANGIOGRAPHY  Patient Location: Cath Lab  Anesthesia Type:MAC  Level of Consciousness: awake, alert , and oriented  Airway & Oxygen Therapy: Patient Spontanous Breathing  Post-op Assessment: Report given to RN and Post -op Vital signs reviewed and stable  Post vital signs: Reviewed and stable  Last Vitals:  Vitals Value Taken Time  BP 128/57 01/14/22 1300  Temp    Pulse 73 01/14/22 1302  Resp 15 01/14/22 1302  SpO2 94 % 01/14/22 1302  Vitals shown include unvalidated device data.  Last Pain:  Vitals:   01/14/22 1301  TempSrc:   PainSc: 0-No pain      Patients Stated Pain Goal: 0 (60/04/59 9774)  Complications: There were no known notable events for this encounter.

## 2022-01-14 NOTE — Anesthesia Procedure Notes (Signed)
Procedure Name: MAC Date/Time: 01/14/2022 10:00 AM  Performed by: Griffin Dakin, CRNAPre-anesthesia Checklist: Patient identified, Emergency Drugs available, Suction available, Patient being monitored and Timeout performed Patient Re-evaluated:Patient Re-evaluated prior to induction Oxygen Delivery Method: Simple face mask Preoxygenation: Pre-oxygenation with 100% oxygen Induction Type: IV induction Ventilation: Oral airway inserted - appropriate to patient size Placement Confirmation: positive ETCO2 and breath sounds checked- equal and bilateral Dental Injury: Teeth and Oropharynx as per pre-operative assessment

## 2022-01-14 NOTE — Progress Notes (Signed)
TR BAND REMOVAL  LOCATION:    Right radial  DEFLATED PER PROTOCOL:    Yes.    TIME BAND OFF / DRESSING APPLIED:    1600 pm a clean dry dressing applied with tegaderm and gauze.   SITE UPON ARRIVAL:    Level 0  SITE AFTER BAND REMOVAL:    Level 0  CIRCULATION SENSATION AND MOVEMENT:    Within Normal Limits   Yes.    COMMENTS:   Care instruction given to patient.

## 2022-01-14 NOTE — Anesthesia Preprocedure Evaluation (Signed)
Anesthesia Evaluation  Patient identified by MRN, date of birth, ID band Patient awake    Reviewed: Allergy & Precautions, NPO status , Patient's Chart, lab work & pertinent test results  History of Anesthesia Complications Negative for: history of anesthetic complications  Airway Mallampati: II  TM Distance: >3 FB Neck ROM: Full    Dental  (+) Dental Advisory Given   Pulmonary asthma , former smoker   breath sounds clear to auscultation       Cardiovascular hypertension, Pt. on medications and Pt. on home beta blockers + Valvular Problems/Murmurs  Rhythm:Regular   1. Left ventricular ejection fraction, by estimation, is 65 to 70%. Left  ventricular ejection fraction by 3D volume is 67 %. The left ventricle has  normal function. The left ventricle has no regional wall motion  abnormalities. There is severe left  ventricular hypertrophy of the basal-septal segment. Left ventricular  diastolic parameters are consistent with Grade I diastolic dysfunction  (impaired relaxation). The average left ventricular global longitudinal  strain is -18.0 %. The global  longitudinal strain is normal.   2. Right ventricular systolic function is normal. The right ventricular  size is normal. There is normal pulmonary artery systolic pressure. The  estimated right ventricular systolic pressure is 16.1 mmHg.   3. Left atrial size was severely dilated.   4. The mitral valve is normal in structure. Mild mitral valve  regurgitation. No evidence of mitral stenosis. Severe mitral annular  calcification.   5. The aortic valve is calcified. There is severe calcifcation of the  aortic valve. There is severe thickening of the aortic valve. Aortic valve  regurgitation is not visualized. Severe aortic valve stenosis. Aortic  valve area, by VTI measures 0.61 cm.  Aortic valve mean gradient measures 67.0 mmHg. Aortic valve Vmax measures  5.33 m/s. DVI 0.23.    6. There is borderline dilatation of the ascending aorta, measuring 37  mm.   7. The inferior vena cava is normal in size with greater than 50%  respiratory variability, suggesting right atrial pressure of 3 mmHg.   8. Compared to study dated 12/26/2019, there has been progression aortic  stenosis to severe. The mean AVG has increased from 29.83mHg to 635mG, AV  Vmax has increased from 3.7175mto 5.33 m/s, DVI has decreased from 0.42  to 0.23.     Neuro/Psych  Neuromuscular disease    GI/Hepatic Neg liver ROS, hiatal hernia,GERD  Medicated,,  Endo/Other  diabetes  Lab Results      Component                Value               Date                      HGBA1C                   6.0 (H)             10/25/2021             Renal/GU negative Renal ROSLab Results      Component                Value               Date                      CREATININE  1.00                01/10/2022                Musculoskeletal   Abdominal   Peds  Hematology Lab Results      Component                Value               Date                      WBC                      6.3                 01/10/2022                HGB                      12.5                01/10/2022                HCT                      37.5                01/10/2022                MCV                      93.8                01/10/2022                PLT                      228                 01/10/2022              Anesthesia Other Findings   Reproductive/Obstetrics                             Anesthesia Physical Anesthesia Plan  ASA: 4  Anesthesia Plan: MAC   Post-op Pain Management: Minimal or no pain anticipated   Induction: Intravenous  PONV Risk Score and Plan: 2 and Propofol infusion and Treatment may vary due to age or medical condition  Airway Management Planned: Nasal Cannula, Natural Airway and Simple Face Mask  Additional Equipment: Arterial  line  Intra-op Plan:   Post-operative Plan:   Informed Consent: I have reviewed the patients History and Physical, chart, labs and discussed the procedure including the risks, benefits and alternatives for the proposed anesthesia with the patient or authorized representative who has indicated his/her understanding and acceptance.     Dental advisory given  Plan Discussed with: CRNA  Anesthesia Plan Comments:         Anesthesia Quick Evaluation

## 2022-01-14 NOTE — Progress Notes (Signed)
Echocardiogram 2D Echocardiogram has been performed.  Mandy Delacruz 01/14/2022, 12:17 PM

## 2022-01-14 NOTE — Op Note (Signed)
[facility]  [facility]R OPERATIVE NOTE   Date of Procedure:  01/14/2022  Preoperative Diagnosis: Severe Aortic Stenosis   Postoperative Diagnosis: Same   Procedure:   Transcatheter Aortic Valve Replacement - Transfemoral Approach  Edwards Sapien 3 Resilia THV (size 23 mm, model # 9755RLS, serial # [id number] )   Co-Surgeons:  Coralie Common, MD and Lenna Sciara, MD Anesthesiologist:  Oleta Mouse, MD  Echocardiographer:  [name], MD  Pre-operative Echo Findings: Severe aortic stenosis Normal left ventricular systolic function  Post-operative Echo Findings: No paravalvular leak Normal left ventricular systolic function  Left Heart Catheterization Findings: Left ventricular end-diastolic pressure of [id number]  Coronary Angiography Findings: Mild luminal irregularities   BRIEF CLINICAL NOTE AND INDICATIONS FOR SURGERY  The patient is a 77 year old female with a history of severe symptomatic aortic stenosis, iron deficiency anemia, GERD, type 2 diabetes, COPD who was referred today for elective transcatheter valve replacement.  Due to a low-lying left main procedure was planned with coronary protection.  During the course of the patient's preoperative work up they have been evaluated comprehensively by a multidisciplinary team of specialists coordinated through the [facility] in the [facility].  They have been demonstrated to suffer from symptomatic severe aortic stenosis as noted above. The patient has been counseled extensively as to the relative risks and benefits of all options for the treatment of severe aortic stenosis including long term medical therapy, conventional surgery for aortic valve replacement, and transcatheter aortic valve replacement.  The patient has been independently evaluated by Dr. Lavonna Monarch with CT surgery and they are felt to be at high risk for conventional surgical aortic valve replacement.  The surgeon indicated the patient would be a poor candidate for conventional surgery. Based upon review of all of the patient's preoperative diagnostic tests they are felt to be candidate for transcatheter aortic valve replacement using the transfemoral approach as an alternative to high risk conventional surgery.    Following the decision to proceed with transcatheter aortic valve replacement, a discussion has been held regarding what types of management strategies would be attempted intraoperatively in the event of life-threatening complications, including whether or not the patient would be considered a candidate for the use of cardiopulmonary bypass and/or conversion to open sternotomy for attempted surgical intervention.  The patient has been advised of a variety of complications that might develop peculiar to this approach including but not limited to risks of death, stroke, paravalvular leak, aortic dissection or other major vascular complications, aortic annulus rupture, device embolization, cardiac rupture or perforation, acute myocardial infarction, arrhythmia, heart block or bradycardia requiring permanent pacemaker placement, congestive heart failure, respiratory failure, renal failure, pneumonia, infection, other late complications related to structural valve deterioration or migration, or other complications that might ultimately cause a temporary or permanent loss of functional independence or other long term morbidity.  The patient provides full informed consent for the procedure as described and all questions were answered preoperatively.    DETAILS OF THE OPERATIVE PROCEDURE  PREPARATION:   The patient is brought to the operating room on the above mentioned date and central monitoring was established by the anesthesia team including placement of a radial arterial line. The patient is placed in the supine position on the operating table.  Intravenous antibiotics are administered. Conscious  sedation is used.   Baseline transthoracic echocardiogram was performed. The patient's chest, abdomen, both groins, and both lower extremities are prepared and draped in a sterile manner.  A time out procedure is performed.   PERIPHERAL ACCESS:   Using the modified Seldinger technique, femoral arterial and venous access were obtained with placement of a 6 Fr sheath in the artery and a 7 Fr sheath in the vein on the left side using u/s guidance.  A pigtail diagnostic catheter was passed through the femoral arterial sheath under fluoroscopic guidance into the aortic root.  Right internal jugular venous access was also obtained with ultrasound guidance.  A temporary transvenous pacemaker catheter was passed through the internal jugular venous sheath under fluoroscopic guidance into the right ventricle.  The pacemaker was tested to ensure stable lead placement and pacemaker capture.  Left radial access was obtained and the pigtail was maneuvered through this access for aortography.  Aortic root angiography was performed in order to determine the optimal angiographic angle for valve deployment.  TRANSFEMORAL ACCESS:  A micropuncture kit was used to gain access to the right femoral artery using u/s guidance. Position confirmed with angiography. Pre-closure with double ProGlide closure devices. The patient was heparinized systemically and ACT verified > 250 seconds.    A 16 Fr transfemoral E-sheath was introduced into the right femoral artery after progressively dilating over an Amplatz superstiff wire. An AL-1 catheter was used to direct a straight-tip exchange length wire across the native aortic valve into the left ventricle. This was exchanged out for a pigtail catheter and position was confirmed in the LV apex. Simultaneous left ventricular, aortic, and left ventricular end-diastolic pressures were recorded.  The pigtail catheter was then exchanged for an Safari wire in the LV apex.  Direct LV pacing  thresholds were assessed and found to be adequate.   BALLOON AORTIC VALVULOPLASTY: A 22 mm true balloon was used for balloon aortic valvuloplasty to evaluate coronary flow.  Under rapid pacing, a pulmonary valvuloplasty was performed, then dye injected.  We saw no coronary flow and therefore protection of the left main artery was pursued.  CORONARY ANGIOGRAPHY/LEFT MAIN PROTECTION: A 6 [place] guiding catheter was used to cannulate the the left main from the left femoral arterial access site.  Angiography demonstrated minimal luminal irregularities.  After confirming an ACT of greater than 250 send the wire was advanced to the distal aspect of the LAD and a guide extension was advanced into the proximal aspect of the LAD.  A 4.0 by 26 mm Synergy XD stent was positioned in the mid LAD and the guide was pulled back to the ascending aorta.  TRANSCATHETER HEART VALVE DEPLOYMENT:  An Edwards Sapien 3 THV (size 23 mm) was prepared and crimped per manufacturer's guidelines, and the proper orientation of the valve is confirmed on the [employer] delivery system. The valve was advanced through the introducer sheath using normal technique until in an appropriate position in the abdominal aorta beyond the sheath tip. The balloon was then retracted and using the fine-tuning wheel was centered on the valve. The valve was then advanced across the aortic arch using appropriate flexion of the catheter. The valve was carefully positioned across the aortic valve annulus. The Commander catheter was retracted using normal technique. Once final position of the valve has been confirmed by angiographic assessment, the valve is deployed while temporarily holding ventilation and during rapid ventricular pacing to maintain systolic blood pressure < 50 mmHg and pulse pressure < 10 mmHg. The balloon inflation is held for >3 seconds after reaching full deployment volume. Once the balloon has fully deflated the balloon is  retracted into the ascending  aorta and valve function is assessed using TTE. There is felt to be no paravalvular leak and no central aortic insufficiency.  The patient's hemodynamic recovery following valve deployment is good.  The deployment balloon and guidewire are both removed. Echo demostrated acceptable post-procedural gradients, stable mitral valve function, and no  AI.   We then advanced the JL4 guide back into the ostium of the left main.  Coronary angiography was performed which showed an unchanged appearance.  The guide, guide liner, wire, and undeployed stent were then removed from the body.  The patient did develop some bradycardia.  We advanced a pigtail back into the aortic root and aortography was performed which demonstrated coronary flow.  The temporary pacemaker in the right IJ will be left in place and the patient will be transferred to 2 heart unit for overnight monitoring.  PROCEDURE COMPLETION:  The sheath was then removed and closure devices were completed. Protamine was administered once femoral arterial repair was complete. The temporary pacemaker, pigtail catheters and femoral sheaths were removed with a Mynx closure device placed in the artery and manual pressure used for venous hemostasis.    The patient tolerated the procedure well and is transported to the surgical intensive care in stable condition. There were no immediate intraoperative complications. All sponge instrument and needle counts are verified correct at completion of the operation.   No blood products were administered during the operation.  The patient received a total of 250 mL of intravenous contrast during the procedure.  [name] MD 01/14/2022 12:33 PM

## 2022-01-14 NOTE — Discharge Summary (Addendum)
[facility]   MULTIDISCIPLINARY HEART VALVE TEAM  Discharge Summary    Patient ID: Mandy Delacruz MRN: [id number]; DOB: 12-Apr-1945  Admit date: 01/14/2022 Discharge date: 01/15/2022  Primary Care Provider: Irven Coe, MD  Primary Cardiologist: Thurmon Fair, MD / Dr. Lynnette Caffey & Dr. Leafy Ro (TAVR)  Discharge Diagnoses    Principal Problem:   S/P TAVR (transcatheter aortic valve replacement) Active Problems:   Tobacco abuse   Type 2 diabetes mellitus without complication, without long-term current use of insulin (HCC)   Chronic bronchitis (HCC)   First degree AV block   Mixed hyperlipidemia   Essential hypertension   Anemia due to chronic blood loss   Severe aortic stenosis   Allergies Allergies  Allergen Reactions   Doxycycline Hyclate Rash    Blisters on hands and feet   Ace Inhibitors Cough   Hydrocodone Bit-Homatrop Mbr Nausea Only   Codeine Nausea Only    Diagnostic Studies/Procedures    TAVR OPERATIVE NOTE     Date of Procedure:                01/14/2022   Preoperative Diagnosis:      Severe Aortic Stenosis    Postoperative Diagnosis:    Same    Procedure:        Transcatheter Aortic Valve Replacement - Percutaneous right Transfemoral Approach             Edwards Sapien 3 Ultra THV (size 23 mm, model # 9750RSL, serial # [id number])              Co-Surgeons:                        Eugenio Hoes MD and Alverda Skeans, MD     Anesthesiologist:                  Corky Sox MD   Echocardiographer:              Thurmon Fair MD   Pre-operative Echo Findings: Severe aortic stenosis normal left ventricular systolic function   Post-operative Echo Findings: no paravalvular leak normal left ventricular systolic function   _____________    Echo 01/15/22: completed but pending formal read at the time of discharge   History of Present Illness     Mandy Delacruz is a 77 y.o. female with a history of hiatal hernia, GERD, DMT2, HTN, history of  smoking with COPD, AVMS with recent GI bleed, IDA and critical AS who presented to [facility] on 01/14/22 for planned TAVR.   She was hospitalized on 10/24/2021 with anemia and melena (hemoglobin as low as 6.7), which led to exertional dyspnea and exertional chest tightness. After receiving a 2-unit PRBC blood transfusion the dyspnea and chest discomfort resolved.  Labs confirmed iron deficiency and hg improved on supplementation. She underwent upper and lower endoscopy that did not clearly show the source of the bleeding.  She had been taking ibuprofen "every single day" for a couple of years.    Repeat echo 12/12/21 showed EF 65%, severe LVH of the basal-septal segment, and critical AS with mean grad 67 mmHg, peak grad 113 mmHg, AVA 0.58 cm2, DVI 0.23, as well as severe MAC. She was referred for structural heart consultation. [facility] 12/19/21 showed no angiographic evidence of CAD as well as normal right and left heart pressures.   The patient has been evaluated by the multidisciplinary valve team and felt to have severe, symptomatic aortic  stenosis and to be a suitable candidate for TAVR, which was set up for 01/14/22.   Hospital Course     Consultants: none   Severe AS: s/p successful TAVR with a 23 mm Edwards Sapien 3 Ultra Resilia THV via the TF approach on 01/14/22. Post operative echo completed but pending formal read. Groin sites are stable. Left groin bled a little last night but stable now. ECG with sinus/old 1st deg AV block but no high grade heart block. Started on a baby Asprin alone. She walked with cardiac rehab with no issues. Plan to discharge home today with close follow up in the outpatient setting.   Sinus brady: she had some bradycardia into 20-30s after TAVR. HR now stable in the 70s with a narrow QRS and pre existing 1st deg AV block. Given low deployment and first deg AV block, plan for Zio AT to rule out late presenting bradyarrhythmias.   IDA with history of GI bleeding: followed closely  by Dr. Myrtie Neither. Hopefully will tolerate a baby aspirin without further issues. Will watch CBC closely.   HTN: BP well controlled. Resume home meds  DMT2: treated with SSI while admitted.   COPD: stable  Hematuria: pre op UA showed moderate hematuria. After TAVR she had gross hematuria and canister of urine bright red. Now resolved. Will refer to urology given history of smoking.  _____________  Discharge Vitals Blood pressure (!) 117/56, pulse 75, temperature 98.6 F (37 C), temperature source Oral, resp. rate (!) 22, height 5\' 3"  (1.6 m), weight 62 kg, SpO2 97 %.  Filed Weights   01/14/22 0654 01/15/22 0418  Weight: 61.7 kg 62 kg     GEN: Well nourished, well developed, in no acute distress HEENT: normal Neck: no JVD or masses Cardiac: RRR; no murmurs, rubs, or gallops,no edema  Respiratory:  clear to auscultation bilaterally, normal work of breathing GI: soft, nontender, nondistended, + BS MS: no deformity or atrophy Skin: warm and dry, no rash.  Groin sites clear without hematoma. Left groin with ecchymosis but soft  Neuro:  Alert and Oriented x 3, Strength and sensation are intact Psych: euthymic mood, full affect   Labs & Radiologic Studies    CBC Recent Labs    01/14/22 1314 01/15/22 0109  WBC  --  9.1  HGB 10.2* 10.1*  HCT 30.0* 30.7*  MCV  --  93.0  PLT  --  153   Basic Metabolic Panel Recent Labs    54/09/81 1314 01/15/22 0109  NA 139 135  K 3.9 4.0  CL 107 104  CO2  --  23  GLUCOSE 127* 110*  BUN 24* 20  CREATININE 0.80 0.91  CALCIUM  --  9.0  MG  --  1.8   Liver Function Tests No results for input(s): "AST", "ALT", "ALKPHOS", "BILITOT", "PROT", "ALBUMIN" in the last 72 hours. No results for input(s): "LIPASE", "AMYLASE" in the last 72 hours. Cardiac Enzymes No results for input(s): "CKTOTAL", "CKMB", "CKMBINDEX", "TROPONINI" in the last 72 hours. BNP Invalid input(s): "POCBNP" D-Dimer No results for input(s): "DDIMER" in the last 72  hours. Hemoglobin A1C No results for input(s): "HGBA1C" in the last 72 hours. Fasting Lipid Panel No results for input(s): "CHOL", "HDL", "LDLCALC", "TRIG", "CHOLHDL", "LDLDIRECT" in the last 72 hours. Thyroid Function Tests No results for input(s): "TSH", "T4TOTAL", "T3FREE", "THYROIDAB" in the last 72 hours.  Invalid input(s): "FREET3" _____________  ECHOCARDIOGRAM LIMITED  Result Date: 01/14/2022    ECHOCARDIOGRAM LIMITED REPORT   Patient Name:  Mandy Delacruz Date of Exam: 01/14/2022 Medical Rec #:  [id number]      Height:       63.0 in Accession #:    [id number]     Weight:       136.0 lb Date of Birth:  1945/09/27     BSA:          1.641 m Patient Age:    76 years       BP:           136/62 mmHg Patient Gender: F              HR:           69 bpm. Exam Location:  Inpatient Procedure: Limited Echo, Limited Color Doppler and Cardiac Doppler Indications:     Aortic Stenosis  History:         Patient has prior history of Echocardiogram examinations, most                  recent 12/12/2021. Aortic Valve Disease; Risk Factors:GERD,                  Dyslipidemia, Hypertension and Current Smoker.  Sonographer:     Eulah Pont RDCS Referring Phys:  [id number] Orbie Pyo Diagnosing Phys: Thurmon Fair MD  Sonographer Comments: 23mm Edwards Sapien Valve placed.  PRE-PROCEDURAL FINDINGS:  Normal left ventricular systolic function. Estimated LVEF 60-65%. There are no regional wall motion abnormalities. Severe calcific aortic stenosis. Trileaflet aortic valve. Peak aortic valve gradient 101 mm Hg, mean gradient 66 mm Hg. Dimensionless obstructive index 0.20, calculated aortic valve area is 0.71 cm (index for BSA 0.43 cm/m). No aortic insufficiency. Triivial mitral insufficiency. No pericardial effusion. POST-PROCEDURAL FINDINGS:  Hyperdynamic left ventricular systolic function. Estimated LVEF 75%. There are no regional wall motion abnormalities. Well-seated TAVR stent-valve. Peak aortic valve gradient 14  mm Hg, mean gradient 8 mm Hg. Dimensionless obstructive index 0.90, calculated aortic valve area is 3.43 cm (index for BSA 2.09 cm/m). Acceleration time 81 ms. There is no aortic insufficiency and no perivalvular leak. No mitral insufficiency. No pericardial effusion.  FINDINGS  Aortic Valve: Aortic valve mean gradient measures 8.0 mmHg. Aortic valve peak gradient measures 3.5 mmHg. Aortic valve area, by VTI measures 3.43 cm. Additional Comments: Spectral Doppler performed. Color Doppler performed.  LEFT VENTRICLE PLAX 2D LVOT diam:     2.30 cm LV SV:         150 LV SV Index:   92 LVOT Area:     4.15 cm  AORTIC VALVE AV Area (Vmax):    7.47 cm AV Area (Vmean):   3.68 cm AV Area (VTI):     3.43 cm AV Vmax:           94.00 cm/s AV Vmean:          123.000 cm/s AV VTI:            0.438 m AV Peak Grad:      3.5 mmHg AV Mean Grad:      8.0 mmHg LVOT Vmax:         169.00 cm/s LVOT Vmean:        109.000 cm/s LVOT VTI:          0.362 m LVOT/AV VTI ratio: 0.83  SHUNTS Systemic VTI:  0.36 m Systemic Diam: 2.30 cm Rachelle Hora Croitoru MD Electronically signed by Thurmon Fair MD Signature Date/Time: 01/14/2022/5:14:35 PM    Final    Structural  Heart Procedure  Result Date: 01/14/2022 1.  Successful transcatheter valve replacement with a 23 mm SAPIEN 3 ultra valve from the right transfemoral approach with coronary protection.  Stenting of the left lesion was not required given preservation of coronary flow.   CARDIAC CATHETERIZATION  Result Date: 01/14/2022 1.  Successful transcatheter valve replacement with a 23 mm SAPIEN 3 ultra valve from the right transfemoral approach with coronary protection.  Stenting of the left lesion was not required given preservation of coronary flow.   DG Chest 2 View  Result Date: 01/12/2022 CLINICAL DATA:  Preoperative evaluation. EXAM: CHEST - 2 VIEW COMPARISON:  October 14, 2021 FINDINGS: The heart size and mediastinal contours are within normal limits. There is moderate severity  calcification of the aortic arch. Mild, diffuse, chronic appearing increased interstitial lung markings are seen. Very mild atelectasis is noted within the left lung base. There is no evidence of a pleural effusion or pneumothorax. Radiopaque surgical clips are seen within the right upper quadrant. Multilevel degenerative changes are seen throughout the thoracic spine. IMPRESSION: Chronic appearing increased interstitial lung markings with very mild left basilar atelectasis. Electronically Signed   By: Aram Candela M.D.   On: 01/12/2022 20:37   CT CORONARY MORPH W/CTA COR W/SCORE W/CA W/CM &/OR WO/CM  Addendum Date: 12/24/2021   ADDENDUM REPORT: 12/24/2021 15:18 CLINICAL DATA:  Severe Aortic Stenosis. EXAM: Cardiac TAVR CT TECHNIQUE: A non-contrast, gated CT scan was obtained with axial slices of 3 mm through the heart for aortic valve calcium scoring. A 90 kV retrospective, gated, contrast cardiac scan was obtained. Gantry rotation speed was 250 msecs and collimation was 0.6 mm. Nitroglycerin was not given. The 3D data set was reconstructed in 5% intervals of the 0-95% of the R-R cycle. Systolic and diastolic phases were analyzed on a dedicated workstation using MPR, MIP, and VRT modes. The patient received 100 cc of contrast. FINDINGS: Image quality: Excellent. Noise artifact is: Limited. Valve Morphology: Tricuspid aortic valve with severe calcifications. Bulky calcification of the NCC/RCC. Severely restricted leaflet motion in systole. Aortic Valve Calcium score: 2293 Aortic annular dimension: Phase assessed: 20% Annular area: 373 mm2 Annular perimeter: 70.0 mm Max diameter: 24.9 mm Min diameter: 19.1 mm Annular and subannular calcification: None. Membranous septum length: 6.2 mm Optimal coplanar projection: RAO 28 CAU 10 Coronary Artery Height above Annulus: Left Main: 8.5 mm Right Coronary: 12.8 mm Sinus of Valsalva Measurements: Non-coronary: 28 mm Right-coronary: 30 mm Left-coronary: 27 mm Sinus of  Valsalva Height: Non-coronary: 14.7 mm Right-coronary: 17.6 mm Left-coronary: 16.0 mm Sinotubular Junction: 25 mm Ascending Thoracic Aorta: 35 mm Coronary Arteries: Normal coronary origin. Right dominance. The study was performed without use of NTG and is insufficient for plaque evaluation. Please refer to recent cardiac catheterization for coronary assessment. Mild coronary calcifications. Cardiac Morphology: Right Atrium: Right atrial size is within normal limits. Right Ventricle: The right ventricular cavity is within normal limits. Left Atrium: Left atrial size is normal in size with no left atrial appendage filling defect. Small PFO. Left Ventricle: The ventricular cavity size is within normal limits. Pulmonary arteries: Normal in size without proximal filling defect. Pulmonary veins: Normal pulmonary venous drainage. Pericardium: Normal thickness with no significant effusion or calcium present. Mitral Valve: The mitral valve is degenerative with moderate annular calcium. Extra-cardiac findings: See attached radiology report for non-cardiac structures. IMPRESSION: 1. Annular measurements support a 23 mm S3 TAVR or 26 mm Evolut Pro. 2. No significant annular or subannular calcifications. 3. Shallow left main coronary  artery height noted. Structural heart team discussion regarding risk of obstruction recommended. 4. Optimal Fluoroscopic Angle for Delivery: RAO 28 CAU 10 Kirby T. Flora Lipps, MD Electronically Signed   By: Lennie Odor M.D.   On: 12/24/2021 15:18   Result Date: 12/24/2021 EXAM: OVER-READ INTERPRETATION  CT CHEST The following report is a limited chest CT over-read performed by radiologist Dr. Trudie Reed of [facility] on 12/24/2021. This over-read does not include interpretation of cardiac or coronary anatomy or pathology. The coronary calcium score and cardiac CTA interpretation by the cardiologist is attached. COMPARISON:  Chest CT 02/19/2021. FINDINGS: Extracardiac findings  will be described separately under dictation for contemporaneously obtained CTA chest, abdomen and pelvis. IMPRESSION: Please see separate dictation for contemporaneously obtained CTA chest, abdomen and pelvis for full description of relevant extracardiac findings. Electronically Signed: By: Trudie Reed M.D. On: 12/24/2021 10:55   CT ANGIO ABDOMEN PELVIS  W &/OR WO CONTRAST  Result Date: 12/24/2021 CLINICAL DATA:  77 year old female with history of severe aortic stenosis. Preprocedural study prior to potential transcatheter aortic valve replacement (TAVR) procedure. EXAM: CT ANGIOGRAPHY CHEST, ABDOMEN AND PELVIS TECHNIQUE: Multidetector CT imaging through the chest, abdomen and pelvis was performed using the standard protocol during bolus administration of intravenous contrast. Multiplanar reconstructed images and MIPs were obtained and reviewed to evaluate the vascular anatomy. RADIATION DOSE REDUCTION: This exam was performed according to the departmental dose-optimization program which includes automated exposure control, adjustment of the mA and/or kV according to patient size and/or use of iterative reconstruction technique. CONTRAST:  OMNIPAQUE IOHEXOL 350 MG/ML SOLN COMPARISON:  Chest CT 02/19/2021. CTA of the abdomen and pelvis 10/25/2021. FINDINGS: CTA CHEST FINDINGS Cardiovascular: Heart size is enlarged with mild concentric left ventricular hypertrophy and left atrial dilatation. There is no significant pericardial fluid, thickening or pericardial calcification. There is aortic atherosclerosis, as well as atherosclerosis of the great vessels of the mediastinum and the coronary arteries, including calcified atherosclerotic plaque in the left main, left anterior descending and right coronary arteries. Severe thickening and calcification of the aortic valve. Moderate calcifications of the mitral annulus. Mediastinum/Lymph Nodes: No pathologically enlarged mediastinal or hilar lymph nodes.  Esophagus is unremarkable in appearance. No axillary lymphadenopathy. Lungs/Pleura: Mild scarring in the inferior segment of the lingula. No acute consolidative airspace disease. No pleural effusions. No suspicious appearing pulmonary nodules or masses are noted. Musculoskeletal/Soft Tissues: There are no aggressive appearing lytic or blastic lesions noted in the visualized portions of the skeleton. CTA ABDOMEN AND PELVIS FINDINGS Hepatobiliary: No suspicious cystic or solid hepatic lesions. No intra or extrahepatic biliary ductal dilatation. Status post cholecystectomy. Pancreas: No pancreatic mass. No pancreatic ductal dilatation. No pancreatic or peripancreatic fluid collections or inflammatory changes. Spleen: Unremarkable. Adrenals/Urinary Tract: Multiple subcentimeter low-attenuation lesions in both kidneys, too small to definitively characterize, but statistically likely to represent tiny cysts (no imaging follow-up recommended). 1.5 cm simple (Bosniak class 1) cysts in the medial aspect of the upper pole of the right kidney (no imaging follow-up recommended). Mild cortical thinning in the lower pole of the left kidney. No suspicious renal lesions. No hydroureteronephrosis. Urinary bladder is unremarkable in appearance. Adreniform thickening of the left adrenal gland, likely reflecting mild adrenal hyperplasia. Right adrenal gland is normal in appearance. Stomach/Bowel: The appearance of the stomach is unremarkable. There is no pathologic dilatation of small bowel or colon. Numerous colonic diverticula are noted, particularly throughout the sigmoid colon, without surrounding inflammatory changes to suggest an acute diverticulitis at this time. The  appendix is not confidently identified and may be surgically absent. Regardless, there are no inflammatory changes noted adjacent to the cecum to suggest the presence of an acute appendicitis at this time. Vascular/Lymphatic: Extensive aortic atherosclerosis with  multifocal fusiform ectasia of the infrarenal abdominal aorta which measures up to 2.5 x 2.1 cm. Vascular findings and measurements pertinent to potential TAVR procedure, as detailed below. No lymphadenopathy noted in the abdomen or pelvis. Reproductive: Uterus and ovaries are atrophic. Coarse calcification in the anterior aspect of the uterine body, likely a small fibroid. Other: No significant volume of ascites.  No pneumoperitoneum. Musculoskeletal: There are no aggressive appearing lytic or blastic lesions noted in the visualized portions of the skeleton. Postoperative changes of ORIF are noted in the right femoral neck where there are 3 cannulated fixation screws traversing a well-healed femoral neck fracture. Dextroscoliosis of the lumbar spine. VASCULAR MEASUREMENTS PERTINENT TO TAVR: AORTA: Minimal Aortic Diameter-13 x 12 mm Severity of Aortic Calcification-moderate to severe RIGHT PELVIS: Right Common Iliac Artery - Minimal Diameter-8.1 x 10.8 mm Tortuosity-moderate Calcification-moderate Right External Iliac Artery - Minimal Diameter-7.1 x 8.1 mm Tortuosity-mild Calcification-mild Right Common Femoral Artery - Minimal Diameter-6.3 x 7.6 mm Tortuosity-mild Calcification-moderate LEFT PELVIS: Left Common Iliac Artery - Minimal Diameter-8.5 x 9.3 mm Tortuosity-mild Calcification-moderate Left External Iliac Artery - Minimal Diameter-6.7 x 6.4 mm Tortuosity-mild Calcification-mild Left Common Femoral Artery - Minimal Diameter-7.6 x 6.4 mm Tortuosity-mild Calcification-mild Review of the MIP images confirms the above findings. IMPRESSION: 1. Vascular findings and measurements pertinent to potential TAVR procedure, as detailed above. 2. Severe thickening and calcification of the aortic valve, compatible with reported clinical history of severe aortic stenosis. 3. Aortic atherosclerosis, in addition to left main and 2 vessel coronary artery disease. 4. Colonic diverticulosis without evidence of acute  diverticulitis at this time. 5. Additional incidental findings, as above. Electronically Signed   By: Trudie Reed M.D.   On: 12/24/2021 11:19   CT ANGIO CHEST AORTA W/CM & OR WO/CM  Result Date: 12/24/2021 CLINICAL DATA:  77 year old female with history of severe aortic stenosis. Preprocedural study prior to potential transcatheter aortic valve replacement (TAVR) procedure. EXAM: CT ANGIOGRAPHY CHEST, ABDOMEN AND PELVIS TECHNIQUE: Multidetector CT imaging through the chest, abdomen and pelvis was performed using the standard protocol during bolus administration of intravenous contrast. Multiplanar reconstructed images and MIPs were obtained and reviewed to evaluate the vascular anatomy. RADIATION DOSE REDUCTION: This exam was performed according to the departmental dose-optimization program which includes automated exposure control, adjustment of the mA and/or kV according to patient size and/or use of iterative reconstruction technique. CONTRAST:  OMNIPAQUE IOHEXOL 350 MG/ML SOLN COMPARISON:  Chest CT 02/19/2021. CTA of the abdomen and pelvis 10/25/2021. FINDINGS: CTA CHEST FINDINGS Cardiovascular: Heart size is enlarged with mild concentric left ventricular hypertrophy and left atrial dilatation. There is no significant pericardial fluid, thickening or pericardial calcification. There is aortic atherosclerosis, as well as atherosclerosis of the great vessels of the mediastinum and the coronary arteries, including calcified atherosclerotic plaque in the left main, left anterior descending and right coronary arteries. Severe thickening and calcification of the aortic valve. Moderate calcifications of the mitral annulus. Mediastinum/Lymph Nodes: No pathologically enlarged mediastinal or hilar lymph nodes. Esophagus is unremarkable in appearance. No axillary lymphadenopathy. Lungs/Pleura: Mild scarring in the inferior segment of the lingula. No acute consolidative airspace disease. No pleural  effusions. No suspicious appearing pulmonary nodules or masses are noted. Musculoskeletal/Soft Tissues: There are no aggressive appearing lytic or blastic lesions noted in  the visualized portions of the skeleton. CTA ABDOMEN AND PELVIS FINDINGS Hepatobiliary: No suspicious cystic or solid hepatic lesions. No intra or extrahepatic biliary ductal dilatation. Status post cholecystectomy. Pancreas: No pancreatic mass. No pancreatic ductal dilatation. No pancreatic or peripancreatic fluid collections or inflammatory changes. Spleen: Unremarkable. Adrenals/Urinary Tract: Multiple subcentimeter low-attenuation lesions in both kidneys, too small to definitively characterize, but statistically likely to represent tiny cysts (no imaging follow-up recommended). 1.5 cm simple (Bosniak class 1) cysts in the medial aspect of the upper pole of the right kidney (no imaging follow-up recommended). Mild cortical thinning in the lower pole of the left kidney. No suspicious renal lesions. No hydroureteronephrosis. Urinary bladder is unremarkable in appearance. Adreniform thickening of the left adrenal gland, likely reflecting mild adrenal hyperplasia. Right adrenal gland is normal in appearance. Stomach/Bowel: The appearance of the stomach is unremarkable. There is no pathologic dilatation of small bowel or colon. Numerous colonic diverticula are noted, particularly throughout the sigmoid colon, without surrounding inflammatory changes to suggest an acute diverticulitis at this time. The appendix is not confidently identified and may be surgically absent. Regardless, there are no inflammatory changes noted adjacent to the cecum to suggest the presence of an acute appendicitis at this time. Vascular/Lymphatic: Extensive aortic atherosclerosis with multifocal fusiform ectasia of the infrarenal abdominal aorta which measures up to 2.5 x 2.1 cm. Vascular findings and measurements pertinent to potential TAVR procedure, as detailed below. No  lymphadenopathy noted in the abdomen or pelvis. Reproductive: Uterus and ovaries are atrophic. Coarse calcification in the anterior aspect of the uterine body, likely a small fibroid. Other: No significant volume of ascites.  No pneumoperitoneum. Musculoskeletal: There are no aggressive appearing lytic or blastic lesions noted in the visualized portions of the skeleton. Postoperative changes of ORIF are noted in the right femoral neck where there are 3 cannulated fixation screws traversing a well-healed femoral neck fracture. Dextroscoliosis of the lumbar spine. VASCULAR MEASUREMENTS PERTINENT TO TAVR: AORTA: Minimal Aortic Diameter-13 x 12 mm Severity of Aortic Calcification-moderate to severe RIGHT PELVIS: Right Common Iliac Artery - Minimal Diameter-8.1 x 10.8 mm Tortuosity-moderate Calcification-moderate Right External Iliac Artery - Minimal Diameter-7.1 x 8.1 mm Tortuosity-mild Calcification-mild Right Common Femoral Artery - Minimal Diameter-6.3 x 7.6 mm Tortuosity-mild Calcification-moderate LEFT PELVIS: Left Common Iliac Artery - Minimal Diameter-8.5 x 9.3 mm Tortuosity-mild Calcification-moderate Left External Iliac Artery - Minimal Diameter-6.7 x 6.4 mm Tortuosity-mild Calcification-mild Left Common Femoral Artery - Minimal Diameter-7.6 x 6.4 mm Tortuosity-mild Calcification-mild Review of the MIP images confirms the above findings. IMPRESSION: 1. Vascular findings and measurements pertinent to potential TAVR procedure, as detailed above. 2. Severe thickening and calcification of the aortic valve, compatible with reported clinical history of severe aortic stenosis. 3. Aortic atherosclerosis, in addition to left main and 2 vessel coronary artery disease. 4. Colonic diverticulosis without evidence of acute diverticulitis at this time. 5. Additional incidental findings, as above. Electronically Signed   By: Trudie Reed M.D.   On: 12/24/2021 11:19   CARDIAC CATHETERIZATION  Result Date: 12/19/2021 No  angiographic evidence of CAD Normal right and left heart pressures (RA 1, RV 23/1/0, PA 23/8 mean 16, PcWP 7) Recommendations: Continue workup for TAVR.   DG Abd 1 View  Result Date: 12/17/2021 CLINICAL DATA:  Possible retained capsule from camera endoscopy. EXAM: ABDOMEN - 1 VIEW COMPARISON:  None Available. FINDINGS: The bowel gas pattern is normal. A moderate amount of retained stool is noted in the colon. No radio-opaque calculi. Surgical clips are noted in the right  upper quadrant. Vascular calcifications are seen in the pelvis. There is a stable coarse calcification in the pelvis, possible degenerating uterine fibroid. Fixation hardware is noted in the proximal right femur. Degenerative changes in the lumbar spine with dextroscoliosis. IMPRESSION: 1. No evidence of radiopaque foreign body. 2. Moderate amount of retained stool in the colon suggesting constipation. Electronically Signed   By: Thornell Sartorius M.D.   On: 12/17/2021 03:29   Disposition   Pt is being discharged home today in good condition.  Follow-up Plans & Appointments     Follow-up Information     Janetta Hora, PA-C Follow up on 01/24/2022.   Specialties: Cardiology, Radiology Why: @ 10am, please arrive at least 10 min early Contact information: [address] [place] [address] [phone]                Discharge Instructions     Amb Referral to Cardiac Rehabilitation   Complete by: As directed    Diagnosis: Valve Replacement   Valve: Aortic   After initial evaluation and assessments completed: Virtual Based Care may be provided alone or in conjunction with Phase 2 Cardiac Rehab based on patient barriers.: Yes   Intensive Cardiac Rehabilitation (ICR) [facility] location only OR Traditional Cardiac Rehabilitation (TCR) *If criteria for ICR are not met will enroll in TCR [facility] only): Yes       Discharge Medications   Allergies as of 01/15/2022       Reactions   Doxycycline Hyclate Rash    Blisters on hands and feet   Ace Inhibitors Cough   Hydrocodone Bit-homatrop Mbr Nausea Only   Codeine Nausea Only        Medication List     TAKE these medications    Accu-Chek Guide test strip Generic drug: glucose blood 2 (two) times daily. as directed   acetaminophen 650 MG CR tablet Commonly known as: TYLENOL Take 1,300 mg by mouth every 8 (eight) hours as needed for pain.   albuterol 108 (90 Base) MCG/ACT inhaler Commonly known as: VENTOLIN HFA Inhale 2 puffs into the lungs every 6 (six) hours as needed for wheezing or shortness of breath.   aspirin 81 MG chewable tablet Chew 1 tablet (81 mg total) by mouth daily. Okay to swallow whole. Start taking on: January 16, 2022   diphenhydrAMINE 25 MG tablet Commonly known as: BENADRYL Take 25 mg by mouth at bedtime.   ferrous sulfate 325 (65 FE) MG tablet Take 1 tablet (325 mg total) by mouth daily.   fexofenadine 180 MG tablet Commonly known as: ALLEGRA Take 180 mg by mouth daily as needed for allergies or rhinitis.   losartan-hydrochlorothiazide 50-12.5 MG tablet Commonly known as: HYZAAR Take 0.5 tablets by mouth daily.   metoprolol tartrate 50 MG tablet Commonly known as: LOPRESSOR Take as directed prior to 12/12 CT scan   pantoprazole 40 MG tablet Commonly known as: Protonix Take 1 tablet (40 mg total) by mouth daily. What changed: when to take this   pravastatin 40 MG tablet Commonly known as: PRAVACHOL TAKE 1 TABLET(40 MG) BY MOUTH EVERY EVENING   Symbicort 80-4.5 MCG/ACT inhaler Generic drug: budesonide-formoterol Take 2 puffs by mouth 2 (two) times daily.            Outstanding Labs/Studies  CBC  Duration of Discharge Encounter   Greater than 30 minutes including physician time.  Byrd Hesselbach, PA-C 01/15/2022, 9:58 AM [phone]

## 2022-01-14 NOTE — Anesthesia Procedure Notes (Signed)
Arterial Line Insertion Start/End1/02/2022 7:45 AM, 01/14/2022 8:00 AM Performed by: Oleta Mouse, MD, Griffin Dakin, CRNA, CRNA  Patient location: Pre-op. Preanesthetic checklist: patient identified, IV checked, site marked, risks and benefits discussed, surgical consent, monitors and equipment checked, pre-op evaluation, timeout performed and anesthesia consent Lidocaine 1% used for infiltration Right, radial was placed Catheter size: 20 G Hand hygiene performed  and maximum sterile barriers used   Attempts: 1 Procedure performed without using ultrasound guided technique. Following insertion, dressing applied and Biopatch. Post procedure assessment: normal and unchanged  Patient tolerated the procedure well with no immediate complications.

## 2022-01-14 NOTE — Interval H&P Note (Signed)
History and Physical Interval Note:  01/14/2022 6:47 AM  Mandy Delacruz  has presented today for surgery, with the diagnosis of Severe Aortic Stenosis.  The various methods of treatment have been discussed with the patient and family. After consideration of risks, benefits and other options for treatment, the patient has consented to  Procedure(s): Transcatheter Aortic Valve Replacement, Transfemoral (N/A) INTRAOPERATIVE TRANSTHORACIC ECHOCARDIOGRAM (N/A) as a surgical intervention.  The patient's history has been reviewed, patient examined, no change in status, stable for surgery.  I have reviewed the patient's chart and labs.  Questions were answered to the patient's satisfaction.     Coralie Common

## 2022-01-14 NOTE — Interval H&P Note (Signed)
History and Physical Interval Note:  01/14/2022 8:12 AM  Mandy Delacruz  has presented today for surgery, with the diagnosis of Severe Aortic Stenosis.  The various methods of treatment have been discussed with the patient and family. After consideration of risks, benefits and other options for treatment, the patient has consented to  Procedure(s): Transcatheter Aortic Valve Replacement, Transfemoral (N/A) INTRAOPERATIVE TRANSTHORACIC ECHOCARDIOGRAM (N/A) as a surgical intervention.  The patient's history has been reviewed, patient examined, no change in status, stable for surgery.  I have reviewed the patient's chart and labs.  Questions were answered to the patient's satisfaction.     [name]

## 2022-01-14 NOTE — Progress Notes (Addendum)
  [place] VALVE TEAM  Patient doing well s/p TAVR. She is hemodynamically stable. Groin sites stable. ECG with no sinus with 1st deg AV block and paced complexes. She has not paced since and in sinus with hrs in 60-70s. IJ temp wire pulled out. Urine with gross hematuria. PAT UA had moderate hematuria. Possibly exacerbated by heparin during case. Will monitor. Plan to DC arterial line and transfer to 4E.  Plan for early ambulation after bedrest completed and hopeful discharge over the next 24-48 hours.   Angelena Form PA-C  [facility]  Pager [phone]    ____________________________  Pt developed some transient bradycardia on tele that possibly looks juntional with a narrow QRS. In low 30s. Pt asymptomatic. D/w Dr. Ali Lowe. Still okay to send to 4E progressive floor.   Angelena Form PA-C  [facility]

## 2022-01-14 NOTE — Progress Notes (Signed)
Mobility Specialist Progress Note   01/14/22 1833  Mobility  Activity Ambulated with assistance in hallway  Level of Assistance Minimal assist, patient does 75% or more  Assistive Device Other (Comment) (HHA)  Distance Ambulated (ft) 230 ft  Activity Response Tolerated well  Mobility Referral Yes  $Mobility charge 1 Mobility   Pre Mobility: 68 HR, 150/69 BP, 98% SpO2 During Mobility: 89 HR Post Mobility: 66 HR, 144/78 BP  Received in bed having no current complaints and agreeable. MinA to stand d/t slight unsteadiness but pt able to gather and transfer self to BR w/o an AD, CGA for safety. Successful void. Pt using railing for ambulation in hallway as HHA d/t "wobbliness" in LE's but no reported dizziness or lightheadedness.  Returned back to bed w/ groins site stable, call bell in reach and bed alarm on.   Holland Falling Mobility Specialist Please contact via SecureChat or  Rehab office at [phone]

## 2022-01-14 NOTE — Discharge Instructions (Signed)

## 2022-01-15 ENCOUNTER — Inpatient Hospital Stay (HOSPITAL_COMMUNITY): Payer: BC Managed Care – PPO

## 2022-01-15 ENCOUNTER — Inpatient Hospital Stay (HOSPITAL_BASED_OUTPATIENT_CLINIC_OR_DEPARTMENT_OTHER)
Admit: 2022-01-15 | Discharge: 2022-01-15 | Disposition: A | Discharge status: Home / Self Care | Payer: BC Managed Care – PPO | Attending: Physician Assistant | Admitting: Physician Assistant

## 2022-01-15 ENCOUNTER — Encounter (HOSPITAL_COMMUNITY): Payer: Self-pay | Admitting: Internal Medicine

## 2022-01-15 DIAGNOSIS — Z952 Presence of prosthetic heart valve: Secondary | ICD-10-CM | POA: Diagnosis not present

## 2022-01-15 DIAGNOSIS — R001 Bradycardia, unspecified: Secondary | ICD-10-CM | POA: Diagnosis not present

## 2022-01-15 DIAGNOSIS — I440 Atrioventricular block, first degree: Secondary | ICD-10-CM | POA: Diagnosis not present

## 2022-01-15 LAB — BASIC METABOLIC PANEL
Anion gap: 8 (ref 5–15)
BUN: 20 mg/dL (ref 8–23)
CO2: 23 mmol/L (ref 22–32)
Calcium: 9 mg/dL (ref 8.9–10.3)
Chloride: 104 mmol/L (ref 98–111)
Creatinine, Ser: 0.91 mg/dL (ref 0.44–1.00)
GFR, Estimated: >60 mL/min (ref >60)
Glucose, Bld: 110 mg/dL — ABNORMAL HIGH (ref 70–99)
Potassium: 4 mmol/L (ref 3.5–5.1)
Sodium: 135 mmol/L (ref 135–145)

## 2022-01-15 LAB — CBC
HCT: 30.7 % — ABNORMAL LOW (ref 36.0–46.0)
Hemoglobin: 10.1 g/dL — ABNORMAL LOW (ref 12.0–15.0)
MCH: 30.6 pg (ref 26.0–34.0)
MCHC: 32.9 g/dL (ref 30.0–36.0)
MCV: 93 fL (ref 80.0–100.0)
Platelets: 153 10*3/uL (ref 150–400)
RBC: 3.3 MIL/uL — ABNORMAL LOW (ref 3.87–5.11)
RDW: 15.5 % (ref 11.5–15.5)
WBC: 9.1 10*3/uL (ref 4.0–10.5)
nRBC: 0 % (ref 0.0–0.2)

## 2022-01-15 LAB — ECHOCARDIOGRAM COMPLETE
AR max vel: 1.92 cm2
AV Area VTI: 1.98 cm2
AV Area mean vel: 2.2 cm2
AV Mean grad: 10 mmHg
AV Peak grad: 19.4 mmHg
Ao pk vel: 2.2 m/s
Area-P 1/2: 2.29 cm2
Height: 63 in
S' Lateral: 1.7 cm
Weight: 2185.6 oz

## 2022-01-15 LAB — MAGNESIUM: Magnesium: 1.8 mg/dL (ref 1.7–2.4)

## 2022-01-15 LAB — GLUCOSE, CAPILLARY
Glucose-Capillary: 151 mg/dL — ABNORMAL HIGH (ref 70–99)
Glucose-Capillary: 169 mg/dL — ABNORMAL HIGH (ref 70–99)

## 2022-01-15 MED ORDER — ASPIRIN 81 MG PO CHEW: 81.0000 mg | CHEWABLE_TABLET | Freq: Every day | ORAL | Status: AC

## 2022-01-15 NOTE — Progress Notes (Signed)
Echocardiogram 2D Echocardiogram has been performed.  Oneal Deputy Davinia Riccardi RDCS 01/15/2022, 8:49 AM

## 2022-01-15 NOTE — Progress Notes (Signed)
CARDIAC REHAB PHASE I   PRE:  Rate/Rhythm: 99 SR       SaO2: 96 RA  MODE:  Ambulation: 100 ft   POST:  Rate/Rhythm :  100       SaO2: 95 RA  Pt ambulated in hall independently with slow steady pace. Tolerated well with no dizziness, CP or SOB. Returned to chair with call bell and bedside table in reach. Post TAVR education including site care, restrictions, risk factors, smoking cessation, heart healthy diabetic diet, exercise guidelines and CRP2 reviewed. All questions and concerns addressed. Will refer to [facility] for CRP2. Plan for home today.   0930-1000  Vanessa Barbara, RN BSN 01/15/2022 9:48 AM

## 2022-01-15 NOTE — TOC Transition Note (Signed)
Transition of Care (TOC) - CM/SW Discharge Note Marvetta Gibbons RN, BSN Transitions of Care Unit 4E- RN Case Manager See Treatment Team for direct phone #   Patient Details  Name: Mandy Delacruz MRN: [id number] Date of Birth: Mar 04, 1945  Transition of Care [facility]) CM/SW Contact:  Dawayne Patricia, RN Phone Number: 01/15/2022, 11:49 AM   Clinical Narrative:    Pt s/p TAVR- stable for transition home today. Transition of Care Department [facility]) has reviewed patient and no TOC needs have been identified at this time.  Family to transport home.   Final next level of care: Home/Self Care Barriers to Discharge: No Barriers Identified   Patient Goals and CMS Choice   Choice offered to / list presented to : NA  Discharge Placement                 Home        Discharge Plan and Services Additional resources added to the After Visit Summary for                  DME Arranged: N/A DME Agency: NA       HH Arranged: NA HH Agency: NA        Social Determinants of Health (SDOH) Interventions SDOH Screenings   Food Insecurity: No Food Insecurity (01/14/2022)  Housing: Low Risk  (01/14/2022)  Transportation Needs: No Transportation Needs (01/14/2022)  Utilities: Not At Risk (01/14/2022)  Depression (PHQ2-9): Low Risk  (11/06/2018)  Tobacco Use: Medium Risk (01/15/2022)     Readmission Risk Interventions    01/15/2022   11:49 AM  Readmission Risk Prevention Plan  Post Dischage Appt Complete  Medication Screening Complete  Transportation Screening Complete

## 2022-01-15 NOTE — Progress Notes (Signed)
On reassessment this RN noticed hematoma on the right groin,soft,some mild  tenderness per patient.right PT and DP pulses were palpable,V/S is stable,hematoma was marked,Dr. Marcelle Smiling (cardiology on call )made aware.V/S checked every 30 minutes and pt placed in complete bed rest. Will continue to closely monitor the patient.

## 2022-01-15 NOTE — Anesthesia Postprocedure Evaluation (Signed)
Anesthesia Post Note  Patient: Mandy Delacruz  Procedure(s) Performed: Transcatheter Aortic Valve Replacement, Transfemoral INTRAOPERATIVE TRANSTHORACIC ECHOCARDIOGRAM CORONARY ANGIOGRAPHY     Patient location during evaluation: Cath Lab Anesthesia Type: MAC Level of consciousness: awake and alert Pain management: pain level controlled Vital Signs Assessment: post-procedure vital signs reviewed and stable Respiratory status: spontaneous breathing, nonlabored ventilation, respiratory function stable and patient connected to nasal cannula oxygen Cardiovascular status: stable Postop Assessment: no apparent nausea or vomiting Anesthetic complications: no  There were no known notable events for this encounter.  Last Vitals:  Vitals:   01/15/22 0006 01/15/22 0418  BP:  117/61  Pulse:  83  Resp: 20 14  Temp: 36.6 C 36.8 C  SpO2:  96%    Last Pain:  Vitals:   01/15/22 0418  TempSrc: Oral  PainSc:    Pain Goal: Patients Stated Pain Goal: 0 (01/14/22 0704)                 Javon Hupfer

## 2022-01-15 NOTE — Progress Notes (Signed)
Mandy Delacruz to be D/C'd Home per MD order.  Discussed with the patient and all questions fully answered.  VSS, Skin clean, dry and intact without evidence of skin break down, no evidence of skin tears noted. IV catheter discontinued intact. Site without signs and symptoms of complications. Dressing and pressure applied.  An After Visit Summary was printed and given to the patient. Patient received prescription.  D/c education completed with patient/family including follow up instructions, medication list, d/c activities limitations if indicated, with other d/c instructions as indicated by MD - patient able to verbalize understanding, all questions fully answered.   Patient instructed to return to ED, call 911, or call MD for any changes in condition.   Patient escorted via [place], and D/C home via private auto.  [place] 01/15/2022 12:43 PM

## 2022-01-16 ENCOUNTER — Telehealth: Payer: Self-pay | Admitting: Physician Assistant

## 2022-01-16 NOTE — Telephone Encounter (Signed)
  [place] VALVE TEAM   Patient contacted regarding discharge from [facility] on 01/15/22  Patient understands to follow up with a structural heart APP on 1/12 at [place].  Patient understands discharge instructions? yes Patient understands medications and regimen? yes Patient understands to bring all medications to this visit? yes  Angelena Form PA-C  [facility]

## 2022-01-21 NOTE — Progress Notes (Unsigned)
HEART AND [place]                                     Cardiology Office Note:    Date:  01/21/2022   ID:  Mandy Delacruz, DOB [birth year], MRN [id number]  PCP:  [name], MD  [facility] Cardiologist:  [name], MD  / Dr. Ali Lowe & Dr. Lavonna Monarch (TAVR)  [place] Electrophysiologist:  None   Referring MD: [name], MD   [facility] s/p TAVR  History of Present Illness:    Mandy Delacruz is a 77 y.o. female with a hx of hiatal hernia, GERD, DMT2, HTN, history of smoking with COPD, AVMS with recent GI bleed, IDA and critical AS s/p TAVR (01/14/22) who presents to clinic for follow up.   She was hospitalized on 10/24/2021 with anemia and melena (hemoglobin as low as 6.7), which led to exertional dyspnea and exertional chest tightness. After receiving a 2-unit PRBC blood transfusion the dyspnea and chest discomfort resolved.  Labs confirmed iron deficiency and hg improved on supplementation. She underwent upper and lower endoscopy that did not clearly show the source of the bleeding.  She had been taking ibuprofen "every single day" for a couple of years.     Repeat echo 12/12/21 showed EF 65%, severe LVH of the basal-septal segment, and critical AS with mean grad 67 mmHg, peak grad 113 mmHg, AVA 0.58 cm2, DVI 0.23, as well as severe MAC. She was referred for structural heart consultation. [facility] 12/19/21 showed no angiographic evidence of CAD as well as normal right and left heart pressures.    The patient has been evaluated by the multidisciplinary valve team and underwent successful TAVR with a 23 mm Edwards Sapien 3 Ultra Resilia THV via the TF approach on 01/14/22. Post operative echo showed EF >75%, normally functioning TAVR with a mean gradient of 10 mmHg and no PVL as well as moderate to severe MAC.  Started on a baby Asprin alone. Given sinus brady into the 20s, she was discharged with a Zio AT.   She called into the office several  times complaining of headache as well as groin pain.   Today the patient presents to clinic for follow up.     Past Medical History:  Diagnosis Date   Anemia    Asthma    Bronchitis    on bactrin since 12-27-14   Colon polyps 2001   Diverticulosis of colon (without mention of hemorrhage) 2001   Dizziness    inner ear problem at times, saw dr  [name] for   GERD (gastroesophageal reflux disease)    GI bleed    Hiatal hernia 2001   Hyperlipidemia    Hypertension    IH (inguinal hernia)    left   Lung nodule    Other esophagitis 2001   Severe aortic stenosis    Tobacco abuse     Past Surgical History:  Procedure Laterality Date   breast biospy     left, benign   CHOLECYSTECTOMY     COLONOSCOPY     COLONOSCOPY WITH PROPOFOL N/A 10/26/2021   Procedure: COLONOSCOPY WITH PROPOFOL;  Surgeon: Doran Stabler, MD;  Location: [place];  Service: Gastroenterology;  Laterality: N/A;   CORONARY ANGIOGRAPHY N/A 01/14/2022   Procedure: CORONARY ANGIOGRAPHY;  Surgeon: [name], MD;  Location: [facility] INVASIVE CV  LAB;  Service: Open Heart Surgery;  Laterality: N/A;   ENDOVENOUS ABLATION SAPHENOUS VEIN W/ LASER Left 04/29/2016   endovenous laser ablation L SSV and stab phlebectomy left leg by Tinnie Gens MD    ESOPHAGOGASTRODUODENOSCOPY (EGD) WITH PROPOFOL N/A 10/25/2021   Procedure: ESOPHAGOGASTRODUODENOSCOPY (EGD) WITH PROPOFOL;  Surgeon: Sharyn Creamer, MD;  Location: [facility] ENDOSCOPY;  Service: Gastroenterology;  Laterality: N/A;   INGUINAL HERNIA REPAIR  oct 2015   left   INGUINAL HERNIA REPAIR Left 01/10/2015   Procedure: OPEN REPAIR OF RECURRENT LEFT INGUINAL HERNIA ;  Surgeon: Greer Pickerel, MD;  Location: [facility];  Service: General;  Laterality: Left;   INSERTION OF MESH Left 01/10/2015   Procedure: INSERTION OF MESH;  Surgeon: Greer Pickerel, MD;  Location: [facility];  Service: General;  Laterality: Left;   INSERTION OF MESH N/A 08/18/2017   Procedure: INSERTION OF MESH;   Surgeon: Greer Pickerel, MD;  Location: [place];  Service: General;  Laterality: N/A;   INTRAOPERATIVE TRANSTHORACIC ECHOCARDIOGRAM N/A 01/14/2022   Procedure: INTRAOPERATIVE TRANSTHORACIC ECHOCARDIOGRAM;  Surgeon: [name], MD;  Location: [place] CV LAB;  Service: Open Heart Surgery;  Laterality: N/A;   LAPAROSCOPY N/A 01/10/2015   Procedure: LAPAROSCOPY DIAGNOSTIC;  Surgeon: Greer Pickerel, MD;  Location: [facility];  Service: General;  Laterality: N/A;   ORIF HIP FRACTURE  2006   3 pins, right   RIGHT HEART CATH AND CORONARY ANGIOGRAPHY N/A 12/19/2021   Procedure: RIGHT HEART CATH AND CORONARY ANGIOGRAPHY;  Surgeon: Burnell Blanks, MD;  Location: [place] CV LAB;  Service: Cardiovascular;  Laterality: N/A;   TRANSCATHETER AORTIC VALVE REPLACEMENT, TRANSFEMORAL N/A 01/14/2022   Procedure: Transcatheter Aortic Valve Replacement, Transfemoral;  Surgeon: [name], MD;  Location: [place] CV LAB;  Service: Open Heart Surgery;  Laterality: N/A;   UMBILICAL HERNIA REPAIR N/A 08/18/2017   Procedure: LAPAROSCOPIC-ASSISTED UMBILICAL HERNIA  REPAIR ERAS PATHWAY;  Surgeon: Greer Pickerel, MD;  Location: [place];  Service: General;  Laterality: N/A;    Current Medications: No outpatient medications have been marked as taking for the 01/22/22 encounter (Appointment) with [facility].     Allergies:   Doxycycline hyclate, Ace inhibitors, Hydrocodone bit-homatrop mbr, and Codeine   Social History   Socioeconomic History   Marital status: Married    Spouse name: Not on file   Number of children: 2   Years of education: Not on file   Highest education level: Not on file  Occupational History   Occupation: part-time office asst.  Tobacco Use   Smoking status: Former    Years: 45.00    Types: Cigarettes   Smokeless tobacco: Never   Tobacco comments:    maybe 5-10 cigarettes a day  starts and stops smoking every few days  Vaping Use   Vaping Use: Never used   Substance and Sexual Activity   Alcohol use: Yes    Alcohol/week: 0.0 - 1.0 standard drinks of alcohol    Comment: social   Drug use: No   Sexual activity: Not on file  Other Topics Concern   Not on file  Social History Narrative   Not on file   Social Determinants of Health   Financial Resource Strain: Not on file  Food Insecurity: No Food Insecurity (01/14/2022)   Hunger Vital Sign    Worried About Running Out of Food in the Last Year: Never true    Ran Out of Food in the Last Year: Never true  Transportation  Needs: No Transportation Needs (01/14/2022)   PRAPARE - Hydrologist (Medical): No    Lack of Transportation (Non-Medical): No  Physical Activity: Not on file  Stress: Not on file  Social Connections: Not on file     Family History: The patient's family history includes COPD in her mother; Celiac disease in an other family member; Hypertension in her father. There is no history of Colon cancer, Esophageal cancer, Rectal cancer, or Stomach cancer.  ROS:   Please see the history of present illness.    All other systems reviewed and are negative.  EKGs/Labs/Other Studies Reviewed:    The following studies were reviewed today:   TAVR OPERATIVE NOTE     Date of Procedure:                01/14/2022   Preoperative Diagnosis:      Severe Aortic Stenosis    Postoperative Diagnosis:    Same    Procedure:        Transcatheter Aortic Valve Replacement - Percutaneous right Transfemoral Approach             Edwards Sapien 3 Ultra THV (size 23 mm, model # 9750RSL, serial # [id number])              Co-Surgeons:                        Coralie Common MD and Lenna Sciara, MD     Anesthesiologist:                  Laurie Panda MD   Echocardiographer:              [name] MD   Pre-operative Echo Findings: Severe aortic stenosis normal left ventricular systolic function   Post-operative Echo Findings: no paravalvular leak normal left  ventricular systolic function   _____________     Echo 01/15/22: IMPRESSIONS   1. Left ventricular ejection fraction, by estimation, is >75%. The left  ventricle has hyperdynamic function. The left ventricle has no regional  wall motion abnormalities. Left ventricular diastolic parameters were  normal.   2. Right ventricular systolic function is hyperdynamic. The right  ventricular size is normal. Tricuspid regurgitation signal is inadequate  for assessing PA pressure.   3. Left atrial size was mildly dilated.   4. The mitral valve is degenerative. No evidence of mitral valve  regurgitation. No evidence of mitral stenosis. Moderate to severe mitral  annular calcification.   5. The aortic valve has been repaired/replaced. Aortic valve  regurgitation is not visualized. Echo findings are consistent with normal  structure and function of the aortic valve prosthesis. Aortic valve mean  gradient measures 10.0 mmHg. Aortic valve  Vmax measures 2.20 m/s. Aortic valve acceleration time measures 81 msec.   EKG:  EKG is ordered today.  The ekg ordered today demonstrates ***  Recent Labs: 01/10/2022: ALT 28 01/15/2022: BUN 20; Creatinine, Ser 0.91; Hemoglobin 10.1; Magnesium 1.8; Platelets 153; Potassium 4.0; Sodium 135  Recent Lipid Panel No results found for: "CHOL", "TRIG", "HDL", "CHOLHDL", "VLDL", "LDLCALC", "LDLDIRECT"   Risk Assessment/Calculations:       Physical Exam:    VS:  There were no vitals taken for this visit.    Wt Readings from Last 3 Encounters:  01/15/22 136 lb 9.6 oz (62 kg)  12/30/21 137 lb (62.1 kg)  12/19/21 137 lb (62.1 kg)     GEN: *** Well  nourished, well developed in no acute distress HEENT: Normal NECK: No JVD LYMPHATICS: No lymphadenopathy CARDIAC: ***RRR, no murmurs, rubs, gallops RESPIRATORY:  Clear to auscultation without rales, wheezing or rhonchi  ABDOMEN: Soft, non-tender, non-distended MUSCULOSKELETAL:  No edema; No deformity  SKIN: Warm  and dry NEUROLOGIC:  Alert and oriented x 3 PSYCHIATRIC:  Normal affect   ASSESSMENT:    1. S/P TAVR (transcatheter aortic valve replacement)   2. Sinus bradycardia   3. Iron deficiency anemia due to chronic blood loss   4. Essential hypertension   5. Chronic obstructive pulmonary disease, unspecified COPD type ([place])   6. Hematuria, unspecified type    PLAN:    In order of problems listed above:  Severe AS s/p TAVR:   Continue on a baby Asprin alone.   Sinus brady: wearing Zio AT with no high risk alerts   IDA with history of GI bleeding: followed closely by Dr. Loletha Carrow. Hopefully will tolerate a baby aspirin without further issues. Check CBC    HTN: BP well controlled. Resume home meds   COPD: stable   Hematuria: pre op UA showed moderate hematuria. After TAVR she had gross hematuria and canister of urine bright red. Now resolved. Will refer to urology given history of smoking.   {The patient has an active order for outpatient cardiac rehabilitation.   Please indicate if the patient is ready to start. Do NOT delete this.  It will auto delete.  Refresh note, then sign.              Click here to document readiness and see contraindications.  :1}  Cardiac Rehabilitation Eligibility Assessment      {Are you ordering a CV Procedure (e.g. stress test, cath, DCCV, TEE, etc)?   Press F2        :[id number]}    Medication Adjustments/Labs and Tests Ordered: Current medicines are reviewed at length with the patient today.  Concerns regarding medicines are outlined above.  No orders of the defined types were placed in this encounter.  No orders of the defined types were placed in this encounter.   There are no Patient Instructions on file for this visit.   Signed, Angelena Form, PA-C  01/21/2022 2:11 PM    [facility]

## 2022-01-22 ENCOUNTER — Ambulatory Visit: Payer: BC Managed Care – PPO | Attending: Physician Assistant | Admitting: Physician Assistant

## 2022-01-22 VITALS — BP 142/70 | HR 79 | Ht 63.0 in | Wt 138.8 lb

## 2022-01-22 DIAGNOSIS — R319 Hematuria, unspecified: Secondary | ICD-10-CM

## 2022-01-22 DIAGNOSIS — R001 Bradycardia, unspecified: Secondary | ICD-10-CM

## 2022-01-22 DIAGNOSIS — Z952 Presence of prosthetic heart valve: Secondary | ICD-10-CM

## 2022-01-22 DIAGNOSIS — J449 Chronic obstructive pulmonary disease, unspecified: Secondary | ICD-10-CM

## 2022-01-22 DIAGNOSIS — D500 Iron deficiency anemia secondary to blood loss (chronic): Secondary | ICD-10-CM

## 2022-01-22 DIAGNOSIS — I10 Essential (primary) hypertension: Secondary | ICD-10-CM | POA: Diagnosis not present

## 2022-01-22 MED ORDER — AMOXICILLIN 500 MG PO TABS: 2000.0000 mg | ORAL_TABLET | ORAL | 12 refills | Status: DC

## 2022-01-22 MED ORDER — LOSARTAN POTASSIUM-HCTZ 50-12.5 MG PO TABS: 1.0000 | ORAL_TABLET | Freq: Every day | ORAL | 3 refills | Status: DC

## 2022-01-22 NOTE — Patient Instructions (Signed)
Medication Instructions:  1.Increase losartan-hydrochlorothiazide (Hyzaar) 50-12.5 mg to one full tablet daily 2.Start amoxicillin 500 mg, take 4 tablets (2000 mg) 1 hour prior to all dental appointments, including cleanings. *If you need a refill on your cardiac medications before your next appointment, please call your pharmacy*   Lab Work: None ordered If you have labs (blood work) drawn today and your tests are completely normal, you will receive your results only by: [place] (if you have MyChart) OR A paper copy in the mail If you have any lab test that is abnormal or we need to change your treatment, we will call you to review the results.  Follow-Up: At [facility], you and your health needs are our priority.  As part of our continuing mission to provide you with exceptional heart care, we have created designated Provider Care Teams.  These Care Teams include your primary Cardiologist (physician) and Advanced Practice Providers (APPs -  Physician Assistants and Nurse Practitioners) who all work together to provide you with the care you need, when you need it.  Your next appointment:   02/24/2022 at 9:45 AM, Echo prior at 8:35 AM  The format for your next appointment:   In Person  Provider:   Kathyrn Drown, NP  Important Information About Sugar

## 2022-01-24 ENCOUNTER — Ambulatory Visit: Payer: BC Managed Care – PPO

## 2022-01-29 ENCOUNTER — Encounter: Payer: BC Managed Care – PPO | Attending: Cardiovascular Disease | Admitting: *Deleted

## 2022-01-29 DIAGNOSIS — Z952 Presence of prosthetic heart valve: Secondary | ICD-10-CM

## 2022-01-29 NOTE — Progress Notes (Signed)
Virtual orientation call completed today. shehas an appointment on Date: 01/30/2022  for EP eval and gym Orientation.  Documentation of diagnosis can be found in [facility] Date: 01/14/2022 .

## 2022-01-30 VITALS — Ht 64.25 in | Wt 136.5 lb

## 2022-01-30 DIAGNOSIS — Z952 Presence of prosthetic heart valve: Secondary | ICD-10-CM | POA: Diagnosis not present

## 2022-01-30 DIAGNOSIS — R319 Hematuria, unspecified: Secondary | ICD-10-CM | POA: Diagnosis not present

## 2022-01-30 DIAGNOSIS — E1169 Type 2 diabetes mellitus with other specified complication: Secondary | ICD-10-CM | POA: Diagnosis not present

## 2022-01-30 DIAGNOSIS — E7800 Pure hypercholesterolemia, unspecified: Secondary | ICD-10-CM | POA: Diagnosis not present

## 2022-01-30 DIAGNOSIS — I129 Hypertensive chronic kidney disease with stage 1 through stage 4 chronic kidney disease, or unspecified chronic kidney disease: Secondary | ICD-10-CM | POA: Diagnosis not present

## 2022-01-30 DIAGNOSIS — N1831 Chronic kidney disease, stage 3a: Secondary | ICD-10-CM | POA: Diagnosis not present

## 2022-01-30 NOTE — Patient Instructions (Signed)
Patient Instructions  Patient Details  Name: Mandy Delacruz MRN: [id number] Date of Birth: [birth year] Referring Provider:  [name], MD  Below are your personal goals for exercise, nutrition, and risk factors. Our goal is to help you stay on track towards obtaining and maintaining these goals. We will be discussing your progress on these goals with you throughout the program.  Initial Exercise Prescription:  Initial Exercise Prescription - 01/30/22 1300       Date of Initial Exercise RX and Referring Provider   Date 01/30/22    Referring Provider Croitoru, Mihai MD      Oxygen   Maintain Oxygen Saturation 88% or higher      Treadmill   MPH 2.1    Grade 1    Minutes 15    METs 2.9      Recumbant Bike   Level 2    RPM 50    Watts 19    Minutes 15    METs 2.97      NuStep   Level 2    SPM 80    Minutes 15    METs 2.97      Prescription Details   Frequency (times per week) 3    Duration Progress to 30 minutes of continuous aerobic without signs/symptoms of physical distress      Intensity   THRR 40-80% of Max Heartrate 95-128    Ratings of Perceived Exertion 11-13    Perceived Dyspnea 0-4      Progression   Progression Continue to progress workloads to maintain intensity without signs/symptoms of physical distress.      Resistance Training   Training Prescription Yes    Weight 3 lb    Reps 10-15             Exercise Goals: Frequency: Be able to perform aerobic exercise two to three times per week in program working toward 2-5 days per week of home exercise.  Intensity: Work with a perceived exertion of 11 (fairly light) - 15 (hard) while following your exercise prescription.  We will make changes to your prescription with you as you progress through the program.   Duration: Be able to do 30 to 45 minutes of continuous aerobic exercise in addition to a 5 minute warm-up and a 5 minute cool-down routine.   Nutrition Goals: Your personal nutrition  goals will be established when you do your nutrition analysis with the dietician.  The following are general nutrition guidelines to follow: Cholesterol < '200mg'$ /day Sodium < '1500mg'$ /day Fiber: Women over 50 yrs - 21 grams per day  Personal Goals:  Personal Goals and Risk Factors at Admission - 01/30/22 1058       Core Components/Risk Factors/Patient Goals on Admission    Weight Management Yes    Admit Weight 136 lb 8 oz (61.9 kg)    Goal Weight: Long Term 135 lb (61.2 kg)    Expected Outcomes Short Term: Continue to assess and modify interventions until short term weight is achieved;Long Term: Adherence to nutrition and physical activity/exercise program aimed toward attainment of established weight goal;Weight Maintenance: Understanding of the daily nutrition guidelines, which includes 25-35% calories from fat, 7% or less cal from saturated fats, less than '200mg'$  cholesterol, less than 1.5gm of sodium, & 5 or more servings of fruits and vegetables daily    Diabetes Yes    Intervention Provide education about signs/symptoms and action to take for hypo/hyperglycemia.;Provide education about proper nutrition, including hydration, and aerobic/resistive exercise prescription  along with prescribed medications to achieve blood glucose in normal ranges: Fasting glucose 65-99 mg/dL    Expected Outcomes Short Term: Participant verbalizes understanding of the signs/symptoms and immediate care of hyper/hypoglycemia, proper foot care and importance of medication, aerobic/resistive exercise and nutrition plan for blood glucose control.;Long Term: Attainment of HbA1C < 7%.    Hypertension Yes    Intervention Provide education on lifestyle modifcations including regular physical activity/exercise, weight management, moderate sodium restriction and increased consumption of fresh fruit, vegetables, and low fat dairy, alcohol moderation, and smoking cessation.;Monitor prescription use compliance.    Expected  Outcomes Short Term: Continued assessment and intervention until BP is < 140/38m HG in hypertensive participants. < 130/849mHG in hypertensive participants with diabetes, heart failure or chronic kidney disease.;Long Term: Maintenance of blood pressure at goal levels.    Lipids Yes    Intervention Provide education and support for participant on nutrition & aerobic/resistive exercise along with prescribed medications to achieve LDL '70mg'$ , HDL >'40mg'$ .    Expected Outcomes Short Term: Participant states understanding of desired cholesterol values and is compliant with medications prescribed. Participant is following exercise prescription and nutrition guidelines.;Long Term: Cholesterol controlled with medications as prescribed, with individualized exercise RX and with personalized nutrition plan. Value goals: LDL < '70mg'$ , HDL > 40 mg.             Tobacco Use Initial Evaluation: Social History   Tobacco Use  Smoking Status Former   Years: 45.00   Types: Cigarettes  Smokeless Tobacco Never  Tobacco Comments   maybe 5-10 cigarettes a day  starts and stops smoking every few days    Exercise Goals and Review:  Exercise Goals     Row Name 01/30/22 1307             Exercise Goals   Increase Physical Activity Yes       Intervention Provide advice, education, support and counseling about physical activity/exercise needs.;Develop an individualized exercise prescription for aerobic and resistive training based on initial evaluation findings, risk stratification, comorbidities and participant's personal goals.       Expected Outcomes Short Term: Attend rehab on a regular basis to increase amount of physical activity.;Long Term: Add in home exercise to make exercise part of routine and to increase amount of physical activity.;Long Term: Exercising regularly at least 3-5 days a week.       Increase Strength and Stamina Yes       Intervention Provide advice, education, support and counseling  about physical activity/exercise needs.;Develop an individualized exercise prescription for aerobic and resistive training based on initial evaluation findings, risk stratification, comorbidities and participant's personal goals.       Expected Outcomes Short Term: Increase workloads from initial exercise prescription for resistance, speed, and METs.;Short Term: Perform resistance training exercises routinely during rehab and add in resistance training at home;Long Term: Improve cardiorespiratory fitness, muscular endurance and strength as measured by increased METs and functional capacity (6MWT)       Able to understand and use rate of perceived exertion (RPE) scale Yes       Intervention Provide education and explanation on how to use RPE scale       Expected Outcomes Short Term: Able to use RPE daily in rehab to express subjective intensity level;Long Term:  Able to use RPE to guide intensity level when exercising independently       Able to understand and use Dyspnea scale Yes       Intervention Provide education  and explanation on how to use Dyspnea scale       Expected Outcomes Short Term: Able to use Dyspnea scale daily in rehab to express subjective sense of shortness of breath during exertion;Long Term: Able to use Dyspnea scale to guide intensity level when exercising independently       Knowledge and understanding of Target Heart Rate Range (THRR) Yes       Intervention Provide education and explanation of THRR including how the numbers were predicted and where they are located for reference       Expected Outcomes Short Term: Able to state/look up THRR;Long Term: Able to use THRR to govern intensity when exercising independently;Short Term: Able to use daily as guideline for intensity in rehab       Able to check pulse independently Yes       Intervention Provide education and demonstration on how to check pulse in carotid and radial arteries.;Review the importance of being able to check your  own pulse for safety during independent exercise       Expected Outcomes Short Term: Able to explain why pulse checking is important during independent exercise;Long Term: Able to check pulse independently and accurately       Understanding of Exercise Prescription Yes       Intervention Provide education, explanation, and written materials on patient's individual exercise prescription       Expected Outcomes Short Term: Able to explain program exercise prescription;Long Term: Able to explain home exercise prescription to exercise independently

## 2022-01-30 NOTE — Progress Notes (Signed)
Cardiac Individual Treatment Plan  Patient Details  Name: Mandy Delacruz MRN: [id number] Date of Birth: 1945-12-21 Referring Provider:   Flowsheet Row Cardiac Rehab from 01/30/2022 in [facility] Cardiac and Pulmonary Rehab  Referring Provider [name] MD       Initial Encounter Date:  Flowsheet Row Cardiac Rehab from 01/30/2022 in [facility] Cardiac and Pulmonary Rehab  Date 01/30/22       Visit Diagnosis: S/P TAVR (transcatheter aortic valve replacement)  Patient's Home Medications on Admission:  Current Outpatient Medications:    ACCU-CHEK GUIDE test strip, 2 (two) times daily. as directed, Disp: , Rfl:    acetaminophen (TYLENOL) 650 MG CR tablet, Take 1,300 mg by mouth every 8 (eight) hours as needed for pain., Disp: , Rfl:    albuterol (VENTOLIN HFA) 108 (90 Base) MCG/ACT inhaler, Inhale 2 puffs into the lungs every 6 (six) hours as needed for wheezing or shortness of breath., Disp: , Rfl:    amoxicillin (AMOXIL) 500 MG tablet, Take 4 tablets (2,000 mg total) by mouth as directed. 1 hour prior to dental work including cleanings, Disp: 12 tablet, Rfl: 12   aspirin 81 MG chewable tablet, Chew 1 tablet (81 mg total) by mouth daily. Okay to swallow whole., Disp: , Rfl:    diphenhydrAMINE (BENADRYL) 25 MG tablet, Take 25 mg by mouth at bedtime., Disp: , Rfl:    ferrous sulfate 325 (65 FE) MG tablet, Take 1 tablet (325 mg total) by mouth daily., Disp: 30 tablet, Rfl: 3   fexofenadine (ALLEGRA) 180 MG tablet, Take 180 mg by mouth daily as needed for allergies or rhinitis., Disp: , Rfl:    losartan-hydrochlorothiazide (HYZAAR) 50-12.5 MG tablet, Take 1 tablet by mouth daily., Disp: 90 tablet, Rfl: 3   pantoprazole (PROTONIX) 40 MG tablet, Take 1 tablet (40 mg total) by mouth daily. (Patient taking differently: Take 40 mg by mouth at bedtime.), Disp: 30 tablet, Rfl: 2   pravastatin (PRAVACHOL) 40 MG tablet, TAKE 1 TABLET(40 MG) BY MOUTH EVERY EVENING, Disp: 90 tablet, Rfl: 0   SYMBICORT 80-4.5  MCG/ACT inhaler, Take 2 puffs by mouth 2 (two) times daily. (Patient not taking: Reported on 01/29/2022), Disp: , Rfl: 11  Past Medical History: Past Medical History:  Diagnosis Date   Anemia    Asthma    Bronchitis    on bactrin since 12-27-14   Colon polyps 2001   Diverticulosis of colon (without mention of hemorrhage) 2001   Dizziness    inner ear problem at times, saw dr  [name] for   GERD (gastroesophageal reflux disease)    GI bleed    Hiatal hernia 2001   Hyperlipidemia    Hypertension    IH (inguinal hernia)    left   Lung nodule    Other esophagitis 2001   Severe aortic stenosis    Tobacco abuse     Tobacco Use: Social History   Tobacco Use  Smoking Status Former   Years: 45.00   Types: Cigarettes  Smokeless Tobacco Never  Tobacco Comments   maybe 5-10 cigarettes a day  starts and stops smoking every few days    Labs: Review Flowsheet       Latest Ref Rng & Units 10/25/2021 12/19/2021 01/14/2022  Labs for ITP Cardiac and Pulmonary Rehab  Hemoglobin A1c 4.8 - 5.6 % 6.0  - -  PH, Arterial 7.35 - 7.45 - 7.391  -  PCO2 arterial 32 - 48 mmHg - 34.7  -  Bicarbonate 20.0 -  28.0 mmol/L - 25.0  21.1  -  TCO2 22 - 32 mmol/L - '26  22  22  24   '$ Acid-base deficit 0.0 - 2.0 mmol/L - 1.0  3.0  -  O2 Saturation % - 67  94  -     Exercise Target Goals: Exercise Program Goal: Individual exercise prescription set using results from initial 6 min walk test and THRR while considering  patient's activity barriers and safety.   Exercise Prescription Goal: Initial exercise prescription builds to 30-45 minutes a day of aerobic activity, 2-3 days per week.  Home exercise guidelines will be given to patient during program as part of exercise prescription that the participant will acknowledge.   Education: Aerobic Exercise: - Group verbal and visual presentation on the components of exercise prescription. Introduces F.I.T.T principle from ACSM for exercise  prescriptions.  Reviews F.I.T.T. principles of aerobic exercise including progression. Written material given at graduation.   Education: Resistance Exercise: - Group verbal and visual presentation on the components of exercise prescription. Introduces F.I.T.T principle from ACSM for exercise prescriptions  Reviews F.I.T.T. principles of resistance exercise including progression. Written material given at graduation.    Education: Exercise & Equipment Safety: - Individual verbal instruction and demonstration of equipment use and safety with use of the equipment. Flowsheet Row Cardiac Rehab from 01/30/2022 in [facility] Cardiac and Pulmonary Rehab  Date 01/30/22  Educator NT  Instruction Review Code 1- Verbalizes Understanding       Education: Exercise Physiology & General Exercise Guidelines: - Group verbal and written instruction with models to review the exercise physiology of the cardiovascular system and associated critical values. Provides general exercise guidelines with specific guidelines to those with heart or lung disease.    Education: Flexibility, Balance, Mind/Body Relaxation: - Group verbal and visual presentation with interactive activity on the components of exercise prescription. Introduces F.I.T.T principle from ACSM for exercise prescriptions. Reviews F.I.T.T. principles of flexibility and balance exercise training including progression. Also discusses the mind body connection.  Reviews various relaxation techniques to help reduce and manage stress (i.e. Deep breathing, progressive muscle relaxation, and visualization). Balance handout provided to take home. Written material given at graduation.   Activity Barriers & Risk Stratification:  Activity Barriers & Cardiac Risk Stratification - 01/30/22 1333       Activity Barriers & Cardiac Risk Stratification   Activity Barriers Arthritis;Back Problems;Joint Problems;Muscular Weakness   left foot numbness   Cardiac Risk  Stratification Low             6 Minute Walk:  6 Minute Walk     Row Name 01/30/22 1332         6 Minute Walk   Phase Initial     Distance 1210 feet     Walk Time 6 minutes     # of Rest Breaks 0     MPH 2.29     METS 2.97     RPE 12     Perceived Dyspnea  0     VO2 Peak 10.39     Symptoms Yes (comment)     Comments Calf tightness     Resting HR 63 bpm     Resting BP 140/66     Resting Oxygen Saturation  99 %     Exercise Oxygen Saturation  during 6 min walk 98 %     Max Ex. HR 104 bpm     Max Ex. BP 168/74     2 Minute Post BP  150/70              Oxygen Initial Assessment:   Oxygen Re-Evaluation:   Oxygen Discharge (Final Oxygen Re-Evaluation):   Initial Exercise Prescription:  Initial Exercise Prescription - 01/30/22 1300       Date of Initial Exercise RX and Referring Provider   Date 01/30/22    Referring Provider Croitoru, Mihai MD      Oxygen   Maintain Oxygen Saturation 88% or higher      Treadmill   MPH 2.1    Grade 1    Minutes 15    METs 2.9      Recumbant Bike   Level 2    RPM 50    Watts 19    Minutes 15    METs 2.97      NuStep   Level 2    SPM 80    Minutes 15    METs 2.97      Prescription Details   Frequency (times per week) 3    Duration Progress to 30 minutes of continuous aerobic without signs/symptoms of physical distress      Intensity   THRR 40-80% of Max Heartrate 95-128    Ratings of Perceived Exertion 11-13    Perceived Dyspnea 0-4      Progression   Progression Continue to progress workloads to maintain intensity without signs/symptoms of physical distress.      Resistance Training   Training Prescription Yes    Weight 3 lb    Reps 10-15             Perform Capillary Blood Glucose checks as needed.  Exercise Prescription Changes:   Exercise Prescription Changes     Row Name 01/30/22 1300             Response to Exercise   Blood Pressure (Admit) 140/66       Blood Pressure  (Exercise) 168/74       Blood Pressure (Exit) 150/70       Heart Rate (Admit) 63 bpm       Heart Rate (Exercise) 104 bpm       Heart Rate (Exit) 76 bpm       Oxygen Saturation (Admit) 99 %       Oxygen Saturation (Exercise) 98 %       Oxygen Saturation (Exit) 98 %       Rating of Perceived Exertion (Exercise) 12       Perceived Dyspnea (Exercise) 0       Symptoms Calf tightness       Comments 6MWT Results                Exercise Comments:   Exercise Goals and Review:   Exercise Goals     Row Name 01/30/22 1307             Exercise Goals   Increase Physical Activity Yes       Intervention Provide advice, education, support and counseling about physical activity/exercise needs.;Develop an individualized exercise prescription for aerobic and resistive training based on initial evaluation findings, risk stratification, comorbidities and participant's personal goals.       Expected Outcomes Short Term: Attend rehab on a regular basis to increase amount of physical activity.;Long Term: Add in home exercise to make exercise part of routine and to increase amount of physical activity.;Long Term: Exercising regularly at least 3-5 days a week.       Increase Strength and Stamina Yes  Intervention Provide advice, education, support and counseling about physical activity/exercise needs.;Develop an individualized exercise prescription for aerobic and resistive training based on initial evaluation findings, risk stratification, comorbidities and participant's personal goals.       Expected Outcomes Short Term: Increase workloads from initial exercise prescription for resistance, speed, and METs.;Short Term: Perform resistance training exercises routinely during rehab and add in resistance training at home;Long Term: Improve cardiorespiratory fitness, muscular endurance and strength as measured by increased METs and functional capacity (6MWT)       Able to understand and use rate of  perceived exertion (RPE) scale Yes       Intervention Provide education and explanation on how to use RPE scale       Expected Outcomes Short Term: Able to use RPE daily in rehab to express subjective intensity level;Long Term:  Able to use RPE to guide intensity level when exercising independently       Able to understand and use Dyspnea scale Yes       Intervention Provide education and explanation on how to use Dyspnea scale       Expected Outcomes Short Term: Able to use Dyspnea scale daily in rehab to express subjective sense of shortness of breath during exertion;Long Term: Able to use Dyspnea scale to guide intensity level when exercising independently       Knowledge and understanding of Target Heart Rate Range (THRR) Yes       Intervention Provide education and explanation of THRR including how the numbers were predicted and where they are located for reference       Expected Outcomes Short Term: Able to state/look up THRR;Long Term: Able to use THRR to govern intensity when exercising independently;Short Term: Able to use daily as guideline for intensity in rehab       Able to check pulse independently Yes       Intervention Provide education and demonstration on how to check pulse in carotid and radial arteries.;Review the importance of being able to check your own pulse for safety during independent exercise       Expected Outcomes Short Term: Able to explain why pulse checking is important during independent exercise;Long Term: Able to check pulse independently and accurately       Understanding of Exercise Prescription Yes       Intervention Provide education, explanation, and written materials on patient's individual exercise prescription       Expected Outcomes Short Term: Able to explain program exercise prescription;Long Term: Able to explain home exercise prescription to exercise independently                Exercise Goals Re-Evaluation :   Discharge Exercise Prescription  (Final Exercise Prescription Changes):  Exercise Prescription Changes - 01/30/22 1300       Response to Exercise   Blood Pressure (Admit) 140/66    Blood Pressure (Exercise) 168/74    Blood Pressure (Exit) 150/70    Heart Rate (Admit) 63 bpm    Heart Rate (Exercise) 104 bpm    Heart Rate (Exit) 76 bpm    Oxygen Saturation (Admit) 99 %    Oxygen Saturation (Exercise) 98 %    Oxygen Saturation (Exit) 98 %    Rating of Perceived Exertion (Exercise) 12    Perceived Dyspnea (Exercise) 0    Symptoms Calf tightness    Comments 6MWT Results             Nutrition:  Target Goals: Understanding of nutrition guidelines,  daily intake of sodium '1500mg'$ , cholesterol '200mg'$ , calories 30% from fat and 7% or less from saturated fats, daily to have 5 or more servings of fruits and vegetables.  Education: All About Nutrition: -Group instruction provided by verbal, written material, interactive activities, discussions, models, and posters to present general guidelines for heart healthy nutrition including fat, fiber, MyPlate, the role of sodium in heart healthy nutrition, utilization of the nutrition label, and utilization of this knowledge for meal planning. Follow up email sent as well. Written material given at graduation. Flowsheet Row Cardiac Rehab from 01/30/2022 in [facility] Cardiac and Pulmonary Rehab  Education need identified 01/30/22       Biometrics:  Pre Biometrics - 01/30/22 1334       Pre Biometrics   Height 5' 4.25" (1.632 m)    Weight 136 lb 8 oz (61.9 kg)    Waist Circumference 37 inches    Hip Circumference 37.5 inches    Waist to Hip Ratio 0.99 %    BMI (Calculated) 23.25    Single Leg Stand 11.8 seconds   L             Nutrition Therapy Plan and Nutrition Goals:  Nutrition Therapy & Goals - 01/30/22 1058       Intervention Plan   Intervention Prescribe, educate and counsel regarding individualized specific dietary modifications aiming towards targeted core  components such as weight, hypertension, lipid management, diabetes, heart failure and other comorbidities.    Expected Outcomes Short Term Goal: Understand basic principles of dietary content, such as calories, fat, sodium, cholesterol and nutrients.;Short Term Goal: A plan has been developed with personal nutrition goals set during dietitian appointment.;Long Term Goal: Adherence to prescribed nutrition plan.             Nutrition Assessments:  MEDIFICTS Score Key: ?70 Need to make dietary changes  40-70 Heart Healthy Diet ? 40 Therapeutic Level Cholesterol Diet  Flowsheet Row Cardiac Rehab from 01/30/2022 in [facility] Cardiac and Pulmonary Rehab  Picture Your Plate Total Score on Admission 55      Picture Your Plate Scores: <34 Unhealthy dietary pattern with much room for improvement. 41-50 Dietary pattern unlikely to meet recommendations for good health and room for improvement. 51-60 More healthful dietary pattern, with some room for improvement.  >60 Healthy dietary pattern, although there may be some specific behaviors that could be improved.    Nutrition Goals Re-Evaluation:   Nutrition Goals Discharge (Final Nutrition Goals Re-Evaluation):   Psychosocial: Target Goals: Acknowledge presence or absence of significant depression and/or stress, maximize coping skills, provide positive support system. Participant is able to verbalize types and ability to use techniques and skills needed for reducing stress and depression.   Education: Stress, Anxiety, and Depression - Group verbal and visual presentation to define topics covered.  Reviews how body is impacted by stress, anxiety, and depression.  Also discusses healthy ways to reduce stress and to treat/manage anxiety and depression.  Written material given at graduation.   Education: Sleep Hygiene -Provides group verbal and written instruction about how sleep can affect your health.  Define sleep hygiene, discuss sleep cycles  and impact of sleep habits. Review good sleep hygiene tips.    Initial Review & Psychosocial Screening:  Initial Psych Review & Screening - 01/29/22 1016       Initial Review   Current issues with None Identified      Family Dynamics   Good Support System? Yes   husband, children near by  Barriers   Psychosocial barriers to participate in program There are no identifiable barriers or psychosocial needs.      Screening Interventions   Interventions Encouraged to exercise;To provide support and resources with identified psychosocial needs;Provide feedback about the scores to participant    Expected Outcomes Short Term goal: Utilizing psychosocial counselor, staff and physician to assist with identification of specific Stressors or current issues interfering with healing process. Setting desired goal for each stressor or current issue identified.;Long Term Goal: Stressors or current issues are controlled or eliminated.;Short Term goal: Identification and review with participant of any Quality of Life or Depression concerns found by scoring the questionnaire.;Long Term goal: The participant improves quality of Life and PHQ9 Scores as seen by post scores and/or verbalization of changes             Quality of Life Scores:   Quality of Life - 01/30/22 1057       Quality of Life   Select Quality of Life      Quality of Life Scores   Health/Function Pre 20 %    Socioeconomic Pre 25.63 %    Psych/Spiritual Pre 23.93 %    Family Pre 30 %    GLOBAL Pre 23.5 %            Scores of 19 and below usually indicate a poorer quality of life in these areas.  A difference of  2-3 points is a clinically meaningful difference.  A difference of 2-3 points in the total score of the Quality of Life Index has been associated with significant improvement in overall quality of life, self-image, physical symptoms, and general health in studies assessing change in quality of life.  PHQ-9: Review  Flowsheet       01/30/2022 11/17/2017  Depression screen PHQ 2/9  Decreased Interest 0 0  Down, Depressed, Hopeless 0 0  PHQ - 2 Score 0 0  Altered sleeping 1 -  Tired, decreased energy 1 -  Change in appetite 0 -  Feeling bad or failure about yourself  0 -  Trouble concentrating 0 -  Moving slowly or fidgety/restless 0 -  Suicidal thoughts 0 -  PHQ-9 Score 2 -  Difficult doing work/chores Not difficult at all -   Interpretation of Total Score  Total Score Depression Severity:  1-4 = Minimal depression, 5-9 = Mild depression, 10-14 = Moderate depression, 15-19 = Moderately severe depression, 20-27 = Severe depression   Psychosocial Evaluation and Intervention:  Psychosocial Evaluation - 01/29/22 1029       Psychosocial Evaluation & Interventions   Interventions Encouraged to exercise with the program and follow exercise prescription    Comments PAm has no barriers to attending the program. She is looking forward to returning to an exercise routine. 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  She stated she needs to build herself back up with her exercise. She lives with her husband and they have a 100 lb Lab. Her husband and children are her support.  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   She does have TYpe 2 Diabetes and at this time is diet controlled. She lost some weight in attempt to help control her Diabetes. She should do well with the program    Expected Outcomes STG Pam attends all scheduled sessions. she is able to progress with her exercise prescription and get back to more her exercise routine. LTG Pam  continues to progress with her exercise routine.    Continue Psychosocial Services  Follow up required by staff             Psychosocial Re-Evaluation:   Psychosocial Discharge (Final Psychosocial Re-Evaluation):   Vocational Rehabilitation: Provide vocational rehab assistance to  qualifying candidates.   Vocational Rehab Evaluation & Intervention:  Vocational Rehab - 01/29/22 1024       Initial Vocational Rehab Evaluation & Intervention   Assessment shows need for Vocational Rehabilitation No      Vocational Rehab Re-Evaulation   Comments retired             Education: Education Goals: Education classes will be provided on a variety of topics geared toward better understanding of heart health and risk factor modification. Participant will state understanding/return demonstration of topics presented as noted by education test scores.  Learning Barriers/Preferences:   General Cardiac Education Topics:  AED/CPR: - Group verbal and written instruction with the use of models to demonstrate the basic use of the AED with the basic ABC's of resuscitation.   Anatomy and Cardiac Procedures: - Group verbal and visual presentation and models provide information about basic cardiac anatomy and function. Reviews the testing methods done to diagnose heart disease and the outcomes of the test results. Describes the treatment choices: Medical Management, Angioplasty, or Coronary Bypass Surgery for treating various heart conditions including Myocardial Infarction, Angina, Valve Disease, and Cardiac Arrhythmias.  Written material given at graduation. Flowsheet Row Cardiac Rehab from 01/30/2022 in [facility] Cardiac and Pulmonary Rehab  Education need identified 01/30/22       Medication Safety: - Group verbal and visual instruction to review commonly prescribed medications for heart and lung disease. Reviews the medication, class of the drug, and side effects. Includes the steps to properly store meds and maintain the prescription regimen.  Written material given at graduation.   Intimacy: - Group verbal instruction through game format to discuss how heart and lung disease can affect sexual intimacy. Written material given at graduation..   Know Your Numbers and Heart  Failure: - Group verbal and visual instruction to discuss disease risk factors for cardiac and pulmonary disease and treatment options.  Reviews associated critical values for Overweight/Obesity, Hypertension, Cholesterol, and Diabetes.  Discusses basics of heart failure: signs/symptoms and treatments.  Introduces Heart Failure Zone chart for action plan for heart failure.  Written material given at graduation.   Infection Prevention: - Provides verbal and written material to individual with discussion of infection control including proper hand washing and proper equipment cleaning during exercise session. Flowsheet Row Cardiac Rehab from 01/30/2022 in [facility] Cardiac and Pulmonary Rehab  Date 01/30/22  Educator NT  Instruction Review Code 1- Verbalizes Understanding       Falls Prevention: - Provides verbal and written material to individual with discussion of falls prevention and safety. Flowsheet Row Cardiac Rehab from 01/30/2022 in [facility] Cardiac and Pulmonary Rehab  Date 01/29/22  Educator SB  Instruction Review Code 1- Verbalizes Understanding       Other: -Provides group and verbal instruction on various topics (see comments)   Knowledge Questionnaire Score:  Knowledge Questionnaire Score - 01/30/22 1052       Knowledge Questionnaire Score   Pre Score 23/26             Core Components/Risk Factors/Patient Goals at Admission:  Personal Goals and Risk Factors at Admission - 01/30/22 1058       Core Components/Risk Factors/Patient Goals on Admission  Weight Management Yes    Admit Weight 136 lb 8 oz (61.9 kg)    Goal Weight: Long Term 135 lb (61.2 kg)    Expected Outcomes Short Term: Continue to assess and modify interventions until short term weight is achieved;Long Term: Adherence to nutrition and physical activity/exercise program aimed toward attainment of established weight goal;Weight Maintenance: Understanding of the daily nutrition guidelines, which includes  25-35% calories from fat, 7% or less cal from saturated fats, less than '200mg'$  cholesterol, less than 1.5gm of sodium, & 5 or more servings of fruits and vegetables daily    Diabetes Yes    Intervention Provide education about signs/symptoms and action to take for hypo/hyperglycemia.;Provide education about proper nutrition, including hydration, and aerobic/resistive exercise prescription along with prescribed medications to achieve blood glucose in normal ranges: Fasting glucose 65-99 mg/dL    Expected Outcomes Short Term: Participant verbalizes understanding of the signs/symptoms and immediate care of hyper/hypoglycemia, proper foot care and importance of medication, aerobic/resistive exercise and nutrition plan for blood glucose control.;Long Term: Attainment of HbA1C < 7%.    Hypertension Yes    Intervention Provide education on lifestyle modifcations including regular physical activity/exercise, weight management, moderate sodium restriction and increased consumption of fresh fruit, vegetables, and low fat dairy, alcohol moderation, and smoking cessation.;Monitor prescription use compliance.    Expected Outcomes Short Term: Continued assessment and intervention until BP is < 140/67m HG in hypertensive participants. < 130/875mHG in hypertensive participants with diabetes, heart failure or chronic kidney disease.;Long Term: Maintenance of blood pressure at goal levels.    Lipids Yes    Intervention Provide education and support for participant on nutrition & aerobic/resistive exercise along with prescribed medications to achieve LDL '70mg'$ , HDL >'40mg'$ .    Expected Outcomes Short Term: Participant states understanding of desired cholesterol values and is compliant with medications prescribed. Participant is following exercise prescription and nutrition guidelines.;Long Term: Cholesterol controlled with medications as prescribed, with individualized exercise RX and with personalized nutrition plan. Value  goals: LDL < '70mg'$ , HDL > 40 mg.             Education:Diabetes - Individual verbal and written instruction to review signs/symptoms of diabetes, desired ranges of glucose level fasting, after meals and with exercise. Acknowledge that pre and post exercise glucose checks will be done for 3 sessions at entry of program.   Core Components/Risk Factors/Patient Goals Review:    Core Components/Risk Factors/Patient Goals at Discharge (Final Review):    ITP Comments:  ITP Comments     Row Name 01/29/22 1028 01/30/22 1052         ITP Comments Virtual orientation call completed today. shehas an appointment on Date: 01/30/2022  for EP eval and gym Orientation.  [facility] . Completed 6MWT and gym orientation. Initial ITP created and sent for review to Dr. MaEmily FilbertMedical Director.               Comments: Initial ITP

## 2022-02-05 ENCOUNTER — Encounter: Payer: Self-pay | Admitting: *Deleted

## 2022-02-05 DIAGNOSIS — Z952 Presence of prosthetic heart valve: Secondary | ICD-10-CM

## 2022-02-05 NOTE — Addendum Note (Signed)
Encounter addended by: Markus Daft A on: 02/05/2022 1:21 PM  Actions taken: Imaging Exam ended

## 2022-02-05 NOTE — Progress Notes (Signed)
Cardiac Individual Treatment Plan  Patient Details  Name: Mandy Delacruz MRN: [id number] Date of Birth: July 29, 1945 Referring Provider:   Flowsheet Row Cardiac Rehab from 01/30/2022 in [facility] Cardiac and Pulmonary Rehab  Referring Provider [name] MD       Initial Encounter Date:  Flowsheet Row Cardiac Rehab from 01/30/2022 in [facility] Cardiac and Pulmonary Rehab  Date 01/30/22       Visit Diagnosis: S/P TAVR (transcatheter aortic valve replacement)  Patient's Home Medications on Admission:  Current Outpatient Medications:    ACCU-CHEK GUIDE test strip, 2 (two) times daily. as directed, Disp: , Rfl:    acetaminophen (TYLENOL) 650 MG CR tablet, Take 1,300 mg by mouth every 8 (eight) hours as needed for pain., Disp: , Rfl:    albuterol (VENTOLIN HFA) 108 (90 Base) MCG/ACT inhaler, Inhale 2 puffs into the lungs every 6 (six) hours as needed for wheezing or shortness of breath., Disp: , Rfl:    amoxicillin (AMOXIL) 500 MG tablet, Take 4 tablets (2,000 mg total) by mouth as directed. 1 hour prior to dental work including cleanings, Disp: 12 tablet, Rfl: 12   aspirin 81 MG chewable tablet, Chew 1 tablet (81 mg total) by mouth daily. Okay to swallow whole., Disp: , Rfl:    diphenhydrAMINE (BENADRYL) 25 MG tablet, Take 25 mg by mouth at bedtime., Disp: , Rfl:    ferrous sulfate 325 (65 FE) MG tablet, Take 1 tablet (325 mg total) by mouth daily., Disp: 30 tablet, Rfl: 3   fexofenadine (ALLEGRA) 180 MG tablet, Take 180 mg by mouth daily as needed for allergies or rhinitis., Disp: , Rfl:    losartan-hydrochlorothiazide (HYZAAR) 50-12.5 MG tablet, Take 1 tablet by mouth daily., Disp: 90 tablet, Rfl: 3   pantoprazole (PROTONIX) 40 MG tablet, Take 1 tablet (40 mg total) by mouth daily. (Patient taking differently: Take 40 mg by mouth at bedtime.), Disp: 30 tablet, Rfl: 2   pravastatin (PRAVACHOL) 40 MG tablet, TAKE 1 TABLET(40 MG) BY MOUTH EVERY EVENING, Disp: 90 tablet, Rfl: 0   SYMBICORT 80-4.5  MCG/ACT inhaler, Take 2 puffs by mouth 2 (two) times daily. (Patient not taking: Reported on 01/29/2022), Disp: , Rfl: 11  Past Medical History: Past Medical History:  Diagnosis Date   Anemia    Asthma    Bronchitis    on bactrin since 12-27-14   Colon polyps 2001   Diverticulosis of colon (without mention of hemorrhage) 2001   Dizziness    inner ear problem at times, saw dr  [name] for   GERD (gastroesophageal reflux disease)    GI bleed    Hiatal hernia 2001   Hyperlipidemia    Hypertension    IH (inguinal hernia)    left   Lung nodule    Other esophagitis 2001   Severe aortic stenosis    Tobacco abuse     Tobacco Use: Social History   Tobacco Use  Smoking Status Former   Years: 45.00   Types: Cigarettes  Smokeless Tobacco Never  Tobacco Comments   maybe 5-10 cigarettes a day  starts and stops smoking every few days    Labs: Review Flowsheet       Latest Ref Rng & Units 10/25/2021 12/19/2021 01/14/2022  Labs for ITP Cardiac and Pulmonary Rehab  Hemoglobin A1c 4.8 - 5.6 % 6.0  - -  PH, Arterial 7.35 - 7.45 - 7.391  -  PCO2 arterial 32 - 48 mmHg - 34.7  -  Bicarbonate 20.0 -  28.0 mmol/L - 25.0  21.1  -  TCO2 22 - 32 mmol/L - '26  22  22  24   '$ Acid-base deficit 0.0 - 2.0 mmol/L - 1.0  3.0  -  O2 Saturation % - 67  94  -     Exercise Target Goals: Exercise Program Goal: Individual exercise prescription set using results from initial 6 min walk test and THRR while considering  patient's activity barriers and safety.   Exercise Prescription Goal: Initial exercise prescription builds to 30-45 minutes a day of aerobic activity, 2-3 days per week.  Home exercise guidelines will be given to patient during program as part of exercise prescription that the participant will acknowledge.   Education: Aerobic Exercise: - Group verbal and visual presentation on the components of exercise prescription. Introduces F.I.T.T principle from ACSM for exercise  prescriptions.  Reviews F.I.T.T. principles of aerobic exercise including progression. Written material given at graduation.   Education: Resistance Exercise: - Group verbal and visual presentation on the components of exercise prescription. Introduces F.I.T.T principle from ACSM for exercise prescriptions  Reviews F.I.T.T. principles of resistance exercise including progression. Written material given at graduation.    Education: Exercise & Equipment Safety: - Individual verbal instruction and demonstration of equipment use and safety with use of the equipment. Flowsheet Row Cardiac Rehab from 01/30/2022 in [facility] Cardiac and Pulmonary Rehab  Date 01/30/22  Educator NT  Instruction Review Code 1- Verbalizes Understanding       Education: Exercise Physiology & General Exercise Guidelines: - Group verbal and written instruction with models to review the exercise physiology of the cardiovascular system and associated critical values. Provides general exercise guidelines with specific guidelines to those with heart or lung disease.    Education: Flexibility, Balance, Mind/Body Relaxation: - Group verbal and visual presentation with interactive activity on the components of exercise prescription. Introduces F.I.T.T principle from ACSM for exercise prescriptions. Reviews F.I.T.T. principles of flexibility and balance exercise training including progression. Also discusses the mind body connection.  Reviews various relaxation techniques to help reduce and manage stress (i.e. Deep breathing, progressive muscle relaxation, and visualization). Balance handout provided to take home. Written material given at graduation.   Activity Barriers & Risk Stratification:  Activity Barriers & Cardiac Risk Stratification - 01/30/22 1333       Activity Barriers & Cardiac Risk Stratification   Activity Barriers Arthritis;Back Problems;Joint Problems;Muscular Weakness   left foot numbness   Cardiac Risk  Stratification Low             6 Minute Walk:  6 Minute Walk     Row Name 01/30/22 1332         6 Minute Walk   Phase Initial     Distance 1210 feet     Walk Time 6 minutes     # of Rest Breaks 0     MPH 2.29     METS 2.97     RPE 12     Perceived Dyspnea  0     VO2 Peak 10.39     Symptoms Yes (comment)     Comments Calf tightness     Resting HR 63 bpm     Resting BP 140/66     Resting Oxygen Saturation  99 %     Exercise Oxygen Saturation  during 6 min walk 98 %     Max Ex. HR 104 bpm     Max Ex. BP 168/74     2 Minute Post BP  150/70              Oxygen Initial Assessment:   Oxygen Re-Evaluation:   Oxygen Discharge (Final Oxygen Re-Evaluation):   Initial Exercise Prescription:  Initial Exercise Prescription - 01/30/22 1300       Date of Initial Exercise RX and Referring Provider   Date 01/30/22    Referring Provider Croitoru, Mihai MD      Oxygen   Maintain Oxygen Saturation 88% or higher      Treadmill   MPH 2.1    Grade 1    Minutes 15    METs 2.9      Recumbant Bike   Level 2    RPM 50    Watts 19    Minutes 15    METs 2.97      NuStep   Level 2    SPM 80    Minutes 15    METs 2.97      Prescription Details   Frequency (times per week) 3    Duration Progress to 30 minutes of continuous aerobic without signs/symptoms of physical distress      Intensity   THRR 40-80% of Max Heartrate 95-128    Ratings of Perceived Exertion 11-13    Perceived Dyspnea 0-4      Progression   Progression Continue to progress workloads to maintain intensity without signs/symptoms of physical distress.      Resistance Training   Training Prescription Yes    Weight 3 lb    Reps 10-15             Perform Capillary Blood Glucose checks as needed.  Exercise Prescription Changes:   Exercise Prescription Changes     Row Name 01/30/22 1300             Response to Exercise   Blood Pressure (Admit) 140/66       Blood Pressure  (Exercise) 168/74       Blood Pressure (Exit) 150/70       Heart Rate (Admit) 63 bpm       Heart Rate (Exercise) 104 bpm       Heart Rate (Exit) 76 bpm       Oxygen Saturation (Admit) 99 %       Oxygen Saturation (Exercise) 98 %       Oxygen Saturation (Exit) 98 %       Rating of Perceived Exertion (Exercise) 12       Perceived Dyspnea (Exercise) 0       Symptoms Calf tightness       Comments 6MWT Results                Exercise Comments:   Exercise Goals and Review:   Exercise Goals     Row Name 01/30/22 1307             Exercise Goals   Increase Physical Activity Yes       Intervention Provide advice, education, support and counseling about physical activity/exercise needs.;Develop an individualized exercise prescription for aerobic and resistive training based on initial evaluation findings, risk stratification, comorbidities and participant's personal goals.       Expected Outcomes Short Term: Attend rehab on a regular basis to increase amount of physical activity.;Long Term: Add in home exercise to make exercise part of routine and to increase amount of physical activity.;Long Term: Exercising regularly at least 3-5 days a week.       Increase Strength and Stamina Yes  Intervention Provide advice, education, support and counseling about physical activity/exercise needs.;Develop an individualized exercise prescription for aerobic and resistive training based on initial evaluation findings, risk stratification, comorbidities and participant's personal goals.       Expected Outcomes Short Term: Increase workloads from initial exercise prescription for resistance, speed, and METs.;Short Term: Perform resistance training exercises routinely during rehab and add in resistance training at home;Long Term: Improve cardiorespiratory fitness, muscular endurance and strength as measured by increased METs and functional capacity (6MWT)       Able to understand and use rate of  perceived exertion (RPE) scale Yes       Intervention Provide education and explanation on how to use RPE scale       Expected Outcomes Short Term: Able to use RPE daily in rehab to express subjective intensity level;Long Term:  Able to use RPE to guide intensity level when exercising independently       Able to understand and use Dyspnea scale Yes       Intervention Provide education and explanation on how to use Dyspnea scale       Expected Outcomes Short Term: Able to use Dyspnea scale daily in rehab to express subjective sense of shortness of breath during exertion;Long Term: Able to use Dyspnea scale to guide intensity level when exercising independently       Knowledge and understanding of Target Heart Rate Range (THRR) Yes       Intervention Provide education and explanation of THRR including how the numbers were predicted and where they are located for reference       Expected Outcomes Short Term: Able to state/look up THRR;Long Term: Able to use THRR to govern intensity when exercising independently;Short Term: Able to use daily as guideline for intensity in rehab       Able to check pulse independently Yes       Intervention Provide education and demonstration on how to check pulse in carotid and radial arteries.;Review the importance of being able to check your own pulse for safety during independent exercise       Expected Outcomes Short Term: Able to explain why pulse checking is important during independent exercise;Long Term: Able to check pulse independently and accurately       Understanding of Exercise Prescription Yes       Intervention Provide education, explanation, and written materials on patient's individual exercise prescription       Expected Outcomes Short Term: Able to explain program exercise prescription;Long Term: Able to explain home exercise prescription to exercise independently                Exercise Goals Re-Evaluation :   Discharge Exercise Prescription  (Final Exercise Prescription Changes):  Exercise Prescription Changes - 01/30/22 1300       Response to Exercise   Blood Pressure (Admit) 140/66    Blood Pressure (Exercise) 168/74    Blood Pressure (Exit) 150/70    Heart Rate (Admit) 63 bpm    Heart Rate (Exercise) 104 bpm    Heart Rate (Exit) 76 bpm    Oxygen Saturation (Admit) 99 %    Oxygen Saturation (Exercise) 98 %    Oxygen Saturation (Exit) 98 %    Rating of Perceived Exertion (Exercise) 12    Perceived Dyspnea (Exercise) 0    Symptoms Calf tightness    Comments 6MWT Results             Nutrition:  Target Goals: Understanding of nutrition guidelines,  daily intake of sodium '1500mg'$ , cholesterol '200mg'$ , calories 30% from fat and 7% or less from saturated fats, daily to have 5 or more servings of fruits and vegetables.  Education: All About Nutrition: -Group instruction provided by verbal, written material, interactive activities, discussions, models, and posters to present general guidelines for heart healthy nutrition including fat, fiber, MyPlate, the role of sodium in heart healthy nutrition, utilization of the nutrition label, and utilization of this knowledge for meal planning. Follow up email sent as well. Written material given at graduation. Flowsheet Row Cardiac Rehab from 01/30/2022 in [facility] Cardiac and Pulmonary Rehab  Education need identified 01/30/22       Biometrics:  Pre Biometrics - 01/30/22 1334       Pre Biometrics   Height 5' 4.25" (1.632 m)    Weight 136 lb 8 oz (61.9 kg)    Waist Circumference 37 inches    Hip Circumference 37.5 inches    Waist to Hip Ratio 0.99 %    BMI (Calculated) 23.25    Single Leg Stand 11.8 seconds   L             Nutrition Therapy Plan and Nutrition Goals:  Nutrition Therapy & Goals - 01/30/22 1058       Intervention Plan   Intervention Prescribe, educate and counsel regarding individualized specific dietary modifications aiming towards targeted core  components such as weight, hypertension, lipid management, diabetes, heart failure and other comorbidities.    Expected Outcomes Short Term Goal: Understand basic principles of dietary content, such as calories, fat, sodium, cholesterol and nutrients.;Short Term Goal: A plan has been developed with personal nutrition goals set during dietitian appointment.;Long Term Goal: Adherence to prescribed nutrition plan.             Nutrition Assessments:  MEDIFICTS Score Key: ?70 Need to make dietary changes  40-70 Heart Healthy Diet ? 40 Therapeutic Level Cholesterol Diet  Flowsheet Row Cardiac Rehab from 01/30/2022 in [facility] Cardiac and Pulmonary Rehab  Picture Your Plate Total Score on Admission 55      Picture Your Plate Scores: <56 Unhealthy dietary pattern with much room for improvement. 41-50 Dietary pattern unlikely to meet recommendations for good health and room for improvement. 51-60 More healthful dietary pattern, with some room for improvement.  >60 Healthy dietary pattern, although there may be some specific behaviors that could be improved.    Nutrition Goals Re-Evaluation:   Nutrition Goals Discharge (Final Nutrition Goals Re-Evaluation):   Psychosocial: Target Goals: Acknowledge presence or absence of significant depression and/or stress, maximize coping skills, provide positive support system. Participant is able to verbalize types and ability to use techniques and skills needed for reducing stress and depression.   Education: Stress, Anxiety, and Depression - Group verbal and visual presentation to define topics covered.  Reviews how body is impacted by stress, anxiety, and depression.  Also discusses healthy ways to reduce stress and to treat/manage anxiety and depression.  Written material given at graduation.   Education: Sleep Hygiene -Provides group verbal and written instruction about how sleep can affect your health.  Define sleep hygiene, discuss sleep cycles  and impact of sleep habits. Review good sleep hygiene tips.    Initial Review & Psychosocial Screening:  Initial Psych Review & Screening - 01/29/22 1016       Initial Review   Current issues with None Identified      Family Dynamics   Good Support System? Yes   husband, children near by  Barriers   Psychosocial barriers to participate in program There are no identifiable barriers or psychosocial needs.      Screening Interventions   Interventions Encouraged to exercise;To provide support and resources with identified psychosocial needs;Provide feedback about the scores to participant    Expected Outcomes Short Term goal: Utilizing psychosocial counselor, staff and physician to assist with identification of specific Stressors or current issues interfering with healing process. Setting desired goal for each stressor or current issue identified.;Long Term Goal: Stressors or current issues are controlled or eliminated.;Short Term goal: Identification and review with participant of any Quality of Life or Depression concerns found by scoring the questionnaire.;Long Term goal: The participant improves quality of Life and PHQ9 Scores as seen by post scores and/or verbalization of changes             Quality of Life Scores:   Quality of Life - 01/30/22 1057       Quality of Life   Select Quality of Life      Quality of Life Scores   Health/Function Pre 20 %    Socioeconomic Pre 25.63 %    Psych/Spiritual Pre 23.93 %    Family Pre 30 %    GLOBAL Pre 23.5 %            Scores of 19 and below usually indicate a poorer quality of life in these areas.  A difference of  2-3 points is a clinically meaningful difference.  A difference of 2-3 points in the total score of the Quality of Life Index has been associated with significant improvement in overall quality of life, self-image, physical symptoms, and general health in studies assessing change in quality of life.  PHQ-9: Review  Flowsheet       01/30/2022 11/17/2017  Depression screen PHQ 2/9  Decreased Interest 0 0  Down, Depressed, Hopeless 0 0  PHQ - 2 Score 0 0  Altered sleeping 1 -  Tired, decreased energy 1 -  Change in appetite 0 -  Feeling bad or failure about yourself  0 -  Trouble concentrating 0 -  Moving slowly or fidgety/restless 0 -  Suicidal thoughts 0 -  PHQ-9 Score 2 -  Difficult doing work/chores Not difficult at all -   Interpretation of Total Score  Total Score Depression Severity:  1-4 = Minimal depression, 5-9 = Mild depression, 10-14 = Moderate depression, 15-19 = Moderately severe depression, 20-27 = Severe depression   Psychosocial Evaluation and Intervention:  Psychosocial Evaluation - 01/29/22 1029       Psychosocial Evaluation & Interventions   Interventions Encouraged to exercise with the program and follow exercise prescription    Comments Mandy Delacruz has no barriers to attending the program. She is looking forward to returning to an exercise routine. 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  She stated she needs to build herself back up with her exercise. She lives with her husband and they have a 100 lb Lab. Her husband and children are her support.  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   She does have TYpe 2 Diabetes and at this time is diet controlled. She lost some weight in attempt to help control her Diabetes. She should do well with the program    Expected Outcomes STG Mandy Delacruz attends all scheduled sessions. she is able to progress with her exercise prescription and get back to more her exercise routine. LTG Mandy Delacruz  continues to progress with her exercise routine.    Continue Psychosocial Services  Follow up required by staff             Psychosocial Re-Evaluation:   Psychosocial Discharge (Final Psychosocial Re-Evaluation):   Vocational Rehabilitation: Provide vocational rehab assistance to  qualifying candidates.   Vocational Rehab Evaluation & Intervention:  Vocational Rehab - 01/29/22 1024       Initial Vocational Rehab Evaluation & Intervention   Assessment shows need for Vocational Rehabilitation No      Vocational Rehab Re-Evaulation   Comments retired             Education: Education Goals: Education classes will be provided on a variety of topics geared toward better understanding of heart health and risk factor modification. Participant will state understanding/return demonstration of topics presented as noted by education test scores.  Learning Barriers/Preferences:   General Cardiac Education Topics:  AED/CPR: - Group verbal and written instruction with the use of models to demonstrate the basic use of the AED with the basic ABC's of resuscitation.   Anatomy and Cardiac Procedures: - Group verbal and visual presentation and models provide information about basic cardiac anatomy and function. Reviews the testing methods done to diagnose heart disease and the outcomes of the test results. Describes the treatment choices: Medical Management, Angioplasty, or Coronary Bypass Surgery for treating various heart conditions including Myocardial Infarction, Angina, Valve Disease, and Cardiac Arrhythmias.  Written material given at graduation. Flowsheet Row Cardiac Rehab from 01/30/2022 in [facility] Cardiac and Pulmonary Rehab  Education need identified 01/30/22       Medication Safety: - Group verbal and visual instruction to review commonly prescribed medications for heart and lung disease. Reviews the medication, class of the drug, and side effects. Includes the steps to properly store meds and maintain the prescription regimen.  Written material given at graduation.   Intimacy: - Group verbal instruction through game format to discuss how heart and lung disease can affect sexual intimacy. Written material given at graduation..   Know Your Numbers and Heart  Failure: - Group verbal and visual instruction to discuss disease risk factors for cardiac and pulmonary disease and treatment options.  Reviews associated critical values for Overweight/Obesity, Hypertension, Cholesterol, and Diabetes.  Discusses basics of heart failure: signs/symptoms and treatments.  Introduces Heart Failure Zone chart for action plan for heart failure.  Written material given at graduation.   Infection Prevention: - Provides verbal and written material to individual with discussion of infection control including proper hand washing and proper equipment cleaning during exercise session. Flowsheet Row Cardiac Rehab from 01/30/2022 in [facility] Cardiac and Pulmonary Rehab  Date 01/30/22  Educator NT  Instruction Review Code 1- Verbalizes Understanding       Falls Prevention: - Provides verbal and written material to individual with discussion of falls prevention and safety. Flowsheet Row Cardiac Rehab from 01/30/2022 in [facility] Cardiac and Pulmonary Rehab  Date 01/29/22  Educator SB  Instruction Review Code 1- Verbalizes Understanding       Other: -Provides group and verbal instruction on various topics (see comments)   Knowledge Questionnaire Score:  Knowledge Questionnaire Score - 01/30/22 1052       Knowledge Questionnaire Score   Pre Score 23/26             Core Components/Risk Factors/Patient Goals at Admission:  Personal Goals and Risk Factors at Admission - 01/30/22 1058       Core Components/Risk Factors/Patient Goals on Admission  Weight Management Yes    Admit Weight 136 lb 8 oz (61.9 kg)    Goal Weight: Long Term 135 lb (61.2 kg)    Expected Outcomes Short Term: Continue to assess and modify interventions until short term weight is achieved;Long Term: Adherence to nutrition and physical activity/exercise program aimed toward attainment of established weight goal;Weight Maintenance: Understanding of the daily nutrition guidelines, which includes  25-35% calories from fat, 7% or less cal from saturated fats, less than '200mg'$  cholesterol, less than 1.5gm of sodium, & 5 or more servings of fruits and vegetables daily    Diabetes Yes    Intervention Provide education about signs/symptoms and action to take for hypo/hyperglycemia.;Provide education about proper nutrition, including hydration, and aerobic/resistive exercise prescription along with prescribed medications to achieve blood glucose in normal ranges: Fasting glucose 65-99 mg/dL    Expected Outcomes Short Term: Participant verbalizes understanding of the signs/symptoms and immediate care of hyper/hypoglycemia, proper foot care and importance of medication, aerobic/resistive exercise and nutrition plan for blood glucose control.;Long Term: Attainment of HbA1C < 7%.    Hypertension Yes    Intervention Provide education on lifestyle modifcations including regular physical activity/exercise, weight management, moderate sodium restriction and increased consumption of fresh fruit, vegetables, and low fat dairy, alcohol moderation, and smoking cessation.;Monitor prescription use compliance.    Expected Outcomes Short Term: Continued assessment and intervention until BP is < 140/95m HG in hypertensive participants. < 130/852mHG in hypertensive participants with diabetes, heart failure or chronic kidney disease.;Long Term: Maintenance of blood pressure at goal levels.    Lipids Yes    Intervention Provide education and support for participant on nutrition & aerobic/resistive exercise along with prescribed medications to achieve LDL '70mg'$ , HDL >'40mg'$ .    Expected Outcomes Short Term: Participant states understanding of desired cholesterol values and is compliant with medications prescribed. Participant is following exercise prescription and nutrition guidelines.;Long Term: Cholesterol controlled with medications as prescribed, with individualized exercise RX and with personalized nutrition plan. Value  goals: LDL < '70mg'$ , HDL > 40 mg.             Education:Diabetes - Individual verbal and written instruction to review signs/symptoms of diabetes, desired ranges of glucose level fasting, after meals and with exercise. Acknowledge that pre and post exercise glucose checks will be done for 3 sessions at entry of program.   Core Components/Risk Factors/Patient Goals Review:    Core Components/Risk Factors/Patient Goals at Discharge (Final Review):    ITP Comments:  ITP Comments     Row Name 01/29/22 1028 01/30/22 1052 02/05/22 1104       ITP Comments Virtual orientation call completed today. shehas an appointment on Date: 01/30/2022  for EP eval and gym Orientation.  [facility] . Completed 6MWT and gym orientation. Initial ITP created and sent for review to Dr. MaEmily FilbertMedical Director. 30 Day review completed. Medical Director ITP review done, changes made as directed, and signed approval by Medical Director.   new to program              Comments:

## 2022-02-11 DIAGNOSIS — I129 Hypertensive chronic kidney disease with stage 1 through stage 4 chronic kidney disease, or unspecified chronic kidney disease: Secondary | ICD-10-CM | POA: Diagnosis not present

## 2022-02-11 DIAGNOSIS — J449 Chronic obstructive pulmonary disease, unspecified: Secondary | ICD-10-CM | POA: Diagnosis not present

## 2022-02-11 DIAGNOSIS — R202 Paresthesia of skin: Secondary | ICD-10-CM | POA: Diagnosis not present

## 2022-02-13 ENCOUNTER — Encounter: Payer: BC Managed Care – PPO | Attending: Cardiovascular Disease | Admitting: *Deleted

## 2022-02-13 DIAGNOSIS — Z952 Presence of prosthetic heart valve: Secondary | ICD-10-CM | POA: Diagnosis not present

## 2022-02-13 NOTE — Progress Notes (Signed)
Daily Session Note  Patient Details  Name: Mandy Delacruz MRN: [id number] Date of Birth: [birth year] Referring Provider:   Flowsheet Row Cardiac Rehab from 01/30/2022 in [facility] Cardiac and Pulmonary Rehab  Referring Provider [name] MD       Encounter Date: 02/13/2022  Check In:  Session Check In - 02/13/22 1119       Check-In   Supervising physician immediately available to respond to emergencies See telemetry face sheet for immediately available ER MD    Location [facility]-Cardiac & Pulmonary Rehab    Staff Present Darlyne Russian, RN, ADN;Meredith Sherryll Burger, RN Abel Presto, MS, ACSM CEP, Exercise Physiologist;Joseph Tessie Fass, [place]    Virtual Visit No    Medication changes reported     No    Fall or balance concerns reported    No    Warm-up and Cool-down Performed on first and last piece of equipment    Resistance Training Performed Yes    VAD Patient? No    PAD/SET Patient? No      Pain Assessment   Currently in Pain? No/denies                Social History   Tobacco Use  Smoking Status Former   Years: 45.00   Types: Cigarettes  Smokeless Tobacco Never  Tobacco Comments   maybe 5-10 cigarettes a day  starts and stops smoking every few days    Goals Met:  Independence with exercise equipment Exercise tolerated well No report of concerns or symptoms today Strength training completed today  Goals Unmet:  Not Applicable  Comments: First full day of exercise!  Patient was oriented to gym and equipment including functions, settings, policies, and procedures.  Patient's individual exercise prescription and treatment plan were reviewed.  All starting workloads were established based on the results of the 6 minute walk test done at initial orientation visit.  The plan for exercise progression was also introduced and progression will be customized based on patient's performance and goals.    Dr. Emily Filbert is Medical Director for [place].  Dr. Ottie Glazier is Medical Director for [facility] Pulmonary Rehabilitation.

## 2022-02-14 ENCOUNTER — Encounter: Payer: BC Managed Care – PPO | Admitting: *Deleted

## 2022-02-14 DIAGNOSIS — Z952 Presence of prosthetic heart valve: Secondary | ICD-10-CM

## 2022-02-14 NOTE — Progress Notes (Signed)
Daily Session Note  Patient Details  Name: Mandy Delacruz MRN: [id number] Date of Birth: 07/12/1945 Referring Provider:   Flowsheet Row Cardiac Rehab from 01/30/2022 in [facility] Cardiac and Pulmonary Rehab  Referring Provider [name] MD       Encounter Date: 02/14/2022  Check In:  Session Check In - 02/14/22 1142       Check-In   Supervising physician immediately available to respond to emergencies See telemetry face sheet for immediately available ER MD    Location [facility]-Cardiac & Pulmonary Rehab    Staff Present Heath Lark, RN, BSN, CCRP;Jessica Melbourne, MA, RCEP, CCRP, CCET;Joseph [place], [place]    Virtual Visit No    Medication changes reported     No    Fall or balance concerns reported    No    Warm-up and Cool-down Performed on first and last piece of equipment    Resistance Training Performed Yes    VAD Patient? No    PAD/SET Patient? No      Pain Assessment   Currently in Pain? No/denies                Social History   Tobacco Use  Smoking Status Former   Years: 45.00   Types: Cigarettes  Smokeless Tobacco Never  Tobacco Comments   maybe 5-10 cigarettes a day  starts and stops smoking every few days    Goals Met:  Exercise tolerated well No report of concerns or symptoms today  Goals Unmet:  Not Applicable  Comments: Pt able to follow exercise prescription today without complaint.  Will continue to monitor for progression.    Dr. Emily Filbert is Medical Director for [place].  Dr. Ottie Glazier is Medical Director for [facility] Pulmonary Rehabilitation.

## 2022-02-18 ENCOUNTER — Encounter: Payer: BC Managed Care – PPO | Admitting: *Deleted

## 2022-02-18 DIAGNOSIS — Z952 Presence of prosthetic heart valve: Secondary | ICD-10-CM

## 2022-02-18 NOTE — Progress Notes (Signed)
Daily Session Note  Patient Details  Name: Mandy Delacruz MRN: [id number] Date of Birth: Oct 17, 1945 Referring Provider:   Flowsheet Row Cardiac Rehab from 01/30/2022 in [facility] Cardiac and Pulmonary Rehab  Referring Provider [name] MD       Encounter Date: 02/18/2022  Check In:  Session Check In - 02/18/22 1053       Check-In   Supervising physician immediately available to respond to emergencies See telemetry face sheet for immediately available ER MD    Location [facility]-Cardiac & Pulmonary Rehab    Staff Present Renita Papa, RN BSN;Jessica Luan Pulling, MA, RCEP, CCRP, Bertram Gala, MS, ACSM CEP, Exercise Physiologist    Virtual Visit No    Fall or balance concerns reported    No    Warm-up and Cool-down Performed on first and last piece of equipment    Resistance Training Performed Yes    VAD Patient? No    PAD/SET Patient? No      Pain Assessment   Currently in Pain? No/denies                Social History   Tobacco Use  Smoking Status Former   Years: 45.00   Types: Cigarettes  Smokeless Tobacco Never  Tobacco Comments   maybe 5-10 cigarettes a day  starts and stops smoking every few days    Goals Met:  Independence with exercise equipment Exercise tolerated well No report of concerns or symptoms today Strength training completed today  Goals Unmet:  Not Applicable  Comments: Pt able to follow exercise prescription today without complaint.  Will continue to monitor for progression.  Reviewed home exercise with pt today.  Pt plans to walking and go to [facility] for exercise.  Reviewed THR, pulse, RPE, sign and symptoms, pulse oximetery and when to call 911 or MD.  Also discussed weather considerations and indoor options.  Pt voiced understanding.   Dr. Emily Filbert is Medical Director for [place].  Dr. Ottie Glazier is Medical Director for [facility] Pulmonary Rehabilitation.

## 2022-02-20 ENCOUNTER — Encounter: Payer: BC Managed Care – PPO | Admitting: *Deleted

## 2022-02-20 DIAGNOSIS — Z952 Presence of prosthetic heart valve: Secondary | ICD-10-CM | POA: Diagnosis not present

## 2022-02-20 NOTE — Progress Notes (Signed)
Daily Session Note  Patient Details  Name: Mandy Delacruz MRN: [id number] Date of Birth: 08-Jun-1945 Referring Provider:   Flowsheet Row Cardiac Rehab from 01/30/2022 in [facility] Cardiac and Pulmonary Rehab  Referring Provider [name] MD       Encounter Date: 02/20/2022  Check In:  Session Check In - 02/20/22 1034       Check-In   Supervising physician immediately available to respond to emergencies See telemetry face sheet for immediately available ER MD    Location [facility]-Cardiac & Pulmonary Rehab    Staff Present Renita Papa, RN BSN;Noah Tickle, BS, Exercise Physiologist;Kara Maricela Bo, MS, ACSM CEP, Exercise Physiologist    Virtual Visit No    Medication changes reported     No    Fall or balance concerns reported    No    Warm-up and Cool-down Performed on first and last piece of equipment    Resistance Training Performed Yes    VAD Patient? No    PAD/SET Patient? No      Pain Assessment   Currently in Pain? No/denies                Social History   Tobacco Use  Smoking Status Former   Years: 45.00   Types: Cigarettes  Smokeless Tobacco Never  Tobacco Comments   maybe 5-10 cigarettes a day  starts and stops smoking every few days    Goals Met:  Independence with exercise equipment Exercise tolerated well No report of concerns or symptoms today Strength training completed today  Goals Unmet:  Not Applicable  Comments: Pt able to follow exercise prescription today without complaint.  Will continue to monitor for progression.    Dr. Emily Filbert is Medical Director for [place].  Dr. Ottie Glazier is Medical Director for [facility] Pulmonary Rehabilitation.

## 2022-02-21 ENCOUNTER — Encounter: Payer: BC Managed Care – PPO | Admitting: *Deleted

## 2022-02-21 DIAGNOSIS — Z952 Presence of prosthetic heart valve: Secondary | ICD-10-CM | POA: Diagnosis not present

## 2022-02-21 NOTE — Progress Notes (Signed)
Daily Session Note  Patient Details  Name: FREE REISSIG MRN: [id number] Date of Birth: Jan 26, 1945 Referring Provider:   Flowsheet Row Cardiac Rehab from 01/30/2022 in [facility] Cardiac and Pulmonary Rehab  Referring Provider [name] MD       Encounter Date: 02/21/2022  Check In:  Session Check In - 02/21/22 1138       Check-In   Supervising physician immediately available to respond to emergencies See telemetry face sheet for immediately available ER MD    Location [facility]-Cardiac & Pulmonary Rehab    Staff Present Heath Lark, RN, BSN, CCRP;Jessica Morris, MA, RCEP, CCRP, CCET;Joseph [place], [place]    Virtual Visit No    Medication changes reported     No    Fall or balance concerns reported    No    Warm-up and Cool-down Performed on first and last piece of equipment    Resistance Training Performed Yes    VAD Patient? No    PAD/SET Patient? No      Pain Assessment   Currently in Pain? No/denies                Social History   Tobacco Use  Smoking Status Former   Years: 45.00   Types: Cigarettes  Smokeless Tobacco Never  Tobacco Comments   maybe 5-10 cigarettes a day  starts and stops smoking every few days    Goals Met:  Independence with exercise equipment Exercise tolerated well No report of concerns or symptoms today  Goals Unmet:  Not Applicable  Comments: Pt able to follow exercise prescription today without complaint.  Will continue to monitor for progression.    Dr. Emily Filbert is Medical Director for [place].  Dr. Ottie Glazier is Medical Director for [facility] Pulmonary Rehabilitation.

## 2022-02-22 NOTE — Progress Notes (Unsigned)
HEART AND [place]                                     Cardiology Office Note:    Date:  02/25/2022   ID:  Mandy Delacruz, DOB Mar 05, 1945, MRN [id number]  PCP:  [name], MD  [facility] Cardiologist:  [name], MD/ Dr. Ali Lowe, MD and Dr. Lavonna Monarch, MD (TAVR)   [facility] Electrophysiologist:  None   Referring MD: [name], MD   Chief Complaint  Patient presents with   Follow-up    One month s/p TAVR   History of Present Illness:    Mandy Delacruz is a 77 y.o. female with a hx of hiatal hernia, GERD, DMT2, HTN, history of smoking with COPD, AVMS with recent GI bleed, IDA and critical AS s/p TAVR (01/14/22) who presents to clinic for one month follow up.    Ms. Dangel was hospitalized on 10/24/2021 with anemia and melena (hemoglobin as low as 6.7), which led to exertional dyspnea and exertional chest tightness. After receiving a 2-unit PRBC blood transfusion the dyspnea and chest discomfort resolved.  Labs confirmed iron deficiency and hg improved on supplementation. She underwent upper and lower endoscopy that did not clearly show the source of the bleeding.  She had been taking ibuprofen "every single day" for a couple of years.     Repeat echo 12/12/21 showed EF 65%, severe LVH of the basal-septal segment, and critical AS with mean grad 67 mmHg, peak grad 113 mmHg, AVA 0.58 cm2, DVI 0.23, as well as severe MAC. She was referred for structural heart consultation. [facility] 12/19/21 showed no angiographic evidence of CAD as well as normal right and left heart pressures.    The patient has been evaluated by the multidisciplinary valve team and underwent successful TAVR with a 23 mm Edwards Sapien 3 Ultra Resilia THV via the TF approach on 01/14/22. Post operative echo showed EF >75%, normally functioning TAVR with a mean gradient of 10 mmHg and no PVL as well as moderate to severe MAC.  Started on a baby Asprin alone. Given sinus brady  into the 20s, she was discharged with a ZIO AT which showed NSR with no severe bradycardia, one single 3 second pause due to sinus node dysfunction, with junctional escape with no evidence of high-grade atrioventricular block. There was a brief episode of supraventricular tachycardia (total of 14 seconds of consecutive brief bursts of paroxysmal atrial tachycardia). No changes were made at that time.   She is here today and reports that she has been well overall. She is back from the [place]. While there, she did have an isolated episode of single hand numbness with no other neuro changes. This has since resolved and has not returned. She had been started on Symbicort and feels this caused it. She has also stopped this medication. Additionally she reports that several days ago she had a strange swelling in her left neck that lasted several hours then disappeared. This happened separately from the hand numbness. She does have a bruit on carotid exam. Otherwise no chest pain, palpitations, dizziness, SOB, LE edema, bleeding in stool or urine, or syncope.   Past Medical History:  Diagnosis Date   Anemia    Asthma    Bronchitis    on bactrin since 12-27-14   Colon polyps 2001   Diverticulosis of colon (without  mention of hemorrhage) 2001   Dizziness    inner ear problem at times, saw dr  [name] for   GERD (gastroesophageal reflux disease)    GI bleed    Hiatal hernia 2001   Hyperlipidemia    Hypertension    IH (inguinal hernia)    left   Lung nodule    Other esophagitis 2001   Severe aortic stenosis    Tobacco abuse     Past Surgical History:  Procedure Laterality Date   breast biospy     left, benign   CHOLECYSTECTOMY     COLONOSCOPY     COLONOSCOPY WITH PROPOFOL N/A 10/26/2021   Procedure: COLONOSCOPY WITH PROPOFOL;  Surgeon: Doran Stabler, MD;  Location: [place];  Service: Gastroenterology;  Laterality: N/A;   CORONARY ANGIOGRAPHY N/A 01/14/2022   Procedure:  CORONARY ANGIOGRAPHY;  Surgeon: [name], MD;  Location: [place] CV LAB;  Service: Open Heart Surgery;  Laterality: N/A;   ENDOVENOUS ABLATION SAPHENOUS VEIN W/ LASER Left 04/29/2016   endovenous laser ablation L SSV and stab phlebectomy left leg by Tinnie Gens MD    ESOPHAGOGASTRODUODENOSCOPY (EGD) WITH PROPOFOL N/A 10/25/2021   Procedure: ESOPHAGOGASTRODUODENOSCOPY (EGD) WITH PROPOFOL;  Surgeon: Sharyn Creamer, MD;  Location: [facility] ENDOSCOPY;  Service: Gastroenterology;  Laterality: N/A;   INGUINAL HERNIA REPAIR  oct 2015   left   INGUINAL HERNIA REPAIR Left 01/10/2015   Procedure: OPEN REPAIR OF RECURRENT LEFT INGUINAL HERNIA ;  Surgeon: Greer Pickerel, MD;  Location: [facility];  Service: General;  Laterality: Left;   INSERTION OF MESH Left 01/10/2015   Procedure: INSERTION OF MESH;  Surgeon: Greer Pickerel, MD;  Location: [facility];  Service: General;  Laterality: Left;   INSERTION OF MESH N/A 08/18/2017   Procedure: INSERTION OF MESH;  Surgeon: Greer Pickerel, MD;  Location: [place];  Service: General;  Laterality: N/A;   INTRAOPERATIVE TRANSTHORACIC ECHOCARDIOGRAM N/A 01/14/2022   Procedure: INTRAOPERATIVE TRANSTHORACIC ECHOCARDIOGRAM;  Surgeon: [name], MD;  Location: [place] [facility];  Service: Open Heart Surgery;  Laterality: N/A;   LAPAROSCOPY N/A 01/10/2015   Procedure: LAPAROSCOPY DIAGNOSTIC;  Surgeon: Greer Pickerel, MD;  Location: [facility];  Service: General;  Laterality: N/A;   ORIF HIP FRACTURE  2006   3 pins, right   RIGHT HEART CATH AND CORONARY ANGIOGRAPHY N/A 12/19/2021   Procedure: RIGHT HEART CATH AND CORONARY ANGIOGRAPHY;  Surgeon: Burnell Blanks, MD;  Location: [place] CV [facility];  Service: Cardiovascular;  Laterality: N/A;   TRANSCATHETER AORTIC VALVE REPLACEMENT, TRANSFEMORAL N/A 01/14/2022   Procedure: Transcatheter Aortic Valve Replacement, Transfemoral;  Surgeon: [name], MD;  Location: [place] [facility];  Service: Open Heart Surgery;  Laterality: N/A;    UMBILICAL HERNIA REPAIR N/A 08/18/2017   Procedure: LAPAROSCOPIC-ASSISTED UMBILICAL HERNIA  REPAIR ERAS PATHWAY;  Surgeon: Greer Pickerel, MD;  Location: [facility] OR;  Service: General;  Laterality: N/A;    Current Medications: Current Meds  Medication Sig   ACCU-CHEK GUIDE test strip 2 (two) times daily. as directed   acetaminophen (TYLENOL) 650 MG CR tablet Take 1,300 mg by mouth every 8 (eight) hours as needed for pain.   albuterol (VENTOLIN HFA) 108 (90 Base) MCG/ACT inhaler Inhale 2 puffs into the lungs every 6 (six) hours as needed for wheezing or shortness of breath.   amoxicillin (AMOXIL) 500 MG tablet Take 4 tablets (2,000 mg total) by mouth as directed. 1 hour prior to dental work including cleanings  aspirin 81 MG chewable tablet Chew 1 tablet (81 mg total) by mouth daily. Okay to swallow whole.   diphenhydrAMINE (BENADRYL) 25 MG tablet Take 25 mg by mouth at bedtime.   fexofenadine (ALLEGRA) 180 MG tablet Take 180 mg by mouth daily as needed for allergies or rhinitis.   losartan-hydrochlorothiazide (HYZAAR) 50-12.5 MG tablet Take 2 tablets by mouth daily.   pravastatin (PRAVACHOL) 40 MG tablet TAKE 1 TABLET(40 MG) BY MOUTH EVERY EVENING   SYMBICORT 80-4.5 MCG/ACT inhaler Take 2 puffs by mouth 2 (two) times daily.     Allergies:   Doxycycline hyclate, Ace inhibitors, Hydrocodone bit-homatrop mbr, Metoprolol, and Codeine   Social History   Socioeconomic History   Marital status: Married    Spouse name: Not on file   Number of children: 2   Years of education: Not on file   Highest education level: Not on file  Occupational History   Occupation: part-time office asst.  Tobacco Use   Smoking status: Former    Years: 45.00    Types: Cigarettes   Smokeless tobacco: Never   Tobacco comments:    maybe 5-10 cigarettes a day  starts and stops smoking every few days  Vaping Use   Vaping Use: Never used  Substance and Sexual Activity   Alcohol use: Yes    Alcohol/week: 0.0 - 1.0  standard drinks of alcohol    Comment: social   Drug use: No   Sexual activity: Not on file  Other Topics Concern   Not on file  Social History Narrative   Not on file   Social Determinants of Health   Financial Resource Strain: Not on file  Food Insecurity: No Food Insecurity (01/14/2022)   Hunger Vital Sign    Worried About Running Out of Food in the Last Year: Never true    Ran Out of Food in the Last Year: Never true  Transportation Needs: No Transportation Needs (01/14/2022)   PRAPARE - Hydrologist (Medical): No    Lack of Transportation (Non-Medical): No  Physical Activity: Not on file  Stress: Not on file  Social Connections: Not on file    Family History: The patient's family history includes COPD in her mother; Celiac disease in an other family member; Hypertension in her father. There is no history of Colon cancer, Esophageal cancer, Rectal cancer, or Stomach cancer.  ROS:   Please see the history of present illness.    All other systems reviewed and are negative.  EKGs/Labs/Other Studies Reviewed:    The following studies were reviewed today:  Echocardiogram 02/24/22:   1. Left ventricular ejection fraction, by estimation, is 55 to 60%. The  left ventricle has normal function. The left ventricle has no regional  wall motion abnormalities. There is moderate concentric left ventricular  hypertrophy. Left ventricular  diastolic parameters are consistent with Grade I diastolic dysfunction  (impaired relaxation).   2. Right ventricular systolic function is normal. The right ventricular  size is normal. Tricuspid regurgitation signal is inadequate for assessing  PA pressure.   3. Left atrial size was moderately dilated.   4. The mitral valve is degenerative. Trivial mitral valve regurgitation.  Mild mitral stenosis. The mean mitral valve gradient is 3.0 mmHg. Moderate  mitral annular calcification.   5. Bioprosthetic aortic valve s/p TAVR  with 23 mm Edwards Sapien THV.  Mean gradient 15 mmHg, mildly higher than prior, with EOA 2.5 cm^2. No  significant peri-valvular leakage noted. The aortic  valve has been  repaired/replaced.   6. The inferior vena cava is normal in size with greater than 50%  respiratory variability, suggesting right atrial pressure of 3 mmHg.   7. Bradycardia/sinus pauses noted on echo.   ZIO 02/05/22:    The dominant rhythm is sinus rhythm with first-degree AV block.  There is normal circadian variation.   There is no severe bradycardia, but there is a single 3-second pause due to sinus node dysfunction, with junctional escape.  There is no evidence of high-grade atrioventricular block.   Junctional escape beats are also seen following an episode of SVT.   There is a brief episode of supraventricular tachycardia (total of 14 seconds of consecutive brief bursts of paroxysmal atrial tachycardia).   There is a single 5 beat run of nonsustained ventricular tachycardia.   There are rare isolated PACs and PVCs.  There is no evidence of atrial fibrillation.    TAVR OPERATIVE NOTE     Date of Procedure:                01/14/2022   Preoperative Diagnosis:      Severe Aortic Stenosis    Postoperative Diagnosis:    Same    Procedure:        Transcatheter Aortic Valve Replacement - Percutaneous right Transfemoral Approach             Edwards Sapien 3 Ultra THV (size 23 mm, model # 9750RSL, serial # [id number])              Co-Surgeons:                        Coralie Common MD and Lenna Sciara, MD     Anesthesiologist:                  Laurie Panda MD   Echocardiographer:              [name] MD   Pre-operative Echo Findings: Severe aortic stenosis normal left ventricular systolic function   Post-operative Echo Findings: no paravalvular leak normal left ventricular systolic function   _____________     Echo 01/15/22: IMPRESSIONS   1. Left ventricular ejection fraction, by estimation, is >75%.  The left  ventricle has hyperdynamic function. The left ventricle has no regional  wall motion abnormalities. Left ventricular diastolic parameters were  normal.   2. Right ventricular systolic function is hyperdynamic. The right  ventricular size is normal. Tricuspid regurgitation signal is inadequate  for assessing PA pressure.   3. Left atrial size was mildly dilated.   4. The mitral valve is degenerative. No evidence of mitral valve  regurgitation. No evidence of mitral stenosis. Moderate to severe mitral  annular calcification.   5. The aortic valve has been repaired/replaced. Aortic valve  regurgitation is not visualized. Echo findings are consistent with normal  structure and function of the aortic valve prosthesis. Aortic valve mean  gradient measures 10.0 mmHg. Aortic valve  Vmax measures 2.20 m/s. Aortic valve acceleration time measures 81 ms   EKG:  EKG is ordered today.  The ekg ordered today demonstrates NSR with 1ste degree AV block.   Recent Labs: 01/10/2022: ALT 28 01/15/2022: BUN 20; Creatinine, Ser 0.91; Hemoglobin 10.1; Magnesium 1.8; Platelets 153; Potassium 4.0; Sodium 135  Recent Lipid Panel No results found for: "CHOL", "TRIG", "HDL", "CHOLHDL", "VLDL", "LDLCALC", "LDLDIRECT"   Physical Exam:    VS:  BP (!) 172/82  Pulse 67   Ht 5' 4"$  (1.626 m)   Wt 136 lb 12.8 oz (62.1 kg)   SpO2 96%   BMI 23.48 kg/m     Wt Readings from Last 3 Encounters:  02/24/22 136 lb 12.8 oz (62.1 kg)  01/30/22 136 lb 8 oz (61.9 kg)  01/22/22 138 lb 12.8 oz (63 kg)    General: Well developed, well nourished, NAD Neck: + L  carotid bruits. No JVD Lungs:Clear to ausculation bilaterally. No wheezes, rales, or rhonchi. Breathing is unlabored. Cardiovascular: RRR with S1 S2. No murmurs Extremities: No edema. Neuro: Alert and oriented. No focal deficits. No facial asymmetry. MAE spontaneously. Psych: Responds to questions appropriately with normal affect.    ASSESSMENT/PLAN:     Severe AS s/p TAVR: Patient doing well with NYHA class I symptoms s/p TAVR. Echo today with stable valve function with 23 mm Edwards Sapien THV and mean gradient 15 mmHg.  Continue on a ASA 53m monotherapy. Will need lifelong dental SBE prophylaxis with amoxicillin. Plan one year follow up with echocardiogram. Will need to follow with Dr. CSallyanne Kusterin 4-5 months.   Sinus brady: Zio AT showed no significant bradycardia or pausing. EKG with NSR and 1st degree AV block.  No dizziness or syncope.    IDA with history of GI bleeding: Followed closely by Dr. DLoletha Carrow Hopefully will tolerate a baby aspirin without further issues. Patient will call the clinic to schedule CBC this week.    HTN: Elevated today however reports home SBPs in the 130 range. May need additional antihypertensives. Pt plan to call the office to scheduled BMET this week.    COPD: Follows closely with pulmonary, PCP.    Hematuria: Referred to urology given history of smoking and hematuria while inpatient. Has scheduled appointment with Dr. PClaudia Desanctiscoming up.   Neck swelling: No evidence today however + for left carotid bruit. Denies recent illness with sore throat, ear pain. Denies trauma to the area. No tenderness now.  Will obtain Carotid dopplers and follow. Hx of tobacco use.     Medication Adjustments/Labs and Tests Ordered: Current medicines are reviewed at length with the patient today.  Concerns regarding medicines are outlined above.  Orders Placed This Encounter  Procedures   EKG 12-Lead   VAS [place]   No orders of the defined types were placed in this encounter.   Patient Instructions  Medication Instructions:  Your physician recommends that you continue on your current medications as directed. Please refer to the Current Medication list given to you today.   Lab Work: NONE If you have labs (blood work) drawn today and your tests are completely normal, you will receive your results only by: [place] (if you have MyChart) OR A paper copy in the mail If you have any lab test that is abnormal or we need to change your treatment, we will call you to review the results.   Testing/Procedures: YOUR PROVIDER RECOMMENDS THAT YOU GET A CAROTID ULTRASOUND DONE.   Follow-Up: [facility] and your health needs are our priority.  As part of our continuing mission to provide you with exceptional heart care, we have created designated Provider Care Teams.  These Care Teams include your primary Cardiologist (physician) and Advanced Practice Providers (APPs -  Physician Assistants and Nurse Practitioners) who all work together to provide you with the care you need, when you need it.  We recommend signing up for the patient portal called "MyChart".  Sign up  information is provided on this After Visit Summary.  MyChart is used to connect with patients for Virtual Visits (Telemedicine).  Patients are able to view lab/test results, encounter notes, upcoming appointments, etc.  Non-urgent messages can be sent to your provider as well.   To learn more about what you can do with MyChart, go to [email or web address].    Your next appointment:   APRIL 2024   Provider:   [name], MD    Signed, Kathyrn Drown, NP  02/25/2022 10:30 AM    [place]

## 2022-02-24 ENCOUNTER — Telehealth: Payer: Self-pay | Admitting: Cardiovascular Disease

## 2022-02-24 ENCOUNTER — Ambulatory Visit: Payer: BC Managed Care – PPO | Attending: Cardiology | Admitting: Cardiology

## 2022-02-24 ENCOUNTER — Other Ambulatory Visit (HOSPITAL_COMMUNITY): Payer: BC Managed Care – PPO

## 2022-02-24 ENCOUNTER — Ambulatory Visit (HOSPITAL_BASED_OUTPATIENT_CLINIC_OR_DEPARTMENT_OTHER): Payer: BC Managed Care – PPO

## 2022-02-24 VITALS — BP 172/82 | HR 67 | Ht 64.0 in | Wt 136.8 lb

## 2022-02-24 DIAGNOSIS — I350 Nonrheumatic aortic (valve) stenosis: Secondary | ICD-10-CM | POA: Insufficient documentation

## 2022-02-24 DIAGNOSIS — Z952 Presence of prosthetic heart valve: Secondary | ICD-10-CM

## 2022-02-24 DIAGNOSIS — R001 Bradycardia, unspecified: Secondary | ICD-10-CM | POA: Diagnosis not present

## 2022-02-24 DIAGNOSIS — Z720 Tobacco use: Secondary | ICD-10-CM | POA: Diagnosis not present

## 2022-02-24 DIAGNOSIS — R0989 Other specified symptoms and signs involving the circulatory and respiratory systems: Secondary | ICD-10-CM | POA: Diagnosis not present

## 2022-02-24 DIAGNOSIS — I10 Essential (primary) hypertension: Secondary | ICD-10-CM | POA: Diagnosis not present

## 2022-02-24 DIAGNOSIS — E119 Type 2 diabetes mellitus without complications: Secondary | ICD-10-CM | POA: Insufficient documentation

## 2022-02-24 DIAGNOSIS — D500 Iron deficiency anemia secondary to blood loss (chronic): Secondary | ICD-10-CM

## 2022-02-24 LAB — ECHOCARDIOGRAM COMPLETE
AR max vel: 2.15 cm2
AV Area VTI: 2.53 cm2
AV Area mean vel: 1.99 cm2
AV Mean grad: 15 mmHg
AV Peak grad: 21.7 mmHg
Ao pk vel: 2.33 m/s
Area-P 1/2: 2.49 cm2
S' Lateral: 2.3 cm

## 2022-02-24 NOTE — Telephone Encounter (Signed)
Patient was returning call. Please advise  

## 2022-02-24 NOTE — Patient Instructions (Addendum)
Medication Instructions:  Your physician recommends that you continue on your current medications as directed. Please refer to the Current Medication list given to you today.   Lab Work: NONE If you have labs (blood work) drawn today and your tests are completely normal, you will receive your results only by: [place] (if you have MyChart) OR A paper copy in the mail If you have any lab test that is abnormal or we need to change your treatment, we will call you to review the results.   Testing/Procedures: YOUR PROVIDER RECOMMENDS THAT YOU GET A CAROTID ULTRASOUND DONE.   Follow-Up: At [facility], you and your health needs are our priority.  As part of our continuing mission to provide you with exceptional heart care, we have created designated Provider Care Teams.  These Care Teams include your primary Cardiologist (physician) and Advanced Practice Providers (APPs -  Physician Assistants and Nurse Practitioners) who all work together to provide you with the care you need, when you need it.  We recommend signing up for the patient portal called "MyChart".  Sign up information is provided on this After Visit Summary.  MyChart is used to connect with patients for Virtual Visits (Telemedicine).  Patients are able to view lab/test results, encounter notes, upcoming appointments, etc.  Non-urgent messages can be sent to your provider as well.   To learn more about what you can do with MyChart, go to [email or web address].    Your next appointment:   APRIL 2024   Provider:   [name], MD

## 2022-02-25 ENCOUNTER — Other Ambulatory Visit: Payer: Self-pay

## 2022-02-25 ENCOUNTER — Encounter: Payer: BC Managed Care – PPO | Admitting: *Deleted

## 2022-02-25 DIAGNOSIS — D500 Iron deficiency anemia secondary to blood loss (chronic): Secondary | ICD-10-CM

## 2022-02-25 DIAGNOSIS — I10 Essential (primary) hypertension: Secondary | ICD-10-CM

## 2022-02-25 DIAGNOSIS — Z952 Presence of prosthetic heart valve: Secondary | ICD-10-CM

## 2022-02-25 NOTE — Telephone Encounter (Signed)
Pt advised to get blood work this week. Orders placed for [facility].

## 2022-02-25 NOTE — Telephone Encounter (Signed)
Patient asked if she needed to have any blood work due to swelling she had on the left side of her face/ear. The swelling lasted for a short while.

## 2022-02-25 NOTE — Progress Notes (Signed)
Daily Session Note  Patient Details  Name: Mandy Delacruz MRN: [id number] Date of Birth: January 11, 1946 Referring Provider:   Flowsheet Row Cardiac Rehab from 01/30/2022 in [facility] Cardiac and Pulmonary Rehab  Referring Provider [name] MD       Encounter Date: 02/25/2022  Check In:  Session Check In - 02/25/22 1118       Check-In   Supervising physician immediately available to respond to emergencies See telemetry face sheet for immediately available ER MD    Location [facility]-Cardiac & Pulmonary Rehab    Staff Present Renita Papa, RN BSN;Noah Tickle, BS, Exercise Physiologist;Jessica Yznaga, MA, RCEP, CCRP, CCET    Virtual Visit No    Medication changes reported     No    Fall or balance concerns reported    No    Warm-up and Cool-down Performed on first and last piece of equipment    Resistance Training Performed Yes    VAD Patient? No    PAD/SET Patient? No      Pain Assessment   Currently in Pain? No/denies                Social History   Tobacco Use  Smoking Status Former   Years: 45.00   Types: Cigarettes  Smokeless Tobacco Never  Tobacco Comments   maybe 5-10 cigarettes a day  starts and stops smoking every few days    Goals Met:  Independence with exercise equipment Exercise tolerated well No report of concerns or symptoms today Strength training completed today  Goals Unmet:  Not Applicable  Comments: Pt able to follow exercise prescription today without complaint.  Will continue to monitor for progression.    Dr. Emily Filbert is Medical Director for [facility].  Dr. Ottie Glazier is Medical Director for [facility] Pulmonary Rehabilitation.

## 2022-02-27 ENCOUNTER — Encounter: Payer: BC Managed Care – PPO | Admitting: *Deleted

## 2022-02-27 DIAGNOSIS — Z952 Presence of prosthetic heart valve: Secondary | ICD-10-CM | POA: Diagnosis not present

## 2022-02-27 NOTE — Progress Notes (Signed)
Daily Session Note  Patient Details  Name: Mandy Delacruz MRN: [id number] Date of Birth: [birth year] Referring Provider:   Flowsheet Row Cardiac Rehab from 01/30/2022 in [facility] Cardiac and Pulmonary Rehab  Referring Provider [name] MD       Encounter Date: 02/27/2022  Check In:  Session Check In - 02/27/22 1121       Check-In   Supervising physician immediately available to respond to emergencies See telemetry face sheet for immediately available ER MD    Location [facility]-Cardiac & Pulmonary Rehab    Staff Present Darlyne Russian, RN, ADN;Jessica Luan Pulling, MA, RCEP, CCRP, CCET;Joseph [place], RCP,RRT,BSRT;Noah [place], [place], Exercise Physiologist    Virtual Visit No    Medication changes reported     No    Fall or balance concerns reported    No    Warm-up and Cool-down Performed on first and last piece of equipment    Resistance Training Performed Yes    VAD Patient? No    PAD/SET Patient? No      Pain Assessment   Currently in Pain? No/denies                Social History   Tobacco Use  Smoking Status Former   Years: 45.00   Types: Cigarettes  Smokeless Tobacco Never  Tobacco Comments   maybe 5-10 cigarettes a day  starts and stops smoking every few days    Goals Met:  Independence with exercise equipment Exercise tolerated well No report of concerns or symptoms today Strength training completed today  Goals Unmet:  Not Applicable  Comments: Pt able to follow exercise prescription today without complaint.  Will continue to monitor for progression.    Dr. Emily Filbert is Medical Director for [facility].  Dr. Ottie Glazier is Medical Director for [facility] Pulmonary Rehabilitation.

## 2022-02-28 ENCOUNTER — Encounter: Payer: BC Managed Care – PPO | Admitting: *Deleted

## 2022-02-28 DIAGNOSIS — Z952 Presence of prosthetic heart valve: Secondary | ICD-10-CM | POA: Diagnosis not present

## 2022-02-28 DIAGNOSIS — D500 Iron deficiency anemia secondary to blood loss (chronic): Secondary | ICD-10-CM | POA: Diagnosis not present

## 2022-02-28 DIAGNOSIS — I10 Essential (primary) hypertension: Secondary | ICD-10-CM | POA: Diagnosis not present

## 2022-02-28 LAB — CBC

## 2022-02-28 NOTE — Progress Notes (Signed)
Daily Session Note  Patient Details  Name: Mandy Delacruz MRN: [id number] Date of Birth: [birth year] Referring Provider:   Flowsheet Row Cardiac Rehab from 01/30/2022 in [facility] Cardiac and Pulmonary Rehab  Referring Provider [name] MD       Encounter Date: 02/28/2022  Check In:  Session Check In - 02/28/22 1113       Check-In   Supervising physician immediately available to respond to emergencies See telemetry face sheet for immediately available ER MD    Location [facility]-Cardiac & Pulmonary Rehab    Staff Present Darlyne Russian, RN, ADN;Jessica Luan [place]ing, MA, RCEP, CCRP, CCET;Joseph [place], [place]    Virtual Visit No    Medication changes reported     No    Fall or balance concerns reported    No    Warm-up and Cool-down Performed on first and last piece of equipment    Resistance Training Performed Yes    VAD Patient? No    PAD/SET Patient? No      Pain Assessment   Currently in Pain? No/denies                Social History   Tobacco Use  Smoking Status Former   Years: 45.00   Types: Cigarettes  Smokeless Tobacco Never  Tobacco Comments   maybe 5-10 cigarettes a day  starts and stops smoking every few days    Goals Met:  Independence with exercise equipment Exercise tolerated well No report of concerns or symptoms today Strength training completed today  Goals Unmet:  Not Applicable  Comments: Pt able to follow exercise prescription today without complaint.  Will continue to monitor for progression.    Dr. Emily Filbert is Medical Director for [facility].  Dr. Ottie Glazier is Medical Director for [facility] Pulmonary Rehabilitation.

## 2022-03-01 LAB — BASIC METABOLIC PANEL
BUN/Creatinine Ratio: 25 (ref 12–28)
BUN: 30 mg/dL — ABNORMAL HIGH (ref 8–27)
CO2: 22 mmol/L (ref 20–29)
Calcium: 10.2 mg/dL (ref 8.7–10.3)
Chloride: 98 mmol/L (ref 96–106)
Creatinine, Ser: 1.2 mg/dL — ABNORMAL HIGH (ref 0.57–1.00)
Glucose: 106 mg/dL — ABNORMAL HIGH (ref 70–99)
Potassium: 4.6 mmol/L (ref 3.5–5.2)
Sodium: 134 mmol/L (ref 134–144)
eGFR: 47 mL/min/{1.73_m2} — ABNORMAL LOW (ref >59)

## 2022-03-01 LAB — CBC
Hematocrit: 33.3 % — ABNORMAL LOW (ref 34.0–46.6)
Hemoglobin: 10.9 g/dL — ABNORMAL LOW (ref 11.1–15.9)
MCH: 30.3 pg (ref 26.6–33.0)
MCHC: 32.7 g/dL (ref 31.5–35.7)
MCV: 93 fL (ref 79–97)
Platelets: 210 10*3/uL (ref 150–450)
RBC: 3.6 x10E6/uL — ABNORMAL LOW (ref 3.77–5.28)
RDW: 12.9 % (ref 11.7–15.4)
WBC: 7.1 10*3/uL (ref 3.4–10.8)

## 2022-03-04 ENCOUNTER — Encounter: Payer: BC Managed Care – PPO | Admitting: *Deleted

## 2022-03-04 DIAGNOSIS — Z952 Presence of prosthetic heart valve: Secondary | ICD-10-CM

## 2022-03-04 NOTE — Progress Notes (Signed)
Daily Session Note  Patient Details  Name: Mandy Delacruz MRN: [id number] Date of Birth: 1945/06/30 Referring Provider:   Flowsheet Row Cardiac Rehab from 01/30/2022 in [facility]  Referring Provider [name] MD       Encounter Date: 03/04/2022  Check In:  Session Check In - 03/04/22 1144       Check-In   Supervising physician immediately available to respond to emergencies See telemetry face sheet for immediately available ER MD    Location [facility]-Cardiac & Pulmonary Rehab    Staff Present Nyoka Cowden, RN, BSN, Fenton Foy, BS, Exercise Physiologist;Tequlia Gonsalves [place], MA, RCEP, CCRP, CCET    Virtual Visit No    Medication changes reported     No    Fall or balance concerns reported    No    Tobacco Cessation No Change    Warm-up and Cool-down Performed on first and last piece of equipment    Resistance Training Performed Yes    VAD Patient? No    PAD/SET Patient? No      Pain Assessment   Currently in Pain? No/denies    Multiple Pain Sites No                Social History   Tobacco Use  Smoking Status Former   Years: 45.00   Types: Cigarettes  Smokeless Tobacco Never  Tobacco Comments   maybe 5-10 cigarettes a day  starts and stops smoking every few days    Goals Met:  Independence with exercise equipment Exercise tolerated well No report of concerns or symptoms today  Goals Unmet:  Not Applicable  Comments: Pt able to follow exercise prescription today without complaint.  Will continue to monitor for progression.    Dr. Emily Filbert is Medical Director for [place].  Dr. Ottie Glazier is Medical Director for [facility].

## 2022-03-05 ENCOUNTER — Encounter: Payer: Self-pay | Admitting: *Deleted

## 2022-03-05 ENCOUNTER — Ambulatory Visit (HOSPITAL_COMMUNITY)
Admission: RE | Admit: 2022-03-05 | Discharge: 2022-03-05 | Disposition: A | Discharge status: Home / Self Care | Payer: BC Managed Care – PPO | Source: Ambulatory Visit | Attending: Cardiology | Admitting: Cardiology

## 2022-03-05 DIAGNOSIS — R0989 Other specified symptoms and signs involving the circulatory and respiratory systems: Secondary | ICD-10-CM | POA: Diagnosis not present

## 2022-03-05 DIAGNOSIS — Z952 Presence of prosthetic heart valve: Secondary | ICD-10-CM

## 2022-03-05 NOTE — Progress Notes (Signed)
Cardiac Individual Treatment Plan  Patient Details  Name: VYVIAN VITELLO MRN: [id number] Date of Birth: 04/09/1945 Referring Provider:   Flowsheet Row Cardiac Rehab from 01/30/2022 in [facility] Cardiac and Pulmonary Rehab  Referring Provider [name] MD       Initial Encounter Date:  Flowsheet Row Cardiac Rehab from 01/30/2022 in [facility] Cardiac and Pulmonary Rehab  Date 01/30/22       Visit Diagnosis: S/P TAVR (transcatheter aortic valve replacement)  Patient's Home Medications on Admission:  Current Outpatient Medications:    ACCU-CHEK GUIDE test strip, 2 (two) times daily. as directed, Disp: , Rfl:    acetaminophen (TYLENOL) 650 MG CR tablet, Take 1,300 mg by mouth every 8 (eight) hours as needed for pain., Disp: , Rfl:    albuterol (VENTOLIN HFA) 108 (90 Base) MCG/ACT inhaler, Inhale 2 puffs into the lungs every 6 (six) hours as needed for wheezing or shortness of breath., Disp: , Rfl:    amoxicillin (AMOXIL) 500 MG tablet, Take 4 tablets (2,000 mg total) by mouth as directed. 1 hour prior to dental work including cleanings, Disp: 12 tablet, Rfl: 12   aspirin 81 MG chewable tablet, Chew 1 tablet (81 mg total) by mouth daily. Okay to swallow whole., Disp: , Rfl:    diphenhydrAMINE (BENADRYL) 25 MG tablet, Take 25 mg by mouth at bedtime., Disp: , Rfl:    ferrous sulfate 325 (65 FE) MG tablet, Take 1 tablet (325 mg total) by mouth daily., Disp: 30 tablet, Rfl: 3   fexofenadine (ALLEGRA) 180 MG tablet, Take 180 mg by mouth daily as needed for allergies or rhinitis., Disp: , Rfl:    losartan-hydrochlorothiazide (HYZAAR) 50-12.5 MG tablet, Take 1 tablet by mouth daily. (Patient not taking: Reported on 02/24/2022), Disp: 90 tablet, Rfl: 3   losartan-hydrochlorothiazide (HYZAAR) 50-12.5 MG tablet, Take 2 tablets by mouth daily., Disp: , Rfl:    pantoprazole (PROTONIX) 40 MG tablet, Take 1 tablet (40 mg total) by mouth daily. (Patient taking differently: Take 40 mg by mouth at bedtime.),  Disp: 30 tablet, Rfl: 2   pravastatin (PRAVACHOL) 40 MG tablet, TAKE 1 TABLET(40 MG) BY MOUTH EVERY EVENING, Disp: 90 tablet, Rfl: 0   SYMBICORT 80-4.5 MCG/ACT inhaler, Take 2 puffs by mouth 2 (two) times daily., Disp: , Rfl: 11  Past Medical History: Past Medical History:  Diagnosis Date   Anemia    Asthma    Bronchitis    on bactrin since 12-27-14   Colon polyps 2001   Diverticulosis of colon (without mention of hemorrhage) 2001   Dizziness    inner ear problem at times, saw dr  [name] for   GERD (gastroesophageal reflux disease)    GI bleed    Hiatal hernia 2001   Hyperlipidemia    Hypertension    IH (inguinal hernia)    left   Lung nodule    Other esophagitis 2001   Severe aortic stenosis    Tobacco abuse     Tobacco Use: Social History   Tobacco Use  Smoking Status Former   Years: 45.00   Types: Cigarettes  Smokeless Tobacco Never  Tobacco Comments   maybe 5-10 cigarettes a day  starts and stops smoking every few days    Labs: Review Flowsheet       Latest Ref Rng & Units 10/25/2021 12/19/2021 01/14/2022  Labs for ITP Cardiac and Pulmonary Rehab  Hemoglobin A1c 4.8 - 5.6 % 6.0  - -  PH, Arterial 7.35 - 7.45 - 7.391  -  PCO2 arterial 32 - 48 mmHg - 34.7  -  Bicarbonate 20.0 - 28.0 mmol/L - 25.0  21.1  -  TCO2 22 - 32 mmol/L - 26  22  22  24   $ Acid-base deficit 0.0 - 2.0 mmol/L - 1.0  3.0  -  O2 Saturation % - 67  94  -     Exercise Target Goals: Exercise Program Goal: Individual exercise prescription set using results from initial 6 min walk test and THRR while considering  patient's activity barriers and safety.   Exercise Prescription Goal: Initial exercise prescription builds to 30-45 minutes a day of aerobic activity, 2-3 days per week.  Home exercise guidelines will be given to patient during program as part of exercise prescription that the participant will acknowledge.   Education: Aerobic Exercise: - Group verbal and visual  presentation on the components of exercise prescription. Introduces F.I.T.T principle from ACSM for exercise prescriptions.  Reviews F.I.T.T. principles of aerobic exercise including progression. Written material given at graduation.   Education: Resistance Exercise: - Group verbal and visual presentation on the components of exercise prescription. Introduces F.I.T.T principle from ACSM for exercise prescriptions  Reviews F.I.T.T. principles of resistance exercise including progression. Written material given at graduation.    Education: Exercise & Equipment Safety: - Individual verbal instruction and demonstration of equipment use and safety with use of the equipment. Flowsheet Row Cardiac Rehab from 02/27/2022 in [facility] Cardiac and Pulmonary Rehab  Date 01/30/22  Educator NT  Instruction Review Code 1- Verbalizes Understanding       Education: Exercise Physiology & General Exercise Guidelines: - Group verbal and written instruction with models to review the exercise physiology of the cardiovascular system and associated critical values. Provides general exercise guidelines with specific guidelines to those with heart or lung disease.    Education: Flexibility, Balance, Mind/Body Relaxation: - Group verbal and visual presentation with interactive activity on the components of exercise prescription. Introduces F.I.T.T principle from ACSM for exercise prescriptions. Reviews F.I.T.T. principles of flexibility and balance exercise training including progression. Also discusses the mind body connection.  Reviews various relaxation techniques to help reduce and manage stress (i.e. Deep breathing, progressive muscle relaxation, and visualization). Balance handout provided to take home. Written material given at graduation. Flowsheet Row Cardiac Rehab from 02/27/2022 in [facility] Cardiac and Pulmonary Rehab  Date 02/13/22  Educator [facility]  Instruction Review Code 1- Verbalizes Understanding       Activity  Barriers & Risk Stratification:  Activity Barriers & Cardiac Risk Stratification - 01/30/22 1333       Activity Barriers & Cardiac Risk Stratification   Activity Barriers Arthritis;Back Problems;Joint Problems;Muscular Weakness   left foot numbness   Cardiac Risk Stratification Low             6 Minute Walk:  6 Minute Walk     Row Name 01/30/22 1332         6 Minute Walk   Phase Initial     Distance 1210 feet     Walk Time 6 minutes     # of Rest Breaks 0     MPH 2.29     METS 2.97     RPE 12     Perceived Dyspnea  0     VO2 Peak 10.39     Symptoms Yes (comment)     Comments Calf tightness     Resting HR 63 bpm     Resting BP 140/66     Resting Oxygen  Saturation  99 %     Exercise Oxygen Saturation  during 6 min walk 98 %     Max Ex. HR 104 bpm     Max Ex. BP 168/74     2 Minute Post BP 150/70              Oxygen Initial Assessment:   Oxygen Re-Evaluation:   Oxygen Discharge (Final Oxygen Re-Evaluation):   Initial Exercise Prescription:  Initial Exercise Prescription - 01/30/22 1300       Date of Initial Exercise RX and Referring Provider   Date 01/30/22    Referring Provider Croitoru, Mihai MD      Oxygen   Maintain Oxygen Saturation 88% or higher      Treadmill   MPH 2.1    Grade 1    Minutes 15    METs 2.9      Recumbant Bike   Level 2    RPM 50    Watts 19    Minutes 15    METs 2.97      NuStep   Level 2    SPM 80    Minutes 15    METs 2.97      Prescription Details   Frequency (times per week) 3    Duration Progress to 30 minutes of continuous aerobic without signs/symptoms of physical distress      Intensity   THRR 40-80% of Max Heartrate 95-128    Ratings of Perceived Exertion 11-13    Perceived Dyspnea 0-4      Progression   Progression Continue to progress workloads to maintain intensity without signs/symptoms of physical distress.      Resistance Training   Training Prescription Yes    Weight 3 lb    Reps  10-15             Perform Capillary Blood Glucose checks as needed.  Exercise Prescription Changes:   Exercise Prescription Changes     Row Name 01/30/22 1300 02/18/22 1100 02/27/22 0800         Response to Exercise   Blood Pressure (Admit) 140/66 -- 122/64     Blood Pressure (Exercise) 168/74 -- 124/60     Blood Pressure (Exit) 150/70 -- 124/62     Heart Rate (Admit) 63 bpm -- 57 bpm     Heart Rate (Exercise) 104 bpm -- 106 bpm     Heart Rate (Exit) 76 bpm -- 88 bpm     Oxygen Saturation (Admit) 99 % -- --     Oxygen Saturation (Exercise) 98 % -- --     Oxygen Saturation (Exit) 98 % -- --     Rating of Perceived Exertion (Exercise) 12 -- 13     Perceived Dyspnea (Exercise) 0 -- --     Symptoms Calf tightness -- none     Comments 6MWT Results -- 2nd full week of exercise     Duration -- -- Continue with 30 min of aerobic exercise without signs/symptoms of physical distress.     Intensity -- -- THRR unchanged       Progression   Progression -- -- Continue to progress workloads to maintain intensity without signs/symptoms of physical distress.     Average METs -- -- 2.67       Resistance Training   Training Prescription -- -- Yes     Weight -- -- 3 lb     Reps -- -- 10-15       Interval Training  Interval Training -- -- No       Treadmill   MPH -- -- 2.1     Grade -- -- 1     Minutes -- -- 15     METs -- -- 2.9       Recumbant Bike   Level -- -- 1     Watts -- -- 19     Minutes -- -- 15     METs -- -- 2.98       NuStep   Level -- -- 4     Minutes -- -- 15       Home Exercise Plan   Plans to continue exercise at -- Home (comment)  walking, [facility] (comment)  walking, [facility]     Frequency -- Add 2 additional days to program exercise sessions. Add 2 additional days to program exercise sessions.     Initial Home Exercises Provided -- 02/18/22 02/18/22       Oxygen   Maintain Oxygen Saturation -- -- 88% or higher               Exercise Comments:   Exercise Comments     Row Name 02/13/22 1120           Exercise Comments First full day of exercise!  Patient was oriented to gym and equipment including functions, settings, policies, and procedures.  Patient's individual exercise prescription and treatment plan were reviewed.  All starting workloads were established based on the results of the 6 minute walk test done at initial orientation visit.  The plan for exercise progression was also introduced and progression will be customized based on patient's performance and goals.                Exercise Goals and Review:   Exercise Goals     Row Name 01/30/22 1307             Exercise Goals   Increase Physical Activity Yes       Intervention Provide advice, education, support and counseling about physical activity/exercise needs.;Develop an individualized exercise prescription for aerobic and resistive training based on initial evaluation findings, risk stratification, comorbidities and participant's personal goals.       Expected Outcomes Short Term: Attend rehab on a regular basis to increase amount of physical activity.;Long Term: Add in home exercise to make exercise part of routine and to increase amount of physical activity.;Long Term: Exercising regularly at least 3-5 days a week.       Increase Strength and Stamina Yes       Intervention Provide advice, education, support and counseling about physical activity/exercise needs.;Develop an individualized exercise prescription for aerobic and resistive training based on initial evaluation findings, risk stratification, comorbidities and participant's personal goals.       Expected Outcomes Short Term: Increase workloads from initial exercise prescription for resistance, speed, and METs.;Short Term: Perform resistance training exercises routinely during rehab and add in resistance training at home;Long Term: Improve cardiorespiratory fitness, muscular  endurance and strength as measured by increased METs and functional capacity (6MWT)       Able to understand and use rate of perceived exertion (RPE) scale Yes       Intervention Provide education and explanation on how to use RPE scale       Expected Outcomes Short Term: Able to use RPE daily in rehab to express subjective intensity level;Long Term:  Able to use RPE to guide intensity level  when exercising independently       Able to understand and use Dyspnea scale Yes       Intervention Provide education and explanation on how to use Dyspnea scale       Expected Outcomes Short Term: Able to use Dyspnea scale daily in rehab to express subjective sense of shortness of breath during exertion;Long Term: Able to use Dyspnea scale to guide intensity level when exercising independently       Knowledge and understanding of Target Heart Rate Range (THRR) Yes       Intervention Provide education and explanation of THRR including how the numbers were predicted and where they are located for reference       Expected Outcomes Short Term: Able to state/look up THRR;Long Term: Able to use THRR to govern intensity when exercising independently;Short Term: Able to use daily as guideline for intensity in rehab       Able to check pulse independently Yes       Intervention Provide education and demonstration on how to check pulse in carotid and radial arteries.;Review the importance of being able to check your own pulse for safety during independent exercise       Expected Outcomes Short Term: Able to explain why pulse checking is important during independent exercise;Long Term: Able to check pulse independently and accurately       Understanding of Exercise Prescription Yes       Intervention Provide education, explanation, and written materials on patient's individual exercise prescription       Expected Outcomes Short Term: Able to explain program exercise prescription;Long Term: Able to explain home exercise  prescription to exercise independently                Exercise Goals Re-Evaluation :  Exercise Goals Re-Evaluation     Row Name 02/13/22 1120 02/18/22 1126 02/27/22 0826         Exercise Goal Re-Evaluation   Exercise Goals Review Able to understand and use rate of perceived exertion (RPE) scale;Able to understand and use Dyspnea scale;Knowledge and understanding of Target Heart Rate Range (THRR);Understanding of Exercise Prescription Able to understand and use rate of perceived exertion (RPE) scale;Able to understand and use Dyspnea scale;Knowledge and understanding of Target Heart Rate Range (THRR);Understanding of Exercise Prescription;Increase Strength and Stamina;Increase Physical Activity Increase Physical Activity;Increase Strength and Stamina;Understanding of Exercise Prescription     Comments Reviewed RPE scale, THR and program prescription with pt today.  Pt voiced understanding and was given a copy of goals to take home. Reviewed home exercise with pt today.  Pt plans to walking and go to [facility]) for exercise.  Reviewed THR, pulse, RPE, sign and symptoms, pulse oximetery and when to call 911 or MD.  Also discussed weather considerations and indoor options.  Pt voiced understanding.    She has noted that she has had some numbness and pain with walking.  She has another follow up on Monday for her murmur and legs.  Pam is also wanting to work on her balance some. Pam is doing well for her first couple of weeks in rehab. She has already been able to increase her T4 Nustep to level 4. Other than that, she has been following her inital exercise prescription well. RPEs are staying in appropriate range and she hits her THR most sessions. Will continue to monitor her progression.     Expected Outcomes Short: Use RPE daily to regulate intensity. Long: Follow program prescription in THR.  Short: Talk to doctor about legs and start to add in exercise Long; continue to improve stamina  Short: Start to increase treadmill workload, speed Long: Increase overall stamina and strength              Discharge Exercise Prescription (Final Exercise Prescription Changes):  Exercise Prescription Changes - 02/27/22 0800       Response to Exercise   Blood Pressure (Admit) 122/64    Blood Pressure (Exercise) 124/60    Blood Pressure (Exit) 124/62    Heart Rate (Admit) 57 bpm    Heart Rate (Exercise) 106 bpm    Heart Rate (Exit) 88 bpm    Rating of Perceived Exertion (Exercise) 13    Symptoms none    Comments 2nd full week of exercise    Duration Continue with 30 min of aerobic exercise without signs/symptoms of physical distress.    Intensity THRR unchanged      Progression   Progression Continue to progress workloads to maintain intensity without signs/symptoms of physical distress.    Average METs 2.67      Resistance Training   Training Prescription Yes    Weight 3 lb    Reps 10-15      Interval Training   Interval Training No      Treadmill   MPH 2.1    Grade 1    Minutes 15    METs 2.9      Recumbant Bike   Level 1    Watts 19    Minutes 15    METs 2.98      NuStep   Level 4    Minutes 15      Home Exercise Plan   Plans to continue exercise at Home (comment)   walking, [facility]   Frequency Add 2 additional days to program exercise sessions.    Initial Home Exercises Provided 02/18/22      Oxygen   Maintain Oxygen Saturation 88% or higher             Nutrition:  Target Goals: Understanding of nutrition guidelines, daily intake of sodium <1544m, cholesterol <2032m calories 30% from fat and 7% or less from saturated fats, daily to have 5 or more servings of fruits and vegetables.  Education: All About Nutrition: -Group instruction provided by verbal, written material, interactive activities, discussions, models, and posters to present general guidelines for heart healthy nutrition including fat, fiber, MyPlate, the role of sodium  in heart healthy nutrition, utilization of the nutrition label, and utilization of this knowledge for meal planning. Follow up email sent as well. Written material given at graduation. [facility]  Education need identified 01/30/22       Biometrics:  Pre Biometrics - 01/30/22 1334       Pre Biometrics   Height 5' 4.25" (1.632 m)    Weight 136 lb 8 oz (61.9 kg)    Waist Circumference 37 inches    Hip Circumference 37.5 inches    Waist to Hip Ratio 0.99 %    BMI (Calculated) 23.25    Single Leg Stand 11.8 seconds   L             Nutrition Therapy Plan and Nutrition Goals:  Nutrition Therapy & Goals - 02/18/22 1131       Nutrition Therapy   RD appointment deferred Yes             Nutrition  Assessments:  MEDIFICTS Score Key: ?70 Need to make dietary changes  40-70 Heart Healthy Diet ? 40 Therapeutic Level Cholesterol Diet  Flowsheet Row Cardiac Rehab from 01/30/2022 in [facility] Cardiac and Pulmonary Rehab  Picture Your Plate Total Score on Admission 55      Picture Your Plate Scores: [id number] Unhealthy dietary pattern with much room for improvement. 41-50 Dietary pattern unlikely to meet recommendations for good health and room for improvement. 51-60 More healthful dietary pattern, with some room for improvement.  >60 Healthy dietary pattern, although there may be some specific behaviors that could be improved.    Nutrition Goals Re-Evaluation:  Nutrition Goals Re-Evaluation     Orrick Name 02/18/22 1131             Goals   Nutrition Goal Defer Nutrition Appt  Work on weight loss       Comment Pam feel sthat she does not need to meet with dietitian as she feels like she knows what she is supposed to eat. She is trying to eat lots of fruits and vegetables.  She is sticking to the lean proteins.  She had beef stew last night for dinner, that she made herself.  Her husband loves beans so they get a  lot of those in too.  She enjoys cooking at home.  She has always used salt, but is now trying to swtich over to more pepper as seasoning to reduce salt.  She is becoming more aware of it now.       Expected Outcome Short: Continue to cook at home and watch sodium. Long: Conitnue to reduce sodium                Nutrition Goals Discharge (Final Nutrition Goals Re-Evaluation):  Nutrition Goals Re-Evaluation - 02/18/22 1131       Goals   Nutrition Goal Defer Nutrition Appt  Work on weight loss    Comment Pam feel sthat she does not need to meet with dietitian as she feels like she knows what she is supposed to eat. She is trying to eat lots of fruits and vegetables.  She is sticking to the lean proteins.  She had beef stew last night for dinner, that she made herself.  Her husband loves beans so they get a lot of those in too.  She enjoys cooking at home.  She has always used salt, but is now trying to swtich over to more pepper as seasoning to reduce salt.  She is becoming more aware of it now.    Expected Outcome Short: Continue to cook at home and watch sodium. Long: Conitnue to reduce sodium             Psychosocial: Target Goals: Acknowledge presence or absence of significant depression and/or stress, maximize coping skills, provide positive support system. Participant is able to verbalize types and ability to use techniques and skills needed for reducing stress and depression.   Education: Stress, Anxiety, and Depression - Group verbal and visual presentation to define topics covered.  Reviews how body is impacted by stress, anxiety, and depression.  Also discusses healthy ways to reduce stress and to treat/manage anxiety and depression.  Written material given at graduation.   Education: Sleep Hygiene -Provides group verbal and written instruction about how sleep can affect your health.  Define sleep hygiene, discuss sleep cycles and impact of sleep habits. Review good sleep  hygiene tips.    Initial Review & Psychosocial Screening:  Initial Psych Review &  Screening - 01/29/22 1016       Initial Review   Current issues with None Identified      Family Dynamics   Good Support System? Yes   husband, children near by     Barriers   Psychosocial barriers to participate in program There are no identifiable barriers or psychosocial needs.      Screening Interventions   Interventions Encouraged to exercise;To provide support and resources with identified psychosocial needs;Provide feedback about the scores to participant    Expected Outcomes Short Term goal: Utilizing psychosocial counselor, staff and physician to assist with identification of specific Stressors or current issues interfering with healing process. Setting desired goal for each stressor or current issue identified.;Long Term Goal: Stressors or current issues are controlled or eliminated.;Short Term goal: Identification and review with participant of any Quality of Life or Depression concerns found by scoring the questionnaire.;Long Term goal: The participant improves quality of Life and PHQ9 Scores as seen by post scores and/or verbalization of changes             Quality of Life Scores:   Quality of Life - 01/30/22 1057       Quality of Life   Select Quality of Life      Quality of Life Scores   Health/Function Pre 20 %    Socioeconomic Pre 25.63 %    Psych/Spiritual Pre 23.93 %    Family Pre 30 %    GLOBAL Pre 23.5 %            Scores of 19 and below usually indicate a poorer quality of life in these areas.  A difference of  2-3 points is a clinically meaningful difference.  A difference of 2-3 points in the total score of the Quality of Life Index has been associated with significant improvement in overall quality of life, self-image, physical symptoms, and general health in studies assessing change in quality of life.  PHQ-9: Review Flowsheet       01/30/2022 11/17/2017   Depression screen PHQ 2/9  Decreased Interest 0 0  Down, Depressed, Hopeless 0 0  PHQ - 2 Score 0 0  Altered sleeping 1 -  Tired, decreased energy 1 -  Change in appetite 0 -  Feeling bad or failure about yourself  0 -  Trouble concentrating 0 -  Moving slowly or fidgety/restless 0 -  Suicidal thoughts 0 -  PHQ-9 Score 2 -  Difficult doing work/chores Not difficult at all -   Interpretation of Total Score  Total Score Depression Severity:  1-4 = Minimal depression, 5-9 = Mild depression, 10-14 = Moderate depression, 15-19 = Moderately severe depression, 20-27 = Severe depression   Psychosocial Evaluation and Intervention:  Psychosocial Evaluation - 01/29/22 1029       Psychosocial Evaluation & Interventions   Interventions Encouraged to exercise with the program and follow exercise prescription    Comments PAm has no barriers to attending the program. She is looking forward to returning to an exercise routine. 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  She stated she needs to build herself back up with her exercise. She lives with her husband and they have a 100 lb Lab. Her husband and children are her support.  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   She does have TYpe 2 Diabetes and at this time is diet controlled. She lost some weight in attempt  to help control her Diabetes. She should do well with the program    Expected Outcomes STG Pam attends all scheduled sessions. she is able to progress with her exercise prescription and get back to more her exercise routine. LTG Pam continues to progress with her exercise routine.    Continue Psychosocial Services  Follow up required by staff             Psychosocial Re-Evaluation:  Psychosocial Re-Evaluation     [place] Name 02/18/22 1127             Psychosocial Re-Evaluation   Current issues with Current Stress Concerns       Comments Pam is off to a  good start in rehab. She has noted some pain in legs with walking at home and in her shoulder. She does see her doctor later this week and plans to talk to them about it.  She denies any other major stressors currently. She is enjoying getting back to an exercise routine again. She is also wanting to to work on her balance. She is a good sleeper and gets between 8-9 hrs.  She says she needs to be more active after dinner so her food doesn't sit and she moves more versus napping in chair.       Expected Outcomes Short: Start to move more and talk to doctor about pain Long: conitnue to stay positive       Interventions Encouraged to attend Cardiac Rehabilitation for the exercise;Stress management education       Continue Psychosocial Services  Follow up required by staff                Psychosocial Discharge (Final Psychosocial Re-Evaluation):  Psychosocial Re-Evaluation - 02/18/22 1127       Psychosocial Re-Evaluation   Current issues with Current Stress Concerns    Comments Pam is off to a good start in rehab. She has noted some pain in legs with walking at home and in her shoulder. She does see her doctor later this week and plans to talk to them about it.  She denies any other major stressors currently. She is enjoying getting back to an exercise routine again. She is also wanting to to work on her balance. She is a good sleeper and gets between 8-9 hrs.  She says she needs to be more active after dinner so her food doesn't sit and she moves more versus napping in chair.    Expected Outcomes Short: Start to move more and talk to doctor about pain Long: conitnue to stay positive    Interventions Encouraged to attend Cardiac Rehabilitation for the exercise;Stress management education    Continue Psychosocial Services  Follow up required by staff             Vocational Rehabilitation: Provide vocational rehab assistance to qualifying candidates.   Vocational Rehab Evaluation &  Intervention:  Vocational Rehab - 01/29/22 1024       Initial Vocational Rehab Evaluation & Intervention   Assessment shows need for Vocational Rehabilitation No      Vocational Rehab Re-Evaulation   Comments retired             Education: Education Goals: Education classes will be provided on a variety of topics geared toward better understanding of heart health and risk factor modification. Participant will state understanding/return demonstration of topics presented as noted by education test scores.  Learning Barriers/Preferences:   General Cardiac Education Topics:  AED/CPR: - Group  verbal and written instruction with the use of models to demonstrate the basic use of the AED with the basic ABC's of resuscitation.   Anatomy and Cardiac Procedures: - Group verbal and visual presentation and models provide information about basic cardiac anatomy and function. Reviews the testing methods done to diagnose heart disease and the outcomes of the test results. Describes the treatment choices: Medical Management, Angioplasty, or Coronary Bypass Surgery for treating various heart conditions including Myocardial Infarction, Angina, Valve Disease, and Cardiac Arrhythmias.  Written material given at graduation. Flowsheet Row Cardiac Rehab from 02/27/2022 in [facility] Cardiac and Pulmonary Rehab  Education need identified 01/30/22  Date 02/20/22  Educator SB  Instruction Review Code 1- Verbalizes Understanding       Medication Safety: - Group verbal and visual instruction to review commonly prescribed medications for heart and lung disease. Reviews the medication, class of the drug, and side effects. Includes the steps to properly store meds and maintain the prescription regimen.  Written material given at graduation.   Intimacy: - Group verbal instruction through game format to discuss how heart and lung disease can affect sexual intimacy. Written material given at graduation..   Know  Your Numbers and Heart Failure: - Group verbal and visual instruction to discuss disease risk factors for cardiac and pulmonary disease and treatment options.  Reviews associated critical values for Overweight/Obesity, Hypertension, Cholesterol, and Diabetes.  Discusses basics of heart failure: signs/symptoms and treatments.  Introduces Heart Failure Zone chart for action plan for heart failure.  Written material given at graduation.   Infection Prevention: - Provides verbal and written material to individual with discussion of infection control including proper hand washing and proper equipment cleaning during exercise session. Flowsheet Row Cardiac Rehab from 02/27/2022 in [facility] Cardiac and Pulmonary Rehab  Date 01/30/22  Educator NT  Instruction Review Code 1- Verbalizes Understanding       Falls Prevention: - Provides verbal and written material to individual with discussion of falls prevention and safety. Flowsheet Row Cardiac Rehab from 02/27/2022 in [facility] Cardiac and Pulmonary Rehab  Date 01/29/22  Educator SB  Instruction Review Code 1- Verbalizes Understanding       Other: -Provides group and verbal instruction on various topics (see comments) Flowsheet Row Cardiac Rehab from 02/27/2022 in [facility] Cardiac and Pulmonary Rehab  Date 02/27/22  Educator SB  Instruction Review Code 1- Verbalizes Understanding       Knowledge Questionnaire Score:  Knowledge Questionnaire Score - 01/30/22 1052       Knowledge Questionnaire Score   Pre Score 23/26             Core Components/Risk Factors/Patient Goals at Admission:  Personal Goals and Risk Factors at Admission - 01/30/22 1058       Core Components/Risk Factors/Patient Goals on Admission    Weight Management Yes    Admit Weight 136 lb 8 oz (61.9 kg)    Goal Weight: Long Term 135 lb (61.2 kg)    Expected Outcomes Short Term: Continue to assess and modify interventions until short term weight is achieved;Long Term:  Adherence to nutrition and physical activity/exercise program aimed toward attainment of established weight goal;Weight Maintenance: Understanding of the daily nutrition guidelines, which includes 25-35% calories from fat, 7% or less cal from saturated fats, less than 224m cholesterol, less than 1.5gm of sodium, & 5 or more servings of fruits and vegetables daily    Diabetes Yes    Intervention Provide education about signs/symptoms and action to take for  hypo/hyperglycemia.;Provide education about proper nutrition, including hydration, and aerobic/resistive exercise prescription along with prescribed medications to achieve blood glucose in normal ranges: Fasting glucose 65-99 mg/dL    Expected Outcomes Short Term: Participant verbalizes understanding of the signs/symptoms and immediate care of hyper/hypoglycemia, proper foot care and importance of medication, aerobic/resistive exercise and nutrition plan for blood glucose control.;Long Term: Attainment of HbA1C < 7%.    Hypertension Yes    Intervention Provide education on lifestyle modifcations including regular physical activity/exercise, weight management, moderate sodium restriction and increased consumption of fresh fruit, vegetables, and low fat dairy, alcohol moderation, and smoking cessation.;Monitor prescription use compliance.    Expected Outcomes Short Term: Continued assessment and intervention until BP is < 140/15m HG in hypertensive participants. < 130/835mHG in hypertensive participants with diabetes, heart failure or chronic kidney disease.;Long Term: Maintenance of blood pressure at goal levels.    Lipids Yes    Intervention Provide education and support for participant on nutrition & aerobic/resistive exercise along with prescribed medications to achieve LDL <7046mHDL >47m37m  Expected Outcomes Short Term: Participant states understanding of desired cholesterol values and is compliant with medications prescribed. Participant is  following exercise prescription and nutrition guidelines.;Long Term: Cholesterol controlled with medications as prescribed, with individualized exercise RX and with personalized nutrition plan. Value goals: LDL < 70mg44mL > 40 mg.             Education:Diabetes - Individual verbal and written instruction to review signs/symptoms of diabetes, desired ranges of glucose level fasting, after meals and with exercise. Acknowledge that pre and post exercise glucose checks will be done for 3 sessions at entry of program.   Core Components/Risk Factors/Patient Goals Review:   Goals and Risk Factor Review     Row Name 02/18/22 1136             Core Components/Risk Factors/Patient Goals Review   Personal Goals Review Weight Management/Obesity;Hypertension;Lipids       Review Pam is doing well in rehab so far.  She has been able to maintain her weight since her anemia bout in October.  She is pleased to be able to maintain.  She usually will lose more during summer too when she is outside walking and working more.  She is also going to gym to use weights too to help.  Her blood pressures are doing well for most part recently.  Her numbers were high post surgery but since adding more medicaitons, her pressures are doing better overalll. She has not had any symptoms with the higher reading, but now she is jumpy. She is down to the 140s now.  She checks them 2-3 times a day at home.       Expected Outcomes Short: Continue to keep close eye on blood pressures  Long: Conitnue to monitor risk factors.                Core Components/Risk Factors/Patient Goals at Discharge (Final Review):   Goals and Risk Factor Review - 02/18/22 1136       Core Components/Risk Factors/Patient Goals Review   Personal Goals Review Weight Management/Obesity;Hypertension;Lipids    Review Pam is doing well in rehab so far.  She has been able to maintain her weight since her anemia bout in October.  She is pleased to  be able to maintain.  She usually will lose more during summer too when she is outside walking and working more.  She is also going to gym to  use weights too to help.  Her blood pressures are doing well for most part recently.  Her numbers were high post surgery but since adding more medicaitons, her pressures are doing better overalll. She has not had any symptoms with the higher reading, but now she is jumpy. She is down to the 140s now.  She checks them 2-3 times a day at home.    Expected Outcomes Short: Continue to keep close eye on blood pressures  Long: Conitnue to monitor risk factors.             ITP Comments:  ITP Comments     Row Name 01/29/22 1028 01/30/22 1052 02/05/22 1104 02/13/22 1120 03/05/22 1530   ITP Comments Virtual orientation call completed today. shehas an appointment on Date: 01/30/2022  for EP eval and gym Orientation.  Documentation of diagnosis can be found in [facility] Date: 01/14/2022 . Completed 6MWT and gym orientation. Initial ITP created and sent for review to Dr. Emily Filbert, Medical Director. 30 Day review completed. Medical Director ITP review done, changes made as directed, and signed approval by Medical Director.   new to program First full day of exercise!  Patient was oriented to gym and equipment including functions, settings, policies, and procedures.  Patient's individual exercise prescription and treatment plan were reviewed.  All starting workloads were established based on the results of the 6 minute walk test done at initial orientation visit.  The plan for exercise progression was also introduced and progression will be customized based on patient's performance and goals. 30 day review completed. ITP sent to Dr. Emily Filbert, Medical Director of Cardiac Rehab. Continue with ITP unless changes are made by physician.            Comments: 30 day review

## 2022-03-06 ENCOUNTER — Encounter: Payer: BC Managed Care – PPO | Admitting: *Deleted

## 2022-03-06 DIAGNOSIS — Z952 Presence of prosthetic heart valve: Secondary | ICD-10-CM

## 2022-03-06 NOTE — Progress Notes (Signed)
Daily Session Note  Patient Details  Name: Mandy Delacruz MRN: [id number] Date of Birth: 1945/03/11 Referring Provider:   Flowsheet Row Cardiac Rehab from 01/30/2022 in [facility] Cardiac and Pulmonary Rehab  Referring Provider [name] MD       Encounter Date: 03/06/2022  Check In:  Session Check In - 03/06/22 1047       Check-In   Supervising physician immediately available to respond to emergencies See telemetry face sheet for immediately available ER MD    Location [facility]-Cardiac & Pulmonary Rehab    Staff Present Darlyne Russian, RN, ADN;Jessica Luan Pulling, MA, RCEP, CCRP, Bertram Gala, MS, ACSM CEP, Exercise Physiologist    Virtual Visit No    Medication changes reported     No    Fall or balance concerns reported    No    Warm-up and Cool-down Performed on first and last piece of equipment    Resistance Training Performed Yes    VAD Patient? No    PAD/SET Patient? No      Pain Assessment   Currently in Pain? No/denies                Social History   Tobacco Use  Smoking Status Former   Years: 45.00   Types: Cigarettes  Smokeless Tobacco Never  Tobacco Comments   maybe 5-10 cigarettes a day  starts and stops smoking every few days    Goals Met:  Independence with exercise equipment Exercise tolerated well No report of concerns or symptoms today Strength training completed today  Goals Unmet:  Not Applicable  Comments: Pt able to follow exercise prescription today without complaint.  Will continue to monitor for progression.    Dr. Emily Filbert is Medical Director for [place].  Dr. Ottie Glazier is Medical Director for [facility] Pulmonary Rehabilitation.

## 2022-03-07 ENCOUNTER — Encounter: Payer: BC Managed Care – PPO | Admitting: *Deleted

## 2022-03-07 ENCOUNTER — Telehealth: Payer: Self-pay | Admitting: Cardiology

## 2022-03-07 DIAGNOSIS — Z952 Presence of prosthetic heart valve: Secondary | ICD-10-CM | POA: Diagnosis not present

## 2022-03-07 NOTE — Progress Notes (Signed)
Daily Session Note  Patient Details  Name: Mandy Delacruz MRN: [id number] Date of Birth: [birth year] Referring Provider:   Flowsheet Row Cardiac Rehab from 01/30/2022 in [facility] Cardiac and Pulmonary Rehab  Referring Provider [name] MD       Encounter Date: 03/07/2022  Check In:  Session Check In - 03/07/22 1121       Check-In   Supervising physician immediately available to respond to emergencies See telemetry face sheet for immediately available ER MD    Location [facility]-Cardiac & Pulmonary Rehab    Staff Present Alberteen Sam, MA, RCEP, CCRP, CCET;Joseph [place]field, RCP,RRT,BSRT;Other   Darel Hong, RN BSN   Virtual Visit No    Medication changes reported     No    Fall or balance concerns reported    No    Tobacco Cessation No Change    Warm-up and Cool-down Performed on first and last piece of equipment    Resistance Training Performed Yes    VAD Patient? No    PAD/SET Patient? No      Pain Assessment   Currently in Pain? No/denies                Social History   Tobacco Use  Smoking Status Former   Years: 45.00   Types: Cigarettes  Smokeless Tobacco Never  Tobacco Comments   maybe 5-10 cigarettes a day  starts and stops smoking every few days    Goals Met:  Independence with exercise equipment Exercise tolerated well Personal goals reviewed Strength training completed today  Goals Unmet:  Not Applicable  Comments: Pt able to follow exercise prescription today without complaint.  Will continue to monitor for progression.    Dr. Emily Filbert is Medical Director for [place].  Dr. Ottie Glazier is Medical Director for [facility] Pulmonary Rehabilitation.

## 2022-03-07 NOTE — Telephone Encounter (Signed)
-----   Message from Tommie Raymond, NP sent at 03/05/2022  1:02 PM EST ----- Please let the patient know that her carotid US shows mild narrowing in both the right and left neck vessels however no critical blockages. She will need to keep her regular follow up with Dr. Sallyanne Kuster and the [facility] team on 01/21/2023.

## 2022-03-07 NOTE — Telephone Encounter (Signed)
Thank you [name]! I agree that this would not cause her hand symptoms. I am happy to send her for a head CT and neurology referral to help [place] with this. I think it would be worth having their opinion.   Mandy Delacruz

## 2022-03-07 NOTE — Telephone Encounter (Signed)
Called and spoke with patient, discussed results of carotid ultrasound.  Per Kathyrn Drown, NP: Please let the patient know that her carotid US shows mild narrowing in both the right and left neck vessels however no critical blockages. She will need to keep her regular follow up with Dr. Sallyanne Kuster and the [facility] team on 01/21/2023.   Patient asked if this mild narrowing of the right/left carotids could have caused the left hand numbness and swelling on left side of neck she experienced and discussed with Sharee Pimple at office visit on 02/24/22. I told her I did not think these were related given she does not have any major blockages, but will send to Kathyrn Drown to comment on.  Patient verbalized understanding and expressed appreciation for call.

## 2022-03-11 ENCOUNTER — Encounter: Payer: BC Managed Care – PPO | Admitting: *Deleted

## 2022-03-11 DIAGNOSIS — Z952 Presence of prosthetic heart valve: Secondary | ICD-10-CM

## 2022-03-11 NOTE — Progress Notes (Signed)
Daily Session Note  Patient Details  Name: Mandy Delacruz MRN: [id number] Date of Birth: 07/06/1945 Referring Provider:   Flowsheet Row Cardiac Rehab from 01/30/2022 in [facility] Cardiac and Pulmonary Rehab  Referring Provider [name] MD       Encounter Date: 03/11/2022  Check In:  Session Check In - 03/11/22 1101       Check-In   Supervising physician immediately available to respond to emergencies See telemetry face sheet for immediately available ER MD    Location [facility]-Cardiac & Pulmonary Rehab    Staff Present Renita Papa, RN BSN;Jessica Luan Pulling, MA, RCEP, CCRP, Bertram Gala, MS, ACSM CEP, Exercise Physiologist;Noah Tickle, BS, Exercise Physiologist    Virtual Visit No    Medication changes reported     No    Fall or balance concerns reported    No    Warm-up and Cool-down Performed on first and last piece of equipment    Resistance Training Performed Yes    VAD Patient? No    PAD/SET Patient? No      Pain Assessment   Currently in Pain? No/denies                Social History   Tobacco Use  Smoking Status Former   Years: 45.00   Types: Cigarettes  Smokeless Tobacco Never  Tobacco Comments   maybe 5-10 cigarettes a day  starts and stops smoking every few days    Goals Met:  Independence with exercise equipment Exercise tolerated well No report of concerns or symptoms today Strength training completed today  Goals Unmet:  Not Applicable  Comments: Pt able to follow exercise prescription today without complaint.  Will continue to monitor for progression.    Dr. Emily Filbert is Medical Director for [place].  Dr. Ottie Glazier is Medical Director for [facility] Pulmonary Rehabilitation.

## 2022-03-12 DIAGNOSIS — N393 Stress incontinence (female) (male): Secondary | ICD-10-CM | POA: Diagnosis not present

## 2022-03-12 DIAGNOSIS — N281 Cyst of kidney, acquired: Secondary | ICD-10-CM | POA: Diagnosis not present

## 2022-03-12 DIAGNOSIS — R8271 Bacteriuria: Secondary | ICD-10-CM | POA: Diagnosis not present

## 2022-03-13 ENCOUNTER — Encounter: Payer: BC Managed Care – PPO | Admitting: *Deleted

## 2022-03-13 DIAGNOSIS — Z952 Presence of prosthetic heart valve: Secondary | ICD-10-CM

## 2022-03-13 NOTE — Progress Notes (Signed)
Daily Session Note  Patient Details  Name: ALEENAH JUSTUS MRN: [id number] Date of Birth: 03-11-1945 Referring Provider:   Flowsheet Row Cardiac Rehab from 01/30/2022 in [facility] Cardiac and Pulmonary Rehab  Referring Provider [name] MD       Encounter Date: 03/13/2022  Check In:  Session Check In - 03/13/22 1040       Check-In   Supervising physician immediately available to respond to emergencies See telemetry face sheet for immediately available ER MD    Location [facility]-Cardiac & Pulmonary Rehab    Staff Present Larna Daughters, MS, ACSM CEP, Exercise Physiologist;Jessica Luan Pulling, MA, RCEP, CCRP, CCET;Laureen Owens Shark, BS, RRT, CPFT;Megan Tamala Julian, RN, [place]   Darel Hong RN BSN   Virtual Visit No    Medication changes reported     No    Fall or balance concerns reported    No    Warm-up and Cool-down Performed on first and last piece of equipment    Resistance Training Performed Yes    VAD Patient? No    PAD/SET Patient? No      Pain Assessment   Currently in Pain? No/denies                Social History   Tobacco Use  Smoking Status Former   Years: 45.00   Types: Cigarettes  Smokeless Tobacco Never  Tobacco Comments   maybe 5-10 cigarettes a day  starts and stops smoking every few days    Goals Met:  Independence with exercise equipment Exercise tolerated well No report of concerns or symptoms today Strength training completed today  Goals Unmet:  Not Applicable  Comments: Pt able to follow exercise prescription today without complaint.  Will continue to monitor for progression.    Dr. Emily Filbert is Medical Director for [employer].  Dr. Ottie Glazier is Medical Director for [facility] Pulmonary Rehabilitation.

## 2022-03-14 ENCOUNTER — Encounter: Payer: BC Managed Care – PPO | Attending: Cardiovascular Disease | Admitting: *Deleted

## 2022-03-14 DIAGNOSIS — Z952 Presence of prosthetic heart valve: Secondary | ICD-10-CM | POA: Insufficient documentation

## 2022-03-14 NOTE — Progress Notes (Signed)
Daily Session Note  Patient Details  Name: Mandy Delacruz MRN: [id number] Date of Birth: 04-22-1945 Referring Provider:   Flowsheet Row Cardiac Rehab from 01/30/2022 in [facility] Cardiac and Pulmonary Rehab  Referring Provider [name] MD       Encounter Date: 03/14/2022  Check In:  Session Check In - 03/14/22 1122       Check-In   Supervising physician immediately available to respond to emergencies See telemetry face sheet for immediately available ER MD    Location [facility]-Cardiac & Pulmonary Rehab    Staff Present Darlyne Russian, RN, ADN;Jessica Luan Pulling, MA, RCEP, CCRP, CCET;Joseph [place]ta, [place]    Virtual Visit No    Medication changes reported     No    Fall or balance concerns reported    No    Warm-up and Cool-down Performed on first and last piece of equipment    Resistance Training Performed Yes    VAD Patient? No    PAD/SET Patient? No      Pain Assessment   Currently in Pain? No/denies                Social History   Tobacco Use  Smoking Status Former   Years: 45.00   Types: Cigarettes  Smokeless Tobacco Never  Tobacco Comments   maybe 5-10 cigarettes a day  starts and stops smoking every few days    Goals Met:  Independence with exercise equipment Exercise tolerated well No report of concerns or symptoms today Strength training completed today  Goals Unmet:  Not Applicable  Comments: Pt able to follow exercise prescription today without complaint.  Will continue to monitor for progression.    Dr. Emily Filbert is Medical Director for [place].  Dr. Ottie Glazier is Medical Director for [facility] Pulmonary Rehabilitation.

## 2022-03-17 ENCOUNTER — Other Ambulatory Visit (HOSPITAL_COMMUNITY): Payer: BC Managed Care – PPO

## 2022-03-18 ENCOUNTER — Encounter: Payer: BC Managed Care – PPO | Admitting: *Deleted

## 2022-03-18 DIAGNOSIS — Z952 Presence of prosthetic heart valve: Secondary | ICD-10-CM

## 2022-03-18 NOTE — Telephone Encounter (Signed)
Returned call to patient and shared response from Kathyrn Drown, NP: Thank you [name]! I agree that this would not cause her hand symptoms. I am happy to send her for a head CT and neurology referral to help [place] with this. I think it would be worth having their opinion.     Patient states the hand numbness and neck/ear swelling has not bothered her again. She does not want to proceed with CT scan or neurology referral at this time. Advised patient to let us know if she changes her mind, also suggested she could speak with her PCP regarding this.  Patient verbalized understanding, also states her BP readings have improved as well.  Patient expressed appreciation for follow-up.

## 2022-03-18 NOTE — Progress Notes (Signed)
Daily Session Note  Patient Details  Name: Mandy Delacruz MRN: [id number] Date of Birth: [birth year] Referring Provider:   Flowsheet Row Cardiac Rehab from 01/30/2022 in [facility] Cardiac and Pulmonary Rehab  Referring Provider [name] MD       Encounter Date: 03/18/2022  Check In:  Session Check In - 03/18/22 1149       Check-In   Supervising physician immediately available to respond to emergencies See telemetry face sheet for immediately available ER MD    Location [facility]-Cardiac & Pulmonary Rehab    Staff Present Heath Lark, RN, BSN, CCRP;Jessica Deal, MA, RCEP, CCRP, Bertram Gala, MS, ACSM CEP, Exercise Physiologist    Virtual Visit No    Medication changes reported     No    Fall or balance concerns reported    No    Warm-up and Cool-down Performed on first and last piece of equipment    Resistance Training Performed Yes    VAD Patient? No    PAD/SET Patient? No      Pain Assessment   Currently in Pain? No/denies                Social History   Tobacco Use  Smoking Status Former   Years: 45.00   Types: Cigarettes  Smokeless Tobacco Never  Tobacco Comments   maybe 5-10 cigarettes a day  starts and stops smoking every few days    Goals Met:  Independence with exercise equipment Exercise tolerated well No report of concerns or symptoms today  Goals Unmet:  Not Applicable  Comments: Pt able to follow exercise prescription today without complaint.  Will continue to monitor for progression.    Dr. Emily Filbert is Medical Director for [place].  Dr. Ottie Glazier is Medical Director for [facility] Pulmonary Rehabilitation.

## 2022-03-20 ENCOUNTER — Encounter: Payer: BC Managed Care – PPO | Admitting: *Deleted

## 2022-03-20 DIAGNOSIS — Z952 Presence of prosthetic heart valve: Secondary | ICD-10-CM | POA: Diagnosis not present

## 2022-03-20 NOTE — Progress Notes (Signed)
Daily Session Note  Patient Details  Name: Mandy Delacruz MRN: [id number] Date of Birth: 1945-05-14 Referring Provider:   Flowsheet Row Cardiac Rehab from 01/30/2022 in [facility] Cardiac and Pulmonary Rehab  Referring Provider [name] MD       Encounter Date: 03/20/2022  Check In:  Session Check In - 03/20/22 1122       Check-In   Supervising physician immediately available to respond to emergencies See telemetry face sheet for immediately available ER MD    Location [facility]-Cardiac & Pulmonary Rehab    Staff Present Darlyne Russian, RN, ADN;Jessica Luan Pulling, MA, [place], CCRP, Bertram Gala, MS, ACSM CEP, Exercise Physiologist;Meredith Sherryll Burger, RN BSN    Virtual Visit No    Medication changes reported     No    Fall or balance concerns reported    No    Warm-up and Cool-down Performed on first and last piece of equipment    Resistance Training Performed Yes    VAD Patient? No    PAD/SET Patient? No      Pain Assessment   Currently in Pain? No/denies                Social History   Tobacco Use  Smoking Status Former   Years: 45.00   Types: Cigarettes  Smokeless Tobacco Never  Tobacco Comments   maybe 5-10 cigarettes a day  starts and stops smoking every few days    Goals Met:  Independence with exercise equipment Exercise tolerated well No report of concerns or symptoms today Strength training completed today  Goals Unmet:  Not Applicable  Comments: Pt able to follow exercise prescription today without complaint.  Will continue to monitor for progression.    Dr. Emily Filbert is Medical Director for [place].  Dr. Ottie Glazier is Medical Director for [facility] Pulmonary Rehabilitation.

## 2022-03-21 ENCOUNTER — Encounter: Payer: BC Managed Care – PPO | Admitting: *Deleted

## 2022-03-21 DIAGNOSIS — Z952 Presence of prosthetic heart valve: Secondary | ICD-10-CM | POA: Diagnosis not present

## 2022-03-21 NOTE — Progress Notes (Signed)
Daily Session Note  Patient Details  Name: KESHONNA NASSAR MRN: [id number] Date of Birth: 07-01-1945 Referring Provider:   Flowsheet Row Cardiac Rehab from 01/30/2022 in [facility] Cardiac and Pulmonary Rehab  Referring Provider [name] MD       Encounter Date: 03/21/2022  Check In:  Session Check In - 03/21/22 1139       Check-In   Supervising physician immediately available to respond to emergencies See telemetry face sheet for immediately available ER MD    Location [facility]-Cardiac & Pulmonary Rehab    Staff Present Heath Lark, RN, BSN, CCRP;Jessica [place], MA, RCEP, CCRP, CCET;Joseph [place], [place]    Virtual Visit No    Medication changes reported     No    Fall or balance concerns reported    No    Warm-up and Cool-down Performed on first and last piece of equipment    Resistance Training Performed Yes    VAD Patient? No    PAD/SET Patient? No      Pain Assessment   Currently in Pain? No/denies                Social History   Tobacco Use  Smoking Status Former   Years: 45.00   Types: Cigarettes  Smokeless Tobacco Never  Tobacco Comments   maybe 5-10 cigarettes a day  starts and stops smoking every few days    Goals Met:  Independence with exercise equipment Exercise tolerated well No report of concerns or symptoms today  Goals Unmet:  Not Applicable  Comments: Pt able to follow exercise prescription today without complaint.  Will continue to monitor for progression.    Dr. Emily Filbert is Medical Director for [place].  Dr. Ottie Glazier is Medical Director for [facility] Pulmonary Rehabilitation.

## 2022-03-25 ENCOUNTER — Encounter: Payer: BC Managed Care – PPO | Admitting: *Deleted

## 2022-03-25 DIAGNOSIS — Z952 Presence of prosthetic heart valve: Secondary | ICD-10-CM | POA: Diagnosis not present

## 2022-03-25 NOTE — Progress Notes (Signed)
Daily Session Note  Patient Details  Name: AIYAHNA FARQUHAR MRN: [id number] Date of Birth: 11-Dec-1945 Referring Provider:   Flowsheet Row Cardiac Rehab from 01/30/2022 in [facility] Cardiac and Pulmonary Rehab  Referring Provider [name] MD       Encounter Date: 03/25/2022  Check In:  Session Check In - 03/25/22 1104       Check-In   Supervising physician immediately available to respond to emergencies See telemetry face sheet for immediately available ER MD    Location [facility]-Cardiac & Pulmonary Rehab    Staff Present Renita Papa, RN BSN;Jessica Luan Pulling, MA, RCEP, CCRP, Bertram Gala, MS, ACSM CEP, Exercise Physiologist    Virtual Visit No    Medication changes reported     No    Fall or balance concerns reported    No    Warm-up and Cool-down Performed on first and last piece of equipment    Resistance Training Performed Yes    VAD Patient? No    PAD/SET Patient? No      Pain Assessment   Currently in Pain? No/denies                Social History   Tobacco Use  Smoking Status Former   Years: 45.00   Types: Cigarettes  Smokeless Tobacco Never  Tobacco Comments   maybe 5-10 cigarettes a day  starts and stops smoking every few days    Goals Met:  Independence with exercise equipment Exercise tolerated well No report of concerns or symptoms today Strength training completed today  Goals Unmet:  Not Applicable  Comments: Pt able to follow exercise prescription today without complaint.  Will continue to monitor for progression.    Dr. Emily Filbert is Medical Director for [place].  Dr. Ottie Glazier is Medical Director for [facility] Pulmonary Rehabilitation.

## 2022-03-27 ENCOUNTER — Encounter: Payer: BC Managed Care – PPO | Admitting: *Deleted

## 2022-03-27 DIAGNOSIS — Z952 Presence of prosthetic heart valve: Secondary | ICD-10-CM | POA: Diagnosis not present

## 2022-03-27 NOTE — Progress Notes (Signed)
Daily Session Note  Patient Details  Name: Mandy Delacruz MRN: [id number] Date of Birth: 11/26/1945 Referring Provider:   Flowsheet Row Cardiac Rehab from 01/30/2022 in [facility] Cardiac and Pulmonary Rehab  Referring Provider [name] MD       Encounter Date: 03/27/2022  Check In:  Session Check In - 03/27/22 1111       Check-In   Supervising physician immediately available to respond to emergencies See telemetry face sheet for immediately available ER MD    Location [facility]-Cardiac & Pulmonary Rehab    Staff Present Darlyne Russian, RN, ADN;Jessica Luan Pulling, MA, RCEP, CCRP, Bertram Gala, MS, ACSM CEP, Exercise Physiologist;Joseph Tessie Fass, [place]    Virtual Visit No    Medication changes reported     No    Fall or balance concerns reported    No    Warm-up and Cool-down Performed on first and last piece of equipment    Resistance Training Performed Yes    VAD Patient? No    PAD/SET Patient? No      Pain Assessment   Currently in Pain? No/denies                Social History   Tobacco Use  Smoking Status Former   Years: 45   Types: Cigarettes  Smokeless Tobacco Never  Tobacco Comments   maybe 5-10 cigarettes a day  starts and stops smoking every few days    Goals Met:  Independence with exercise equipment Exercise tolerated well No report of concerns or symptoms today Strength training completed today  Goals Unmet:  Not Applicable  Comments: Pt able to follow exercise prescription today without complaint.  Will continue to monitor for progression.    Dr. Emily Filbert is Medical Director for [place].  Dr. Ottie Glazier is Medical Director for [facility] Pulmonary Rehabilitation.

## 2022-03-28 ENCOUNTER — Encounter: Payer: BC Managed Care – PPO | Admitting: *Deleted

## 2022-03-28 DIAGNOSIS — Z952 Presence of prosthetic heart valve: Secondary | ICD-10-CM

## 2022-03-28 NOTE — Progress Notes (Signed)
Daily Session Note  Patient Details  Name: Mandy Delacruz MRN: [id number] Date of Birth: 1945-05-24 Referring Provider:   Flowsheet Row Cardiac Rehab from 01/30/2022 in [facility] Cardiac and Pulmonary Rehab  Referring Provider [name] MD       Encounter Date: 03/28/2022  Check In:  Session Check In - 03/28/22 1159       Check-In   Supervising physician immediately available to respond to emergencies See telemetry face sheet for immediately available ER MD    Location [facility]-Cardiac & Pulmonary Rehab    Staff Present Heath Lark, RN, BSN, CCRP;Jessica [place], MA, RCEP, CCRP, CCET;Joseph [place], [place]    Virtual Visit No    Medication changes reported     No    Fall or balance concerns reported    No    Warm-up and Cool-down Performed on first and last piece of equipment    Resistance Training Performed Yes    VAD Patient? No    PAD/SET Patient? No      Pain Assessment   Currently in Pain? No/denies                Social History   Tobacco Use  Smoking Status Former   Years: 46   Types: Cigarettes  Smokeless Tobacco Never  Tobacco Comments   maybe 5-10 cigarettes a day  starts and stops smoking every few days    Goals Met:  Independence with exercise equipment Exercise tolerated well No report of concerns or symptoms today  Goals Unmet:  Not Applicable  Comments: Pt able to follow exercise prescription today without complaint.  Will continue to monitor for progression.    Dr. Emily Filbert is Medical Director for [place].  Dr. Ottie Glazier is Medical Director for [facility] Pulmonary Rehabilitation.

## 2022-04-01 ENCOUNTER — Encounter: Payer: BC Managed Care – PPO | Admitting: *Deleted

## 2022-04-01 DIAGNOSIS — Z952 Presence of prosthetic heart valve: Secondary | ICD-10-CM

## 2022-04-01 NOTE — Progress Notes (Signed)
Daily Session Note  Patient Details  Name: Mandy Delacruz MRN: [id number] Date of Birth: November 07, 1945 Referring Provider:   Flowsheet Row Cardiac Rehab from 01/30/2022 in [facility] Cardiac and Pulmonary Rehab  Referring Provider [name] MD       Encounter Date: 04/01/2022  Check In:  Session Check In - 04/01/22 1109       Check-In   Supervising physician immediately available to respond to emergencies See telemetry face sheet for immediately available ER MD    Location [facility]-Cardiac & Pulmonary Rehab    Staff Present Renita Papa, RN BSN;Jessica Luan Pulling, MA, RCEP, CCRP, CCET;Melissa [place], [place], LDN    Virtual Visit No    Medication changes reported     No    Fall or balance concerns reported    No    Warm-up and Cool-down Performed on first and last piece of equipment    Resistance Training Performed Yes    VAD Patient? No    PAD/SET Patient? No      Pain Assessment   Currently in Pain? No/denies                Social History   Tobacco Use  Smoking Status Former   Years: 46   Types: Cigarettes  Smokeless Tobacco Never  Tobacco Comments   maybe 5-10 cigarettes a day  starts and stops smoking every few days    Goals Met:  Independence with exercise equipment Exercise tolerated well No report of concerns or symptoms today Strength training completed today  Goals Unmet:  Not Applicable  Comments: Pt able to follow exercise prescription today without complaint.  Will continue to monitor for progression.    Dr. Emily Filbert is Medical Director for [place].  Dr. Ottie Glazier is Medical Director for [facility] Pulmonary Rehabilitation.

## 2022-04-02 ENCOUNTER — Encounter: Payer: Self-pay | Admitting: *Deleted

## 2022-04-02 DIAGNOSIS — Z952 Presence of prosthetic heart valve: Secondary | ICD-10-CM

## 2022-04-02 NOTE — Progress Notes (Signed)
Cardiac Individual Treatment Plan  Patient Details  Name: Mandy Delacruz MRN: [id number] Date of Birth: [birth year] Referring Provider:   Flowsheet Row Cardiac Rehab from 01/30/2022 in [facility] Cardiac and Pulmonary Rehab  Referring Provider [name] MD       Initial Encounter Date:  Flowsheet Row Cardiac Rehab from 01/30/2022 in [facility] Cardiac and Pulmonary Rehab  Date 01/30/22       Visit Diagnosis: S/P TAVR (transcatheter aortic valve replacement)  Patient's Home Medications on Admission:  Current Outpatient Medications:    ACCU-CHEK GUIDE test strip, 2 (two) times daily. as directed, Disp: , Rfl:    acetaminophen (TYLENOL) 650 MG CR tablet, Take 1,300 mg by mouth every 8 (eight) hours as needed for pain., Disp: , Rfl:    albuterol (VENTOLIN HFA) 108 (90 Base) MCG/ACT inhaler, Inhale 2 puffs into the lungs every 6 (six) hours as needed for wheezing or shortness of breath., Disp: , Rfl:    amoxicillin (AMOXIL) 500 MG tablet, Take 4 tablets (2,000 mg total) by mouth as directed. 1 hour prior to dental work including cleanings, Disp: 12 tablet, Rfl: 12   aspirin 81 MG chewable tablet, Chew 1 tablet (81 mg total) by mouth daily. Okay to swallow whole., Disp: , Rfl:    diphenhydrAMINE (BENADRYL) 25 MG tablet, Take 25 mg by mouth at bedtime., Disp: , Rfl:    ferrous sulfate 325 (65 FE) MG tablet, Take 1 tablet (325 mg total) by mouth daily., Disp: 30 tablet, Rfl: 3   fexofenadine (ALLEGRA) 180 MG tablet, Take 180 mg by mouth daily as needed for allergies or rhinitis., Disp: , Rfl:    losartan-hydrochlorothiazide (HYZAAR) 50-12.5 MG tablet, Take 1 tablet by mouth daily. (Patient not taking: Reported on 02/24/2022), Disp: 90 tablet, Rfl: 3   losartan-hydrochlorothiazide (HYZAAR) 50-12.5 MG tablet, Take 2 tablets by mouth daily., Disp: , Rfl:    pantoprazole (PROTONIX) 40 MG tablet, Take 1 tablet (40 mg total) by mouth daily. (Patient taking differently: Take 40 mg by mouth at bedtime.),  Disp: 30 tablet, Rfl: 2   pravastatin (PRAVACHOL) 40 MG tablet, TAKE 1 TABLET(40 MG) BY MOUTH EVERY EVENING, Disp: 90 tablet, Rfl: 0   SYMBICORT 80-4.5 MCG/ACT inhaler, Take 2 puffs by mouth 2 (two) times daily., Disp: , Rfl: 11  Past Medical History: Past Medical History:  Diagnosis Date   Anemia    Asthma    Bronchitis    on bactrin since 12-27-14   Colon polyps 2001   Diverticulosis of colon (without mention of hemorrhage) 2001   Dizziness    inner ear problem at times, saw dr  [name] for   GERD (gastroesophageal reflux disease)    GI bleed    Hiatal hernia 2001   Hyperlipidemia    Hypertension    IH (inguinal hernia)    left   Lung nodule    Other esophagitis 2001   Severe aortic stenosis    Tobacco abuse     Tobacco Use: Social History   Tobacco Use  Smoking Status Former   Years: 46   Types: Cigarettes  Smokeless Tobacco Never  Tobacco Comments   maybe 5-10 cigarettes a day  starts and stops smoking every few days    Labs: Review Flowsheet       Latest Ref Rng & Units 10/25/2021 12/19/2021 01/14/2022  Labs for ITP Cardiac and Pulmonary Rehab  Hemoglobin A1c 4.8 - 5.6 % 6.0  - -  PH, Arterial 7.35 - 7.45 - 7.391  -  PCO2 arterial 32 - 48 mmHg - 34.7  -  Bicarbonate 20.0 - 28.0 mmol/L - 25.0  21.1  -  TCO2 22 - 32 mmol/L - 26  22  22  24    Acid-base deficit 0.0 - 2.0 mmol/L - 1.0  3.0  -  O2 Saturation % - 67  94  -     Exercise Target Goals: Exercise Program Goal: Individual exercise prescription set using results from initial 6 min walk test and THRR while considering  patient's activity barriers and safety.   Exercise Prescription Goal: Initial exercise prescription builds to 30-45 minutes a day of aerobic activity, 2-3 days per week.  Home exercise guidelines will be given to patient during program as part of exercise prescription that the participant will acknowledge.   Education: Aerobic Exercise: - Group verbal and visual presentation  on the components of exercise prescription. Introduces F.I.T.T principle from ACSM for exercise prescriptions.  Reviews F.I.T.T. principles of aerobic exercise including progression. Written material given at graduation.   Education: Resistance Exercise: - Group verbal and visual presentation on the components of exercise prescription. Introduces F.I.T.T principle from ACSM for exercise prescriptions  Reviews F.I.T.T. principles of resistance exercise including progression. Written material given at graduation.    Education: Exercise & Equipment Safety: - Individual verbal instruction and demonstration of equipment use and safety with use of the equipment. Flowsheet Row Cardiac Rehab from 03/27/2022 in [facility] Cardiac and Pulmonary Rehab  Date 01/30/22  Educator NT  Instruction Review Code 1- Verbalizes Understanding       Education: Exercise Physiology & General Exercise Guidelines: - Group verbal and written instruction with models to review the exercise physiology of the cardiovascular system and associated critical values. Provides general exercise guidelines with specific guidelines to those with heart or lung disease.    Education: Flexibility, Balance, Mind/Body Relaxation: - Group verbal and visual presentation with interactive activity on the components of exercise prescription. Introduces F.I.T.T principle from ACSM for exercise prescriptions. Reviews F.I.T.T. principles of flexibility and balance exercise training including progression. Also discusses the mind body connection.  Reviews various relaxation techniques to help reduce and manage stress (i.e. Deep breathing, progressive muscle relaxation, and visualization). Balance handout provided to take home. Written material given at graduation. Flowsheet Row Cardiac Rehab from 03/27/2022 in [facility] Cardiac and Pulmonary Rehab  Date 02/13/22  Educator [facility]  Instruction Review Code 1- Verbalizes Understanding       Activity Barriers &  Risk Stratification:  Activity Barriers & Cardiac Risk Stratification - 01/30/22 1333       Activity Barriers & Cardiac Risk Stratification   Activity Barriers Arthritis;Back Problems;Joint Problems;Muscular Weakness   left foot numbness   Cardiac Risk Stratification Low             6 Minute Walk:  6 Minute Walk     Row Name 01/30/22 1332         6 Minute Walk   Phase Initial     Distance 1210 feet     Walk Time 6 minutes     # of Rest Breaks 0     MPH 2.29     METS 2.97     RPE 12     Perceived Dyspnea  0     VO2 Peak 10.39     Symptoms Yes (comment)     Comments Calf tightness     Resting HR 63 bpm     Resting BP 140/66     Resting Oxygen  Saturation  99 %     Exercise Oxygen Saturation  during 6 min walk 98 %     Max Ex. HR 104 bpm     Max Ex. BP 168/74     2 Minute Post BP 150/70              Oxygen Initial Assessment:   Oxygen Re-Evaluation:   Oxygen Discharge (Final Oxygen Re-Evaluation):   Initial Exercise Prescription:  Initial Exercise Prescription - 01/30/22 1300       Date of Initial Exercise RX and Referring Provider   Date 01/30/22    Referring Provider Croitoru, Mihai MD      Oxygen   Maintain Oxygen Saturation 88% or higher      Treadmill   MPH 2.1    Grade 1    Minutes 15    METs 2.9      Recumbant Bike   Level 2    RPM 50    Watts 19    Minutes 15    METs 2.97      NuStep   Level 2    SPM 80    Minutes 15    METs 2.97      Prescription Details   Frequency (times per week) 3    Duration Progress to 30 minutes of continuous aerobic without signs/symptoms of physical distress      Intensity   THRR 40-80% of Max Heartrate 95-128    Ratings of Perceived Exertion 11-13    Perceived Dyspnea 0-4      Progression   Progression Continue to progress workloads to maintain intensity without signs/symptoms of physical distress.      Resistance Training   Training Prescription Yes    Weight 3 lb    Reps 10-15              Perform Capillary Blood Glucose checks as needed.  Exercise Prescription Changes:   Exercise Prescription Changes     Row Name 01/30/22 1300 02/18/22 1100 02/27/22 0800 03/13/22 1500 03/25/22 1400     Response to Exercise   Blood Pressure (Admit) 140/66 -- 122/64 122/64 118/68   Blood Pressure (Exercise) 168/74 -- 124/60 148/64 --   Blood Pressure (Exit) 150/70 -- 124/62 122/60 102/52   Heart Rate (Admit) 63 bpm -- 57 bpm 86 bpm 75 bpm   Heart Rate (Exercise) 104 bpm -- 106 bpm 121 bpm 116 bpm   Heart Rate (Exit) 76 bpm -- 88 bpm 92 bpm 84 bpm   Oxygen Saturation (Admit) 99 % -- -- -- --   Oxygen Saturation (Exercise) 98 % -- -- -- --   Oxygen Saturation (Exit) 98 % -- -- -- --   Rating of Perceived Exertion (Exercise) 12 -- 13 13 12    Perceived Dyspnea (Exercise) 0 -- -- -- --   Symptoms Calf tightness -- none none none   Comments 6MWT Results -- 2nd full week of exercise -- --   Duration -- -- Continue with 30 min of aerobic exercise without signs/symptoms of physical distress. Continue with 30 min of aerobic exercise without signs/symptoms of physical distress. Continue with 30 min of aerobic exercise without signs/symptoms of physical distress.   Intensity -- -- THRR unchanged THRR unchanged THRR unchanged     Progression   Progression -- -- Continue to progress workloads to maintain intensity without signs/symptoms of physical distress. Continue to progress workloads to maintain intensity without signs/symptoms of physical distress. Continue to progress workloads to maintain intensity  without signs/symptoms of physical distress.   Average METs -- -- 2.67 3.04 3.6     Resistance Training   Training Prescription -- -- Yes Yes Yes   Weight -- -- 3 lb 3 lb 4 lb   Reps -- -- 10-15 10-15 10-15     Interval Training   Interval Training -- -- No No No     Treadmill   MPH -- -- 2.1 2.5 2.8   Grade -- -- 1 2 3    Minutes -- -- 15 15 15    METs -- -- 2.9 3.6 4.3      Recumbant Bike   Level -- -- 1 2 3    Watts -- -- 19 19 19    Minutes -- -- 15 15 15    METs -- -- 2.98 2.2 4     NuStep   Level -- -- 4 6 5    Minutes -- -- 15 15 15    METs -- -- -- 3.2 3.5     Recumbant Elliptical   Level -- -- -- -- 2   Minutes -- -- -- -- 15   METs -- -- -- -- 1.8     Biostep-RELP   Level -- -- -- -- 3   Minutes -- -- -- -- 15   METs -- -- -- -- 3     Home Exercise Plan   Plans to continue exercise at -- Home (comment)  walking, [facility] (comment)  walking, [facility] (comment)  walking, [facility] (comment)  walking, [facility]   Frequency -- Add 2 additional days to program exercise sessions. Add 2 additional days to program exercise sessions. Add 2 additional days to program exercise sessions. Add 2 additional days to program exercise sessions.   Initial Home Exercises Provided -- 02/18/22 02/18/22 02/18/22 02/18/22     Oxygen   Maintain Oxygen Saturation -- -- 88% or higher 88% or higher 88% or higher            Exercise Comments:   Exercise Comments     Row Name 02/13/22 1120           Exercise Comments First full day of exercise!  Patient was oriented to gym and equipment including functions, settings, policies, and procedures.  Patient's individual exercise prescription and treatment plan were reviewed.  All starting workloads were established based on the results of the 6 minute walk test done at initial orientation visit.  The plan for exercise progression was also introduced and progression will be customized based on patient's performance and goals.                Exercise Goals and Review:   Exercise Goals     Row Name 01/30/22 1307             Exercise Goals   Increase Physical Activity Yes       Intervention Provide advice, education, support and counseling about physical activity/exercise needs.;Develop an individualized exercise prescription for aerobic and resistive training  based on initial evaluation findings, risk stratification, comorbidities and participant's personal goals.       Expected Outcomes Short Term: Attend rehab on a regular basis to increase amount of physical activity.;Long Term: Add in home exercise to make exercise part of routine and to increase amount of physical activity.;Long Term: Exercising regularly at least 3-5 days a week.       Increase Strength and Stamina Yes  Intervention Provide advice, education, support and counseling about physical activity/exercise needs.;Develop an individualized exercise prescription for aerobic and resistive training based on initial evaluation findings, risk stratification, comorbidities and participant's personal goals.       Expected Outcomes Short Term: Increase workloads from initial exercise prescription for resistance, speed, and METs.;Short Term: Perform resistance training exercises routinely during rehab and add in resistance training at home;Long Term: Improve cardiorespiratory fitness, muscular endurance and strength as measured by increased METs and functional capacity (6MWT)       Able to understand and use rate of perceived exertion (RPE) scale Yes       Intervention Provide education and explanation on how to use RPE scale       Expected Outcomes Short Term: Able to use RPE daily in rehab to express subjective intensity level;Long Term:  Able to use RPE to guide intensity level when exercising independently       Able to understand and use Dyspnea scale Yes       Intervention Provide education and explanation on how to use Dyspnea scale       Expected Outcomes Short Term: Able to use Dyspnea scale daily in rehab to express subjective sense of shortness of breath during exertion;Long Term: Able to use Dyspnea scale to guide intensity level when exercising independently       Knowledge and understanding of Target Heart Rate Range (THRR) Yes       Intervention Provide education and explanation of  THRR including how the numbers were predicted and where they are located for reference       Expected Outcomes Short Term: Able to state/look up THRR;Long Term: Able to use THRR to govern intensity when exercising independently;Short Term: Able to use daily as guideline for intensity in rehab       Able to check pulse independently Yes       Intervention Provide education and demonstration on how to check pulse in carotid and radial arteries.;Review the importance of being able to check your own pulse for safety during independent exercise       Expected Outcomes Short Term: Able to explain why pulse checking is important during independent exercise;Long Term: Able to check pulse independently and accurately       Understanding of Exercise Prescription Yes       Intervention Provide education, explanation, and written materials on patient's individual exercise prescription       Expected Outcomes Short Term: Able to explain program exercise prescription;Long Term: Able to explain home exercise prescription to exercise independently                Exercise Goals Re-Evaluation :  Exercise Goals Re-Evaluation     Row Name 02/13/22 1120 02/18/22 1126 02/27/22 0826 03/11/22 1128 03/13/22 1558     Exercise Goal Re-Evaluation   Exercise Goals Review Able to understand and use rate of perceived exertion (RPE) scale;Able to understand and use Dyspnea scale;Knowledge and understanding of Target Heart Rate Range (THRR);Understanding of Exercise Prescription Able to understand and use rate of perceived exertion (RPE) scale;Able to understand and use Dyspnea scale;Knowledge and understanding of Target Heart Rate Range (THRR);Understanding of Exercise Prescription;Increase Strength and Stamina;Increase Physical Activity Increase Physical Activity;Increase Strength and Stamina;Understanding of Exercise Prescription Increase Physical Activity;Increase Strength and Stamina;Understanding of Exercise Prescription  Increase Physical Activity;Increase Strength and Stamina;Understanding of Exercise Prescription   Comments Reviewed RPE scale, THR and program prescription with pt today.  Pt voiced understanding and was given a  copy of goals to take home. Reviewed home exercise with pt today.  Pt plans to walking and go to [facility]) for exercise.  Reviewed THR, pulse, RPE, sign and symptoms, pulse oximetery and when to call 911 or MD.  Also discussed weather considerations and indoor options.  Pt voiced understanding.    She has noted that she has had some numbness and pain with walking.  She has another follow up on Monday for her murmur and legs.  Pam is also wanting to work on her balance some. Pam is doing well for her first couple of weeks in rehab. She has already been able to increase her T4 Nustep to level 4. Other than that, she has been following her inital exercise prescription well. RPEs are staying in appropriate range and she hits her THR most sessions. Will continue to monitor her progression. Pam is doing well in rehab.  She has started to go to the [facility] now twice a week.  She made her goal to exercise 5 days a week.  She will go and use the machines and lift some weights as well.  She is feeling stronger and better overall. Pam continues to do well in rehab. She recently increased her overall average MET level to 3.04 METs. She also improved to level 2 on the recumbent bike and level 6 on the T4 nustep. She increased her treadmill workload as well, to a speed of 2.5 mph with an incline of 2%. We will continue to monitor her progress in the program.   Expected Outcomes Short: Use RPE daily to regulate intensity. Long: Follow program prescription in THR. Short: Talk to doctor about legs and start to add in exercise Long; continue to improve stamina Short: Start to increase treadmill workload, speed Long: Increase overall stamina and strength Short: Continue to go to [facility] on off days Long: Continue to  improve stamina Short: Continue to progressively increase treadmill workload. Long: Continue to improve strength and stamina.    [place] Name 03/25/22 1453             Exercise Goal Re-Evaluation   Exercise Goals Review Increase Physical Activity;Increase Strength and Stamina;Understanding of Exercise Prescription       Comments Pam continues to do well in rehab. She started using 4 lb for handweights! She also increased her treadmill speed to a 2.8 mph with a 3% incline. She tried out the Biostep at level 3 for the first time and did well. Will continue to monitor.       Expected Outcomes Short: continue to increase workload on treadmill Long: Continue to increase overall MET level and stamina                Discharge Exercise Prescription (Final Exercise Prescription Changes):  Exercise Prescription Changes - 03/25/22 1400       Response to Exercise   Blood Pressure (Admit) 118/68    Blood Pressure (Exit) 102/52    Heart Rate (Admit) 75 bpm    Heart Rate (Exercise) 116 bpm    Heart Rate (Exit) 84 bpm    Rating of Perceived Exertion (Exercise) 12    Symptoms none    Duration Continue with 30 min of aerobic exercise without signs/symptoms of physical distress.    Intensity THRR unchanged      Progression   Progression Continue to progress workloads to maintain intensity without signs/symptoms of physical distress.    Average METs 3.6      Resistance Training  Training Prescription Yes    Weight 4 lb    Reps 10-15      Interval Training   Interval Training No      Treadmill   MPH 2.8    Grade 3    Minutes 15    METs 4.3      Recumbant Bike   Level 3    Watts 19    Minutes 15    METs 4      NuStep   Level 5    Minutes 15    METs 3.5      Recumbant Elliptical   Level 2    Minutes 15    METs 1.8      Biostep-RELP   Level 3    Minutes 15    METs 3      Home Exercise Plan   Plans to continue exercise at Home (comment)   walking, [facility]    Frequency Add 2 additional days to program exercise sessions.    Initial Home Exercises Provided 02/18/22      Oxygen   Maintain Oxygen Saturation 88% or higher             Nutrition:  Target Goals: Understanding of nutrition guidelines, daily intake of sodium 1500mg , cholesterol 200mg , calories 30% from fat and 7% or less from saturated fats, daily to have 5 or more servings of fruits and vegetables.  Education: All About Nutrition: -Group instruction provided by verbal, written material, interactive activities, discussions, models, and posters to present general guidelines for heart healthy nutrition including fat, fiber, MyPlate, the role of sodium in heart healthy nutrition, utilization of the nutrition label, and utilization of this knowledge for meal planning. Follow up email sent as well. Written material given at graduation. Flowsheet Row Cardiac Rehab from 03/27/2022 in [facility] Cardiac and Pulmonary Rehab  Education need identified 01/30/22       Biometrics:  Pre Biometrics - 01/30/22 1334       Pre Biometrics   Height 5' 4.25" (1.632 m)    Weight 136 lb 8 oz (61.9 kg)    Waist Circumference 37 inches    Hip Circumference 37.5 inches    Waist to Hip Ratio 0.99 %    BMI (Calculated) 23.25    Single Leg Stand 11.8 seconds   L             Nutrition Therapy Plan and Nutrition Goals:  Nutrition Therapy & Goals - 02/18/22 1131       Nutrition Therapy   RD appointment deferred Yes             Nutrition Assessments:  MEDIFICTS Score Key: ?70 Need to make dietary changes  40-70 Heart Healthy Diet ? 40 Therapeutic Level Cholesterol Diet  Flowsheet Row Cardiac Rehab from 01/30/2022 in [facility] Cardiac and Pulmonary Rehab  Picture Your Plate Total Score on Admission 55      Picture Your Plate Scores: [id number] Unhealthy dietary pattern with much room for improvement. 41-50 Dietary pattern unlikely to meet recommendations for good health and room for  improvement. 51-60 More healthful dietary pattern, with some room for improvement.  >60 Healthy dietary pattern, although there may be some specific behaviors that could be improved.    Nutrition Goals Re-Evaluation:  Nutrition Goals Re-Evaluation     [place] Name 02/18/22 1131 03/11/22 1125           Goals   Nutrition Goal Defer Nutrition Appt  Work on weight loss  Short: Continue to cook at home and watch sodium. Long: Conitnue to reduce sodium      Comment Pam feel sthat she does not need to meet with dietitian as she feels like she knows what she is supposed to eat. She is trying to eat lots of fruits and vegetables.  She is sticking to the lean proteins.  She had beef stew last night for dinner, that she made herself.  Her husband loves beans so they get a lot of those in too.  She enjoys cooking at home.  She has always used salt, but is now trying to swtich over to more pepper as seasoning to reduce salt.  She is becoming more aware of it now. Pam is doing well in rehab.  She has tried out a new recipe for a yogurt cake but forgot to add the sweetner and it did not taste very good.  She was proud of herself for trying something new.  She is still getting in a good variety.  She continues to get away from salt, but it is still her weakness.      Expected Outcome Short: Continue to cook at home and watch sodium. Long: Conitnue to reduce sodium Short: Continue to cut back on sodium Long: Continue to focus on heart healthy diet               Nutrition Goals Discharge (Final Nutrition Goals Re-Evaluation):  Nutrition Goals Re-Evaluation - 03/11/22 1125       Goals   Nutrition Goal Short: Continue to cook at home and watch sodium. Long: Conitnue to reduce sodium    Comment Pam is doing well in rehab.  She has tried out a new recipe for a yogurt cake but forgot to add the sweetner and it did not taste very good.  She was proud of herself for trying something new.  She is still getting in a  good variety.  She continues to get away from salt, but it is still her weakness.    Expected Outcome Short: Continue to cut back on sodium Long: Continue to focus on heart healthy diet             Psychosocial: Target Goals: Acknowledge presence or absence of significant depression and/or stress, maximize coping skills, provide positive support system. Participant is able to verbalize types and ability to use techniques and skills needed for reducing stress and depression.   Education: Stress, Anxiety, and Depression - Group verbal and visual presentation to define topics covered.  Reviews how body is impacted by stress, anxiety, and depression.  Also discusses healthy ways to reduce stress and to treat/manage anxiety and depression.  Written material given at graduation. Flowsheet Row Cardiac Rehab from 03/27/2022 in [facility] Cardiac and Pulmonary Rehab  Date 03/27/22  Educator [facility]  Instruction Review Code 1- [employer] Understanding       Education: Sleep Hygiene -Provides group verbal and written instruction about how sleep can affect your health.  Define sleep hygiene, discuss sleep cycles and impact of sleep habits. Review good sleep hygiene tips.    Initial Review & Psychosocial Screening:  Initial Psych Review & Screening - 01/29/22 1016       Initial Review   Current issues with None Identified      Family Dynamics   Good Support System? Yes   husband, children near by     Barriers   Psychosocial barriers to participate in program There are no identifiable barriers or psychosocial needs.  Screening Interventions   Interventions Encouraged to exercise;To provide support and resources with identified psychosocial needs;Provide feedback about the scores to participant    Expected Outcomes Short Term goal: Utilizing psychosocial counselor, staff and physician to assist with identification of specific Stressors or current issues interfering with healing process. Setting  desired goal for each stressor or current issue identified.;Long Term Goal: Stressors or current issues are controlled or eliminated.;Short Term goal: Identification and review with participant of any Quality of Life or Depression concerns found by scoring the questionnaire.;Long Term goal: The participant improves quality of Life and PHQ9 Scores as seen by post scores and/or verbalization of changes             Quality of Life Scores:   Quality of Life - 01/30/22 1057       Quality of Life   Select Quality of Life      Quality of Life Scores   Health/Function Pre 20 %    Socioeconomic Pre 25.63 %    Psych/Spiritual Pre 23.93 %    Family Pre 30 %    GLOBAL Pre 23.5 %            Scores of 19 and below usually indicate a poorer quality of life in these areas.  A difference of  2-3 points is a clinically meaningful difference.  A difference of 2-3 points in the total score of the Quality of Life Index has been associated with significant improvement in overall quality of life, self-image, physical symptoms, and general health in studies assessing change in quality of life.  PHQ-9: Review Flowsheet       01/30/2022 11/17/2017  Depression screen PHQ 2/9  Decreased Interest 0 0  Down, Depressed, Hopeless 0 0  PHQ - 2 Score 0 0  Altered sleeping 1 -  Tired, decreased energy 1 -  Change in appetite 0 -  Feeling bad or failure about yourself  0 -  Trouble concentrating 0 -  Moving slowly or fidgety/restless 0 -  Suicidal thoughts 0 -  PHQ-9 Score 2 -  Difficult doing work/chores Not difficult at all -   Interpretation of Total Score  Total Score Depression Severity:  1-4 = Minimal depression, 5-9 = Mild depression, 10-14 = Moderate depression, 15-19 = Moderately severe depression, 20-27 = Severe depression   Psychosocial Evaluation and Intervention:  Psychosocial Evaluation - 01/29/22 1029       Psychosocial Evaluation & Interventions   Interventions Encouraged to  exercise with the program and follow exercise prescription    Comments PAm has no barriers to attending the program. She is looking forward to returning to an exercise routine. 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  She stated she needs to build herself back up with her exercise. She lives with her husband and they have a 100 lb Lab. Her husband and children are her support.  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   She does have TYpe 2 Diabetes and at this time is diet controlled. She lost some weight in attempt to help control her Diabetes. She should do well with the program    Expected Outcomes STG Pam attends all scheduled sessions. she is able to progress with her exercise prescription and get back to more her exercise routine. LTG Pam continues to progress with her exercise routine.    Continue Psychosocial Services  Follow up required by staff  Psychosocial Re-Evaluation:  Psychosocial Re-Evaluation     [place] Name 02/18/22 1127 03/11/22 1120           Psychosocial Re-Evaluation   Current issues with Current Stress Concerns Current Stress Concerns      Comments Pam is off to a good start in rehab. She has noted some pain in legs with walking at home and in her shoulder. She does see her doctor later this week and plans to talk to them about it.  She denies any other major stressors currently. She is enjoying getting back to an exercise routine again. She is also wanting to to work on her balance. She is a good sleeper and gets between 8-9 hrs.  She says she needs to be more active after dinner so her food doesn't sit and she moves more versus napping in chair. Pam is doing well in rehab.  She was excited to come off the heart monitor but then started to notice that her heart was pumping harder on her.  She had taking something for allergies to get relief so she thinks they may be linked.  She did talk  to the doctor about he legs being numb and it has gotten better for her.  She is still sleeping well.  She does note that when she exercises she feels better.      Expected Outcomes Short: Start to move more and talk to doctor about pain Long: conitnue to stay positive Short; Conitnue to manage here heart and exercise for mental boost Long: Conitnue to stay positive      Interventions Encouraged to attend Cardiac Rehabilitation for the exercise;Stress management education --      Continue Psychosocial Services  Follow up required by staff --               Psychosocial Discharge (Final Psychosocial Re-Evaluation):  Psychosocial Re-Evaluation - 03/11/22 1120       Psychosocial Re-Evaluation   Current issues with Current Stress Concerns    Comments Pam is doing well in rehab.  She was excited to come off the heart monitor but then started to notice that her heart was pumping harder on her.  She had taking something for allergies to get relief so she thinks they may be linked.  She did talk to the doctor about he legs being numb and it has gotten better for her.  She is still sleeping well.  She does note that when she exercises she feels better.    Expected Outcomes Short; Conitnue to manage here heart and exercise for mental boost Long: Conitnue to stay positive             Vocational Rehabilitation: Provide vocational rehab assistance to qualifying candidates.   Vocational Rehab Evaluation & Intervention:  Vocational Rehab - 01/29/22 1024       Initial Vocational Rehab Evaluation & Intervention   Assessment shows need for Vocational Rehabilitation No      Vocational Rehab Re-Evaulation   Comments retired             Education: Education Goals: Education classes will be provided on a variety of topics geared toward better understanding of heart health and risk factor modification. Participant will state understanding/return demonstration of topics presented as noted by  education test scores.  Learning Barriers/Preferences:   General Cardiac Education Topics:  AED/CPR: - Group verbal and written instruction with the use of models to demonstrate the basic use of the AED with the basic ABC's  of resuscitation.   Anatomy and Cardiac Procedures: - Group verbal and visual presentation and models provide information about basic cardiac anatomy and function. Reviews the testing methods done to diagnose heart disease and the outcomes of the test results. Describes the treatment choices: Medical Management, Angioplasty, or Coronary Bypass Surgery for treating various heart conditions including Myocardial Infarction, Angina, Valve Disease, and Cardiac Arrhythmias.  Written material given at graduation. Flowsheet Row Cardiac Rehab from 03/27/2022 in [facility] Cardiac and Pulmonary Rehab  Education need identified 01/30/22  Date 02/20/22  Educator SB  Instruction Review Code 1- Verbalizes Understanding       Medication Safety: - Group verbal and visual instruction to review commonly prescribed medications for heart and lung disease. Reviews the medication, class of the drug, and side effects. Includes the steps to properly store meds and maintain the prescription regimen.  Written material given at graduation. Flowsheet Row Cardiac Rehab from 03/27/2022 in [facility] Cardiac and Pulmonary Rehab  Date 03/06/22  Educator [facility]  Instruction Review Code 1- Verbalizes Understanding       Intimacy: - Group verbal instruction through game format to discuss how heart and lung disease can affect sexual intimacy. Written material given at graduation..   Know Your Numbers and Heart Failure: - Group verbal and visual instruction to discuss disease risk factors for cardiac and pulmonary disease and treatment options.  Reviews associated critical values for Overweight/Obesity, Hypertension, Cholesterol, and Diabetes.  Discusses basics of heart failure: signs/symptoms and treatments.   Introduces Heart Failure Zone chart for action plan for heart failure.  Written material given at graduation. Flowsheet Row Cardiac Rehab from 03/27/2022 in [facility] Cardiac and Pulmonary Rehab  Date 03/13/22  Educator [facility]  Instruction Review Code 1- Verbalizes Understanding       Infection Prevention: - Provides verbal and written material to individual with discussion of infection control including proper hand washing and proper equipment cleaning during exercise session. Flowsheet Row Cardiac Rehab from 03/27/2022 in [facility] Cardiac and Pulmonary Rehab  Date 01/30/22  Educator NT  Instruction Review Code 1- Verbalizes Understanding       Falls Prevention: - Provides verbal and written material to individual with discussion of falls prevention and safety. Flowsheet Row Cardiac Rehab from 03/27/2022 in [facility] Cardiac and Pulmonary Rehab  Date 01/29/22  Educator SB  Instruction Review Code 1- Verbalizes Understanding       Other: -Provides group and verbal instruction on various topics (see comments) Flowsheet Row Cardiac Rehab from 03/27/2022 in [facility] Cardiac and Pulmonary Rehab  Date 02/27/22  Educator SB  Instruction Review Code 1- Verbalizes Understanding       Knowledge Questionnaire Score:  Knowledge Questionnaire Score - 01/30/22 1052       Knowledge Questionnaire Score   Pre Score 23/26             Core Components/Risk Factors/Patient Goals at Admission:  Personal Goals and Risk Factors at Admission - 01/30/22 1058       Core Components/Risk Factors/Patient Goals on Admission    Weight Management Yes    Admit Weight 136 lb 8 oz (61.9 kg)    Goal Weight: Long Term 135 lb (61.2 kg)    Expected Outcomes Short Term: Continue to assess and modify interventions until short term weight is achieved;Long Term: Adherence to nutrition and physical activity/exercise program aimed toward attainment of established weight goal;Weight Maintenance: Understanding of the daily  nutrition guidelines, which includes 25-35% calories from fat, 7% or less cal from saturated fats,  less than 200mg  cholesterol, less than 1.5gm of sodium, & 5 or more servings of fruits and vegetables daily    Diabetes Yes    Intervention Provide education about signs/symptoms and action to take for hypo/hyperglycemia.;Provide education about proper nutrition, including hydration, and aerobic/resistive exercise prescription along with prescribed medications to achieve blood glucose in normal ranges: Fasting glucose 65-99 mg/dL    Expected Outcomes Short Term: Participant verbalizes understanding of the signs/symptoms and immediate care of hyper/hypoglycemia, proper foot care and importance of medication, aerobic/resistive exercise and nutrition plan for blood glucose control.;Long Term: Attainment of HbA1C < 7%.    Hypertension Yes    Intervention Provide education on lifestyle modifcations including regular physical activity/exercise, weight management, moderate sodium restriction and increased consumption of fresh fruit, vegetables, and low fat dairy, alcohol moderation, and smoking cessation.;Monitor prescription use compliance.    Expected Outcomes Short Term: Continued assessment and intervention until BP is < 140/60mm HG in hypertensive participants. < 130/51mm HG in hypertensive participants with diabetes, heart failure or chronic kidney disease.;Long Term: Maintenance of blood pressure at goal levels.    Lipids Yes    Intervention Provide education and support for participant on nutrition & aerobic/resistive exercise along with prescribed medications to achieve LDL 70mg , HDL >40mg .    Expected Outcomes Short Term: Participant states understanding of desired cholesterol values and is compliant with medications prescribed. Participant is following exercise prescription and nutrition guidelines.;Long Term: Cholesterol controlled with medications as prescribed, with individualized exercise RX and with  personalized nutrition plan. Value goals: LDL < 70mg , HDL > 40 mg.             Education:Diabetes - Individual verbal and written instruction to review signs/symptoms of diabetes, desired ranges of glucose level fasting, after meals and with exercise. Acknowledge that pre and post exercise glucose checks will be done for 3 sessions at entry of program.   Core Components/Risk Factors/Patient Goals Review:   Goals and Risk Factor Review     Row Name 02/18/22 1136 03/11/22 1123           Core Components/Risk Factors/Patient Goals Review   Personal Goals Review Weight Management/Obesity;Hypertension;Lipids Weight Management/Obesity;Hypertension;Lipids      Review Pam is doing well in rehab so far.  She has been able to maintain her weight since her anemia bout in October.  She is pleased to be able to maintain.  She usually will lose more during summer too when she is outside walking and working more.  She is also going to gym to use weights too to help.  Her blood pressures are doing well for most part recently.  Her numbers were high post surgery but since adding more medicaitons, her pressures are doing better overalll. She has not had any symptoms with the higher reading, but now she is jumpy. She is down to the 140s now.  She checks them 2-3 times a day at home. Pam is doing well in rehab.  She came off heart monitor today, but felt her heart rate was up some after allergy meds.  Her pressures have still been running high.  They have upped her medication to twice a day, but it has not changed her numbers much.  She plans to call her cardiologist again as well.  She has an appointment tomorrow with nephrology to check on her kidney function.  Her weight is holding steady around 134-136 lb.      Expected Outcomes Short: Continue to keep close eye on blood pressures  Long: Conitnue to monitor risk factors. Short: Talk to doctor about pressures Long: Conitnue to monitor risk factors                Core Components/Risk Factors/Patient Goals at Discharge (Final Review):   Goals and Risk Factor Review - 03/11/22 1123       Core Components/Risk Factors/Patient Goals Review   Personal Goals Review Weight Management/Obesity;Hypertension;Lipids    Review Pam is doing well in rehab.  She came off heart monitor today, but felt her heart rate was up some after allergy meds.  Her pressures have still been running high.  They have upped her medication to twice a day, but it has not changed her numbers much.  She plans to call her cardiologist again as well.  She has an appointment tomorrow with nephrology to check on her kidney function.  Her weight is holding steady around 134-136 lb.    Expected Outcomes Short: Talk to doctor about pressures Long: Conitnue to monitor risk factors             ITP Comments:  ITP Comments     Row Name 01/29/22 1028 01/30/22 1052 02/05/22 1104 02/13/22 1120 03/05/22 1530   ITP Comments Virtual orientation call completed today. shehas an appointment on Date: 01/30/2022  for EP eval and gym Orientation.  Documentation of diagnosis can be found in [facility] Date: 01/14/2022 . Completed 6MWT and gym orientation. Initial ITP created and sent for review to Dr. Emily Filbert, Medical Director. 30 Day review completed. Medical Director ITP review done, changes made as directed, and signed approval by Medical Director.   new to program First full day of exercise!  Patient was oriented to gym and equipment including functions, settings, policies, and procedures.  Patient's individual exercise prescription and treatment plan were reviewed.  All starting workloads were established based on the results of the 6 minute walk test done at initial orientation visit.  The plan for exercise progression was also introduced and progression will be customized based on patient's performance and goals. 30 day review completed. ITP sent to Dr. Emily Filbert, Medical Director of Cardiac Rehab.  Continue with ITP unless changes are made by physician.    [place] Name 04/02/22 0945           ITP Comments 30 Day review completed. Medical Director ITP review done, changes made as directed, and signed approval by Medical Director.                Comments:

## 2022-04-03 ENCOUNTER — Encounter: Payer: BC Managed Care – PPO | Admitting: *Deleted

## 2022-04-03 VITALS — Ht 64.25 in | Wt 136.5 lb

## 2022-04-03 DIAGNOSIS — Z952 Presence of prosthetic heart valve: Secondary | ICD-10-CM | POA: Diagnosis not present

## 2022-04-03 NOTE — Progress Notes (Signed)
Daily Session Note  Patient Details  Name: Mandy Delacruz MRN: [id number] Date of Birth: 1945-02-18 Referring Provider:   Flowsheet Row Cardiac Rehab from 01/30/2022 in [facility] Cardiac and Pulmonary Rehab  Referring Provider [name] MD       Encounter Date: 04/03/2022  Check In:  Session Check In - 04/03/22 1107       Check-In   Supervising physician immediately available to respond to emergencies See telemetry face sheet for immediately available ER MD    Location [facility]-Cardiac & Pulmonary Rehab    Staff Present Larna Daughters, MS, ACSM CEP, Exercise Physiologist;Joseph Lookeba, RCP,RRT,BSRT;Tessa Seaberry Frederico Hamman RN, Odelia Gage, RN, ADN    Virtual Visit No    Medication changes reported     No    Fall or balance concerns reported    No    Tobacco Cessation No Change    Warm-up and Cool-down Performed on first and last piece of equipment    Resistance Training Performed Yes    VAD Patient? No    PAD/SET Patient? No      Pain Assessment   Currently in Pain? No/denies                Social History   Tobacco Use  Smoking Status Former   Years: 45   Types: Cigarettes  Smokeless Tobacco Never  Tobacco Comments   maybe 5-10 cigarettes a day  starts and stops smoking every few days    Goals Met:  Independence with exercise equipment Exercise tolerated well No report of concerns or symptoms today Strength training completed today  Goals Unmet:  Not Applicable  Comments: Pt able to follow exercise prescription today without complaint.  Will continue to monitor for progression.    Dr. Emily Filbert is Medical Director for [place].  Dr. Ottie Glazier is Medical Director for [facility] Pulmonary Rehabilitation.

## 2022-04-03 NOTE — Patient Instructions (Signed)
Discharge Patient Instructions  Patient Details  Name: Mandy Delacruz MRN: [id number] Date of Birth: 03-Feb-1945 Referring Provider:  [name], MD   Number of Visits: 39  Reason for Discharge:  Patient reached a stable level of exercise. Patient independent in their exercise. Patient has met program and personal goals.  Smoking History:  Social History   Tobacco Use  Smoking Status Former   Years: 83   Types: Cigarettes  Smokeless Tobacco Never  Tobacco Comments   maybe 5-10 cigarettes a day  starts and stops smoking every few days    Diagnosis:  S/P TAVR (transcatheter aortic valve replacement)  Initial Exercise Prescription:  Initial Exercise Prescription - 01/30/22 1300       Date of Initial Exercise RX and Referring Provider   Date 01/30/22    Referring Provider Croitoru, Mihai MD      Oxygen   Maintain Oxygen Saturation 88% or higher      Treadmill   MPH 2.1    Grade 1    Minutes 15    METs 2.9      Recumbant Bike   Level 2    RPM 50    Watts 19    Minutes 15    METs 2.97      NuStep   Level 2    SPM 80    Minutes 15    METs 2.97      Prescription Details   Frequency (times per week) 3    Duration Progress to 30 minutes of continuous aerobic without signs/symptoms of physical distress      Intensity   THRR 40-80% of Max Heartrate 95-128    Ratings of Perceived Exertion 11-13    Perceived Dyspnea 0-4      Progression   Progression Continue to progress workloads to maintain intensity without signs/symptoms of physical distress.      Resistance Training   Training Prescription Yes    Weight 3 lb    Reps 10-15             Discharge Exercise Prescription (Final Exercise Prescription Changes):  Exercise Prescription Changes - 03/25/22 1400       Response to Exercise   Blood Pressure (Admit) 118/68    Blood Pressure (Exit) 102/52    Heart Rate (Admit) 75 bpm    Heart Rate (Exercise) 116 bpm    Heart Rate (Exit) 84 bpm     Rating of Perceived Exertion (Exercise) 12    Symptoms none    Duration Continue with 30 min of aerobic exercise without signs/symptoms of physical distress.    Intensity THRR unchanged      Progression   Progression Continue to progress workloads to maintain intensity without signs/symptoms of physical distress.    Average METs 3.6      Resistance Training   Training Prescription Yes    Weight 4 lb    Reps 10-15      Interval Training   Interval Training No      Treadmill   MPH 2.8    Grade 3    Minutes 15    METs 4.3      Recumbant Bike   Level 3    Watts 19    Minutes 15    METs 4      NuStep   Level 5    Minutes 15    METs 3.5      Recumbant Elliptical   Level 2    Minutes 15  METs 1.8      Biostep-RELP   Level 3    Minutes 15    METs 3      Home Exercise Plan   Plans to continue exercise at Home (comment)   walking, [facility]   Frequency Add 2 additional days to program exercise sessions.    Initial Home Exercises Provided 02/18/22      Oxygen   Maintain Oxygen Saturation 88% or higher             Functional Capacity:  6 Minute Walk     Row Name 01/30/22 1332 04/03/22 1126       6 Minute Walk   Phase Initial Discharge    Distance 1210 feet 1520 feet    Distance % Change -- 25.6 %    Distance Feet Change -- 310 ft    Walk Time 6 minutes 6 minutes    # of Rest Breaks 0 0    MPH 2.29 2.87    METS 2.97 3.65    RPE 12 11    Perceived Dyspnea  0 0    VO2 Peak 10.39 12.79    Symptoms Yes (comment) Yes (comment)    Comments Calf tightness Calf tightness    Resting HR 63 bpm 77 bpm    Resting BP 140/66 130/62    Resting Oxygen Saturation  99 % 95 %    Exercise Oxygen Saturation  during 6 min walk 98 % 94 %    Max Ex. HR 104 bpm 120 bpm    Max Ex. BP 168/74 162/64    2 Minute Post BP 150/70 144/66               Nutrition & Weight - Outcomes:  Pre Biometrics - 01/30/22 1334       Pre Biometrics   Height 5' 4.25"  (1.632 m)    Weight 136 lb 8 oz (61.9 kg)    Waist Circumference 37 inches    Hip Circumference 37.5 inches    Waist to Hip Ratio 0.99 %    BMI (Calculated) 23.25    Single Leg Stand 11.8 seconds   L            Post Biometrics - 04/03/22 1126        Post  Biometrics   Height 5' 4.25" (1.632 m)    Weight 136 lb 8 oz (61.9 kg)    Waist Circumference 36.75 inches    Hip Circumference 36.5 inches    Waist to Hip Ratio 1.01 %    BMI (Calculated) 23.25    Single Leg Stand 10.1 seconds              Goals reviewed with patient; copy given to patient.

## 2022-04-08 ENCOUNTER — Encounter: Payer: BC Managed Care – PPO | Admitting: *Deleted

## 2022-04-08 DIAGNOSIS — Z952 Presence of prosthetic heart valve: Secondary | ICD-10-CM

## 2022-04-08 NOTE — Progress Notes (Signed)
Daily Session Note  Patient Details  Name: BLANCHIE CROWE MRN: [id number] Date of Birth: [birth year] Referring Provider:   Flowsheet Row Cardiac Rehab from 01/30/2022 in [facility] Cardiac and Pulmonary Rehab  Referring Provider [name] MD       Encounter Date: 04/08/2022  Check In:  Session Check In - 04/08/22 1148       Check-In   Supervising physician immediately available to respond to emergencies See telemetry face sheet for immediately available ER MD    Location [facility]-Cardiac & Pulmonary Rehab    Staff Present Heath Lark, RN, BSN, CCRP;Jessica Lansing, MA, RCEP, CCRP, Bertram Gala, MS, ACSM CEP, Exercise Physiologist    Virtual Visit No    Medication changes reported     No    Fall or balance concerns reported    No    Warm-up and Cool-down Performed on first and last piece of equipment    Resistance Training Performed Yes    VAD Patient? No    PAD/SET Patient? No      Pain Assessment   Currently in Pain? No/denies                Social History   Tobacco Use  Smoking Status Former   Years: 45   Types: Cigarettes  Smokeless Tobacco Never  Tobacco Comments   maybe 5-10 cigarettes a day  starts and stops smoking every few days    Goals Met:  Independence with exercise equipment Exercise tolerated well No report of concerns or symptoms today  Goals Unmet:  Not Applicable  Comments: Pt able to follow exercise prescription today without complaint.  Will continue to monitor for progression.    Dr. Emily Filbert is Medical Director for [place].  Dr. Ottie Glazier is Medical Director for [facility] Pulmonary Rehabilitation.

## 2022-04-10 ENCOUNTER — Encounter: Payer: BC Managed Care – PPO | Admitting: *Deleted

## 2022-04-10 DIAGNOSIS — Z952 Presence of prosthetic heart valve: Secondary | ICD-10-CM

## 2022-04-10 NOTE — Progress Notes (Signed)
Daily Session Note  Patient Details  Name: Mandy Delacruz MRN: [id number] Date of Birth: 05-21-1945 Referring Provider:   Flowsheet Row Cardiac Rehab from 01/30/2022 in [facility] Cardiac and Pulmonary Rehab  Referring Provider [name] MD       Encounter Date: 04/10/2022  Check In:  Session Check In - 04/10/22 1057       Check-In   Supervising physician immediately available to respond to emergencies See telemetry face sheet for immediately available ER MD    Location [facility]-Cardiac & Pulmonary Rehab    Staff Present Darlyne Russian, RN, ADN;Meredith Sherryll Burger, RN Abel Presto, MS, ACSM CEP, Exercise Physiologist;Joseph Tessie Fass, [place]    Virtual Visit No    Medication changes reported     No    Fall or balance concerns reported    No    Warm-up and Cool-down Performed on first and last piece of equipment    Resistance Training Performed Yes    VAD Patient? No    PAD/SET Patient? No      Pain Assessment   Currently in Pain? No/denies                Social History   Tobacco Use  Smoking Status Former   Years: 45   Types: Cigarettes  Smokeless Tobacco Never  Tobacco Comments   maybe 5-10 cigarettes a day  starts and stops smoking every few days    Goals Met:  Independence with exercise equipment Exercise tolerated well No report of concerns or symptoms today Strength training completed today  Goals Unmet:  Not Applicable  Comments: Pt able to follow exercise prescription today without complaint.  Will continue to monitor for progression.    Dr. Emily Filbert is Medical Director for [place].  Dr. Ottie Glazier is Medical Director for [facility] Pulmonary Rehabilitation.

## 2022-04-17 ENCOUNTER — Encounter: Payer: BC Managed Care – PPO | Admitting: *Deleted

## 2022-04-18 ENCOUNTER — Encounter: Payer: BC Managed Care – PPO | Attending: Cardiovascular Disease

## 2022-04-18 DIAGNOSIS — Z952 Presence of prosthetic heart valve: Secondary | ICD-10-CM | POA: Insufficient documentation

## 2022-04-18 NOTE — Progress Notes (Signed)
Daily Session Note  Patient Details  Name: RAHINI STIFTER MRN: [id number] Date of Birth: May 19, 1945 Referring Provider:   Flowsheet Row Cardiac Rehab from 01/30/2022 in [facility] Cardiac and Pulmonary Rehab  Referring Provider Thurmon Fair MD       Encounter Date: 04/18/2022  Check In:  Session Check In - 04/18/22 1110       Check-In   Supervising physician immediately available to respond to emergencies See telemetry face sheet for immediately available ER MD    Location [facility]-Cardiac & Pulmonary Rehab    Staff Present Darcel Bayley, RN,BC,MSN;Joseph [place], RCP,RRT,BSRT;Noah [place], [place], Exercise Physiologist    Virtual Visit No    Medication changes reported     No    Fall or balance concerns reported    No    Tobacco Cessation No Change    Warm-up and Cool-down Performed on first and last piece of equipment    Resistance Training Performed Yes    VAD Patient? No    PAD/SET Patient? No      Pain Assessment   Currently in Pain? No/denies    Multiple Pain Sites No                Social History   Tobacco Use  Smoking Status Former   Years: 45   Types: Cigarettes  Smokeless Tobacco Never  Tobacco Comments   maybe 5-10 cigarettes a day  starts and stops smoking every few days    Goals Met:  Independence with exercise equipment Exercise tolerated well No report of concerns or symptoms today  Goals Unmet:  Not Applicable  Comments: Pt able to follow exercise prescription today without complaint.  Will continue to monitor for progression.    Dr. Bethann Punches is Medical Director for [facility] Cardiac Rehabilitation.  Dr. Vida Rigger is Medical Director for [facility] Pulmonary Rehabilitation.

## 2022-04-22 ENCOUNTER — Encounter: Payer: BC Managed Care – PPO | Admitting: *Deleted

## 2022-04-22 DIAGNOSIS — Z952 Presence of prosthetic heart valve: Secondary | ICD-10-CM | POA: Diagnosis not present

## 2022-04-22 NOTE — Progress Notes (Signed)
Discharge Summary: Mandy Delacruz [birth year]   [name] graduated today from  rehab with 36 sessions completed.  Details of the patient's exercise prescription and what she needs to do in order to continue the prescription and progress were discussed with patient.  Patient was given a copy of prescription and goals.  Patient verbalized understanding. Pam plans to continue to exercise by walking.   6 Minute Walk     Row Name 01/30/22 1332 04/03/22 1126       6 Minute Walk   Phase Initial Discharge    Distance 1210 feet 1520 feet    Distance % Change -- 25.6 %    Distance Feet Change -- 310 ft    Walk Time 6 minutes 6 minutes    # of Rest Breaks 0 0    MPH 2.29 2.87    METS 2.97 3.65    RPE 12 11    Perceived Dyspnea  0 0    VO2 Peak 10.39 12.79    Symptoms Yes (comment) Yes (comment)    Comments Calf tightness Calf tightness    Resting HR 63 bpm 77 bpm    Resting BP 140/66 130/62    Resting Oxygen Saturation  99 % 95 %    Exercise Oxygen Saturation  during 6 min walk 98 % 94 %    Max Ex. HR 104 bpm 120 bpm    Max Ex. BP 168/74 162/64    2 Minute Post BP 150/70 144/66

## 2022-04-22 NOTE — Progress Notes (Signed)
Daily Session Note  Patient Details  Name: ELVERTA SHALA MRN: [id number] Date of Birth: 06-14-1945 Referring Provider:   Flowsheet Row Cardiac Rehab from 01/30/2022 in [facility] Cardiac and Pulmonary Rehab  Referring Provider Thurmon Fair MD       Encounter Date: 04/22/2022  Check In:      Social History   Tobacco Use  Smoking Status Former   Years: 46   Types: Cigarettes  Smokeless Tobacco Never  Tobacco Comments   maybe 5-10 cigarettes a day  starts and stops smoking every few days    Goals Met:  Independence with exercise equipment Exercise tolerated well No report of concerns or symptoms today  Goals Unmet:  Not Applicable  Comments: Pt able to follow exercise prescription today without complaint.  Will continue to monitor for progression.    Dr. Bethann Punches is Medical Director for [facility] Cardiac Rehabilitation.  Dr. Vida Rigger is Medical Director for [facility] Pulmonary Rehabilitation.

## 2022-04-22 NOTE — Progress Notes (Signed)
Cardiac Individual Treatment Plan  Patient Details  Name: Mandy Delacruz MRN: [id number] Date of Birth: [birth year] Referring Provider:   Flowsheet Row Cardiac Rehab from 01/30/2022 in [facility] Cardiac and Pulmonary Rehab  Referring Provider [name] MD       Initial Encounter Date:  Flowsheet Row Cardiac Rehab from 01/30/2022 in [facility] Cardiac and Pulmonary Rehab  Date 01/30/22       Visit Diagnosis: S/P TAVR (transcatheter aortic valve replacement)  Patient's Home Medications on Admission:  Current Outpatient Medications:    ACCU-CHEK GUIDE test strip, 2 (two) times daily. as directed, Disp: , Rfl:    acetaminophen (TYLENOL) 650 MG CR tablet, Take 1,300 mg by mouth every 8 (eight) hours as needed for pain., Disp: , Rfl:    albuterol (VENTOLIN HFA) 108 (90 Base) MCG/ACT inhaler, Inhale 2 puffs into the lungs every 6 (six) hours as needed for wheezing or shortness of breath., Disp: , Rfl:    amoxicillin (AMOXIL) 500 MG tablet, Take 4 tablets (2,000 mg total) by mouth as directed. 1 hour prior to dental work including cleanings, Disp: 12 tablet, Rfl: 12   aspirin 81 MG chewable tablet, Chew 1 tablet (81 mg total) by mouth daily. Okay to swallow whole., Disp: , Rfl:    diphenhydrAMINE (BENADRYL) 25 MG tablet, Take 25 mg by mouth at bedtime., Disp: , Rfl:    ferrous sulfate 325 (65 FE) MG tablet, Take 1 tablet (325 mg total) by mouth daily., Disp: 30 tablet, Rfl: 3   fexofenadine (ALLEGRA) 180 MG tablet, Take 180 mg by mouth daily as needed for allergies or rhinitis., Disp: , Rfl:    losartan-hydrochlorothiazide (HYZAAR) 50-12.5 MG tablet, Take 1 tablet by mouth daily. (Patient not taking: Reported on 02/24/2022), Disp: 90 tablet, Rfl: 3   losartan-hydrochlorothiazide (HYZAAR) 50-12.5 MG tablet, Take 2 tablets by mouth daily., Disp: , Rfl:    pantoprazole (PROTONIX) 40 MG tablet, Take 1 tablet (40 mg total) by mouth daily. (Patient taking differently: Take 40 mg by mouth at bedtime.),  Disp: 30 tablet, Rfl: 2   pravastatin (PRAVACHOL) 40 MG tablet, TAKE 1 TABLET(40 MG) BY MOUTH EVERY EVENING, Disp: 90 tablet, Rfl: 0   SYMBICORT 80-4.5 MCG/ACT inhaler, Take 2 puffs by mouth 2 (two) times daily., Disp: , Rfl: 11  Past Medical History: Past Medical History:  Diagnosis Date   Anemia    Asthma    Bronchitis    on bactrin since 12-27-14   Colon polyps 2001   Diverticulosis of colon (without mention of hemorrhage) 2001   Dizziness    inner ear problem at times, saw dr  [name] for   GERD (gastroesophageal reflux disease)    GI bleed    Hiatal hernia 2001   Hyperlipidemia    Hypertension    IH (inguinal hernia)    left   Lung nodule    Other esophagitis 2001   Severe aortic stenosis    Tobacco abuse     Tobacco Use: Social History   Tobacco Use  Smoking Status Former   Years: 45   Types: Cigarettes  Smokeless Tobacco Never  Tobacco Comments   maybe 5-10 cigarettes a day  starts and stops smoking every few days    Labs: Review Flowsheet       Latest Ref Rng & Units 10/25/2021 12/19/2021 01/14/2022  Labs for ITP Cardiac and Pulmonary Rehab  Hemoglobin A1c 4.8 - 5.6 % 6.0  - -  PH, Arterial 7.35 - 7.45 - 7.391  -  PCO2 arterial 32 - 48 mmHg - 34.7  -  Bicarbonate 20.0 - 28.0 mmol/L - 25.0  21.1  -  TCO2 22 - 32 mmol/L - 26  22  22  24    Acid-base deficit 0.0 - 2.0 mmol/L - 1.0  3.0  -  O2 Saturation % - 67  94  -     Exercise Target Goals: Exercise Program Goal: Individual exercise prescription set using results from initial 6 min walk test and THRR while considering  patient's activity barriers and safety.   Exercise Prescription Goal: Initial exercise prescription builds to 30-45 minutes a day of aerobic activity, 2-3 days per week.  Home exercise guidelines will be given to patient during program as part of exercise prescription that the participant will acknowledge.   Education: Aerobic Exercise: - Group verbal and visual presentation  on the components of exercise prescription. Introduces F.I.T.T principle from ACSM for exercise prescriptions.  Reviews F.I.T.T. principles of aerobic exercise including progression. Written material given at graduation. Flowsheet Row Cardiac Rehab from 04/10/2022 in [facility] Cardiac and Pulmonary Rehab  Date 04/10/22  Educator [facility]  Instruction Review Code 1- Verbalizes Understanding       Education: Resistance Exercise: - Group verbal and visual presentation on the components of exercise prescription. Introduces F.I.T.T principle from ACSM for exercise prescriptions  Reviews F.I.T.T. principles of resistance exercise including progression. Written material given at graduation.    Education: Exercise & Equipment Safety: - Individual verbal instruction and demonstration of equipment use and safety with use of the equipment. Flowsheet Row Cardiac Rehab from 04/10/2022 in [facility] Cardiac and Pulmonary Rehab  Date 01/30/22  Educator NT  Instruction Review Code 1- Verbalizes Understanding       Education: Exercise Physiology & General Exercise Guidelines: - Group verbal and written instruction with models to review the exercise physiology of the cardiovascular system and associated critical values. Provides general exercise guidelines with specific guidelines to those with heart or lung disease.  Flowsheet Row Cardiac Rehab from 04/10/2022 in [facility] Cardiac and Pulmonary Rehab  Date 04/03/22  Educator [facility]  Instruction Review Code 1- Verbalizes Understanding       Education: Flexibility, Balance, Mind/Body Relaxation: - Group verbal and visual presentation with interactive activity on the components of exercise prescription. Introduces F.I.T.T principle from ACSM for exercise prescriptions. Reviews F.I.T.T. principles of flexibility and balance exercise training including progression. Also discusses the mind body connection.  Reviews various relaxation techniques to help reduce and manage stress (i.e.  Deep breathing, progressive muscle relaxation, and visualization). Balance handout provided to take home. Written material given at graduation. Flowsheet Row Cardiac Rehab from 04/10/2022 in [facility] Cardiac and Pulmonary Rehab  Date 02/13/22  Educator [facility]  Instruction Review Code 1- Verbalizes Understanding       Activity Barriers & Risk Stratification:  Activity Barriers & Cardiac Risk Stratification - 01/30/22 1333       Activity Barriers & Cardiac Risk Stratification   Activity Barriers Arthritis;Back Problems;Joint Problems;Muscular Weakness   left foot numbness   Cardiac Risk Stratification Low             6 Minute Walk:  6 Minute Walk     Row Name 01/30/22 1332 04/03/22 1126       6 Minute Walk   Phase Initial Discharge    Distance 1210 feet 1520 feet    Distance % Change -- 25.6 %    Distance Feet Change -- 310 ft    Walk Time 6 minutes  6 minutes    # of Rest Breaks 0 0    MPH 2.29 2.87    METS 2.97 3.65    RPE 12 11    Perceived Dyspnea  0 0    VO2 Peak 10.39 12.79    Symptoms Yes (comment) Yes (comment)    Comments Calf tightness Calf tightness    Resting HR 63 bpm 77 bpm    Resting BP 140/66 130/62    Resting Oxygen Saturation  99 % 95 %    Exercise Oxygen Saturation  during 6 min walk 98 % 94 %    Max Ex. HR 104 bpm 120 bpm    Max Ex. BP 168/74 162/64    2 Minute Post BP 150/70 144/66             Oxygen Initial Assessment:   Oxygen Re-Evaluation:   Oxygen Discharge (Final Oxygen Re-Evaluation):   Initial Exercise Prescription:  Initial Exercise Prescription - 01/30/22 1300       Date of Initial Exercise RX and Referring Provider   Date 01/30/22    Referring Provider Croitoru, Mihai MD      Oxygen   Maintain Oxygen Saturation 88% or higher      Treadmill   MPH 2.1    Grade 1    Minutes 15    METs 2.9      Recumbant Bike   Level 2    RPM 50    Watts 19    Minutes 15    METs 2.97      NuStep   Level 2    SPM 80    Minutes  15    METs 2.97      Prescription Details   Frequency (times per week) 3    Duration Progress to 30 minutes of continuous aerobic without signs/symptoms of physical distress      Intensity   THRR 40-80% of Max Heartrate 95-128    Ratings of Perceived Exertion 11-13    Perceived Dyspnea 0-4      Progression   Progression Continue to progress workloads to maintain intensity without signs/symptoms of physical distress.      Resistance Training   Training Prescription Yes    Weight 3 lb    Reps 10-15             Perform Capillary Blood Glucose checks as needed.  Exercise Prescription Changes:   Exercise Prescription Changes     Row Name 01/30/22 1300 02/18/22 1100 02/27/22 0800 03/13/22 1500 03/25/22 1400     Response to Exercise   Blood Pressure (Admit) 140/66 -- 122/64 122/64 118/68   Blood Pressure (Exercise) 168/74 -- 124/60 148/64 --   Blood Pressure (Exit) 150/70 -- 124/62 122/60 102/52   Heart Rate (Admit) 63 bpm -- 57 bpm 86 bpm 75 bpm   Heart Rate (Exercise) 104 bpm -- 106 bpm 121 bpm 116 bpm   Heart Rate (Exit) 76 bpm -- 88 bpm 92 bpm 84 bpm   Oxygen Saturation (Admit) 99 % -- -- -- --   Oxygen Saturation (Exercise) 98 % -- -- -- --   Oxygen Saturation (Exit) 98 % -- -- -- --   Rating of Perceived Exertion (Exercise) 12 -- 13 13 12    Perceived Dyspnea (Exercise) 0 -- -- -- --   Symptoms Calf tightness -- none none none   Comments Results -- 2nd full week of exercise -- --   Duration -- -- Continue with 30 min  of aerobic exercise without signs/symptoms of physical distress. Continue with 30 min of aerobic exercise without signs/symptoms of physical distress. Continue with 30 min of aerobic exercise without signs/symptoms of physical distress.   Intensity -- -- THRR unchanged THRR unchanged THRR unchanged     Progression   Progression -- -- Continue to progress workloads to maintain intensity without signs/symptoms of physical distress. Continue to  progress workloads to maintain intensity without signs/symptoms of physical distress. Continue to progress workloads to maintain intensity without signs/symptoms of physical distress.   Average METs -- -- 2.67 3.04 3.6     Resistance Training   Training Prescription -- -- Yes Yes Yes   Weight -- -- 3 lb 3 lb 4 lb   Reps -- -- 10-15 10-15 10-15     Interval Training   Interval Training -- -- No No No     Treadmill   MPH -- -- 2.1 2.5 2.8   Grade -- -- 1 2 3    Minutes -- -- 15 15 15    METs -- -- 2.9 3.6 4.3     Recumbant Bike   Level -- -- 1 2 3    Watts -- -- 19 19 19    Minutes -- -- 15 15 15    METs -- -- 2.98 2.2 4     NuStep   Level -- -- 4 6 5    Minutes -- -- 15 15 15    METs -- -- -- 3.2 3.5     Recumbant Elliptical   Level -- -- -- -- 2   Minutes -- -- -- -- 15   METs -- -- -- -- 1.8     Biostep-RELP   Level -- -- -- -- 3   Minutes -- -- -- -- 15   METs -- -- -- -- 3     Home Exercise Plan   Plans to continue exercise at -- Home (comment)  walking, [facility] (comment)  walking, [facility] (comment)  walking, [facility] (comment)  walking, [facility]   Frequency -- Add 2 additional days to program exercise sessions. Add 2 additional days to program exercise sessions. Add 2 additional days to program exercise sessions. Add 2 additional days to program exercise sessions.   Initial Home Exercises Provided -- 02/18/22 02/18/22 02/18/22 02/18/22     Oxygen   Maintain Oxygen Saturation -- -- 88% or higher 88% or higher 88% or higher    Row Name 04/10/22 1400             Response to Exercise   Blood Pressure (Admit) 130/62       Blood Pressure (Exit) 108/62       Heart Rate (Admit) 77 bpm       Heart Rate (Exercise) 120 bpm       Heart Rate (Exit) 86 bpm       Rating of Perceived Exertion (Exercise) 13       Symptoms none       Duration Continue with 30 min of aerobic exercise without signs/symptoms of physical  distress.       Intensity THRR unchanged         Progression   Progression Continue to progress workloads to maintain intensity without signs/symptoms of physical distress.       Average METs 3.76         Resistance Training   Training Prescription Yes       Weight 4 lb  Reps 10-15         Interval Training   Interval Training No         Treadmill   MPH 2.8       Grade 3       Minutes 15       METs 4.3         NuStep   Level 5       Minutes 15       METs 3.4         Recumbant Elliptical   Level 2.8       Minutes 15       METs 2.1         REL-XR   Level 6       Minutes 15         Home Exercise Plan   Plans to continue exercise at Home (comment)  walking, [facility]       Frequency Add 2 additional days to program exercise sessions.       Initial Home Exercises Provided 02/18/22         Oxygen   Maintain Oxygen Saturation 88% or higher                Exercise Comments:   Exercise Comments     Row Name 02/13/22 1120           Exercise Comments First full day of exercise!  Patient was oriented to gym and equipment including functions, settings, policies, and procedures.  Patient's individual exercise prescription and treatment plan were reviewed.  All starting workloads were established based on the results of the 6 minute walk test done at initial orientation visit.  The plan for exercise progression was also introduced and progression will be customized based on patient's performance and goals.                Exercise Goals and Review:   Exercise Goals     Row Name 01/30/22 1307             Exercise Goals   Increase Physical Activity Yes       Intervention Provide advice, education, support and counseling about physical activity/exercise needs.;Develop an individualized exercise prescription for aerobic and resistive training based on initial evaluation findings, risk stratification, comorbidities and participant's personal  goals.       Expected Outcomes Short Term: Attend rehab on a regular basis to increase amount of physical activity.;Long Term: Add in home exercise to make exercise part of routine and to increase amount of physical activity.;Long Term: Exercising regularly at least 3-5 days a week.       Increase Strength and Stamina Yes       Intervention Provide advice, education, support and counseling about physical activity/exercise needs.;Develop an individualized exercise prescription for aerobic and resistive training based on initial evaluation findings, risk stratification, comorbidities and participant's personal goals.       Expected Outcomes Short Term: Increase workloads from initial exercise prescription for resistance, speed, and METs.;Short Term: Perform resistance training exercises routinely during rehab and add in resistance training at home;Long Term: Improve cardiorespiratory fitness, muscular endurance and strength as measured by increased METs and functional capacity ( )       Able to understand and use rate of perceived exertion (RPE) scale Yes       Intervention Provide education and explanation on how to use RPE scale       Expected Outcomes Short Term: Able  to use RPE daily in rehab to express subjective intensity level;Long Term:  Able to use RPE to guide intensity level when exercising independently       Able to understand and use Dyspnea scale Yes       Intervention Provide education and explanation on how to use Dyspnea scale       Expected Outcomes Short Term: Able to use Dyspnea scale daily in rehab to express subjective sense of shortness of breath during exertion;Long Term: Able to use Dyspnea scale to guide intensity level when exercising independently       Knowledge and understanding of Target Heart Rate Range (THRR) Yes       Intervention Provide education and explanation of THRR including how the numbers were predicted and where they are located for reference       Expected  Outcomes Short Term: Able to state/look up THRR;Long Term: Able to use THRR to govern intensity when exercising independently;Short Term: Able to use daily as guideline for intensity in rehab       Able to check pulse independently Yes       Intervention Provide education and demonstration on how to check pulse in carotid and radial arteries.;Review the importance of being able to check your own pulse for safety during independent exercise       Expected Outcomes Short Term: Able to explain why pulse checking is important during independent exercise;Long Term: Able to check pulse independently and accurately       Understanding of Exercise Prescription Yes       Intervention Provide education, explanation, and written materials on patient's individual exercise prescription       Expected Outcomes Short Term: Able to explain program exercise prescription;Long Term: Able to explain home exercise prescription to exercise independently                Exercise Goals Re-Evaluation :  Exercise Goals Re-Evaluation     Row Name 02/13/22 1120 02/18/22 1126 02/27/22 0826 03/11/22 1128 03/13/22 1558     Exercise Goal Re-Evaluation   Exercise Goals Review Able to understand and use rate of perceived exertion (RPE) scale;Able to understand and use Dyspnea scale;Knowledge and understanding of Target Heart Rate Range (THRR);Understanding of Exercise Prescription Able to understand and use rate of perceived exertion (RPE) scale;Able to understand and use Dyspnea scale;Knowledge and understanding of Target Heart Rate Range (THRR);Understanding of Exercise Prescription;Increase Strength and Stamina;Increase Physical Activity Increase Physical Activity;Increase Strength and Stamina;Understanding of Exercise Prescription Increase Physical Activity;Increase Strength and Stamina;Understanding of Exercise Prescription Increase Physical Activity;Increase Strength and Stamina;Understanding of Exercise Prescription    Comments Reviewed RPE scale, THR and program prescription with pt today.  Pt voiced understanding and was given a copy of goals to take home. Reviewed home exercise with pt today.  Pt plans to walking and go to [facility]) for exercise.  Reviewed THR, pulse, RPE, sign and symptoms, pulse oximetery and when to call 911 or MD.  Also discussed weather considerations and indoor options.  Pt voiced understanding.    She has noted that she has had some numbness and pain with walking.  She has another follow up on Monday for her murmur and legs.  Pam is also wanting to work on her balance some. Pam is doing well for her first couple of weeks in rehab. She has already been able to increase her T4 Nustep to level 4. Other than that, she has been following her inital exercise prescription well.  RPEs are staying in appropriate range and she hits her THR most sessions. Will continue to monitor her progression. Pam is doing well in rehab.  She has started to go to the [facility] now twice a week.  She made her goal to exercise 5 days a week.  She will go and use the machines and lift some weights as well.  She is feeling stronger and better overall. Pam continues to do well in rehab. She recently increased her overall average MET level to 3.04 METs. She also improved to level 2 on the recumbent bike and level 6 on the T4 nustep. She increased her treadmill workload as well, to a speed of 2.5 mph with an incline of 2%. We will continue to monitor her progress in the program.   Expected Outcomes Short: Use RPE daily to regulate intensity. Long: Follow program prescription in THR. Short: Talk to doctor about legs and start to add in exercise Long; continue to improve stamina Short: Start to increase treadmill workload, speed Long: Increase overall stamina and strength Short: Continue to go to [facility] on off days Long: Continue to improve stamina Short: Continue to progressively increase treadmill workload. Long: Continue to improve  strength and stamina.    Row Name 03/25/22 1453 04/08/22 1109 04/10/22 1436         Exercise Goal Re-Evaluation   Exercise Goals Review Increase Physical Activity;Increase Strength and Stamina;Understanding of Exercise Prescription Increase Physical Activity;Increase Strength and Stamina;Understanding of Exercise Prescription Increase Physical Activity;Increase Strength and Stamina;Understanding of Exercise Prescription     Comments Pam continues to do well in rehab. She started using 4 lb for handweights! She also increased her treadmill speed to a 2.8 mph with a 3% incline. She tried out the Biostep at level 3 for the first time and did well. Will continue to monitor. Pam is doing well in rehab.  She is already going to [facility] two days a week for about an hour and doing the same routine.  She has found the treadmill to be boring and wants to set up her phone to use podcast and/or music for her to do.  She is planning to go to 5 days a week after graduation..  She improved her post by over 300 ft!! Pam is doing well in the program and is close to graduating. She recently completed her post where she improved by 25.6%! She also increased her overall average MET level 3.76 METs. She was able to improve to level 2.8 on the recumbent elliptical and level 6 on the XR as well. We will continue to monitor her progress until she graduates from the program.     Expected Outcomes Short: continue to increase workload on treadmill Long: Continue to increase overall MET level and stamina Short: Continue to exercise on off days Long: continue to exercise independently Short: Graduate. Long: Continue to exercise independently.              Discharge Exercise Prescription (Final Exercise Prescription Changes):  Exercise Prescription Changes - 04/10/22 1400       Response to Exercise   Blood Pressure (Admit) 130/62    Blood Pressure (Exit) 108/62    Heart Rate (Admit) 77 bpm    Heart Rate (Exercise)  120 bpm    Heart Rate (Exit) 86 bpm    Rating of Perceived Exertion (Exercise) 13    Symptoms none    Duration Continue with 30 min of aerobic exercise without signs/symptoms of physical  distress.    Intensity THRR unchanged      Progression   Progression Continue to progress workloads to maintain intensity without signs/symptoms of physical distress.    Average METs 3.76      Resistance Training   Training Prescription Yes    Weight 4 lb    Reps 10-15      Interval Training   Interval Training No      Treadmill   MPH 2.8    Grade 3    Minutes 15    METs 4.3      NuStep   Level 5    Minutes 15    METs 3.4      Recumbant Elliptical   Level 2.8    Minutes 15    METs 2.1      REL-XR   Level 6    Minutes 15      Home Exercise Plan   Plans to continue exercise at Home (comment)   walking, [facility]   Frequency Add 2 additional days to program exercise sessions.    Initial Home Exercises Provided 02/18/22      Oxygen   Maintain Oxygen Saturation 88% or higher             Nutrition:  Target Goals: Understanding of nutrition guidelines, daily intake of sodium 1500mg , cholesterol 200mg , calories 30% from fat and 7% or less from saturated fats, daily to have 5 or more servings of fruits and vegetables.  Education: All About Nutrition: -Group instruction provided by verbal, written material, interactive activities, discussions, models, and posters to present general guidelines for heart healthy nutrition including fat, fiber, MyPlate, the role of sodium in heart healthy nutrition, utilization of the nutrition label, and utilization of this knowledge for meal planning. Follow up email sent as well. Written material given at graduation. Flowsheet Row Cardiac Rehab from 04/10/2022 in [facility] Cardiac and Pulmonary Rehab  Education need identified 01/30/22       Biometrics:  Pre Biometrics - 01/30/22 1334       Pre Biometrics   Height 5' 4.25" (1.632 m)     Weight 136 lb 8 oz (61.9 kg)    Waist Circumference 37 inches    Hip Circumference 37.5 inches    Waist to Hip Ratio 0.99 %    BMI (Calculated) 23.25    Single Leg Stand 11.8 seconds   L            Post Biometrics - 04/03/22 1126        Post  Biometrics   Height 5' 4.25" (1.632 m)    Weight 136 lb 8 oz (61.9 kg)    Waist Circumference 36.75 inches    Hip Circumference 36.5 inches    Waist to Hip Ratio 1.01 %    BMI (Calculated) 23.25    Single Leg Stand 10.1 seconds             Nutrition Therapy Plan and Nutrition Goals:  Nutrition Therapy & Goals - 02/18/22 1131       Nutrition Therapy   RD appointment deferred Yes             Nutrition Assessments:  MEDIFICTS Score Key: ?70 Need to make dietary changes  40-70 Heart Healthy Diet ? 40 Therapeutic Level Cholesterol Diet  Flowsheet Row Cardiac Rehab from 04/18/2022 in [facility] Cardiac and Pulmonary Rehab  Picture Your Plate Total Score on Discharge 52      Picture Your Plate Scores: <40  Unhealthy dietary pattern with much room for improvement. 41-50 Dietary pattern unlikely to meet recommendations for good health and room for improvement. 51-60 More healthful dietary pattern, with some room for improvement.  >60 Healthy dietary pattern, although there may be some specific behaviors that could be improved.    Nutrition Goals Re-Evaluation:  Nutrition Goals Re-Evaluation     Row Name 02/18/22 1131 03/11/22 1125 04/08/22 1117         Goals   Nutrition Goal Defer Nutrition Appt  Work on weight loss Short: Continue to cook at home and watch sodium. Long: Conitnue to reduce sodium Short: Continue to cut back on sodium Long: Continue to focus on heart healthy diet     Comment Pam feel sthat she does not need to meet with dietitian as she feels like she knows what she is supposed to eat. She is trying to eat lots of fruits and vegetables.  She is sticking to the lean proteins.  She had beef stew last night for  dinner, that she made herself.  Her husband loves beans so they get a lot of those in too.  She enjoys cooking at home.  She has always used salt, but is now trying to swtich over to more pepper as seasoning to reduce salt.  She is becoming more aware of it now. Pam is doing well in rehab.  She has tried out a new recipe for a yogurt cake but forgot to add the sweetner and it did not taste very good.  She was proud of herself for trying something new.  She is still getting in a good variety.  She continues to get away from salt, but it is still her weakness. Pam continues to work on her diet.  She continues to work on her sodium intake.  She is getting in a good vareity     Expected Outcome Short: Continue to cook at home and watch sodium. Long: Conitnue to reduce sodium Short: Continue to cut back on sodium Long: Continue to focus on heart healthy diet Continue to watch sodium and focus on heart healthy              Nutrition Goals Discharge (Final Nutrition Goals Re-Evaluation):  Nutrition Goals Re-Evaluation - 04/08/22 1117       Goals   Nutrition Goal Short: Continue to cut back on sodium Long: Continue to focus on heart healthy diet    Comment Pam continues to work on her diet.  She continues to work on her sodium intake.  She is getting in a good vareity    Expected Outcome Continue to watch sodium and focus on heart healthy             Psychosocial: Target Goals: Acknowledge presence or absence of significant depression and/or stress, maximize coping skills, provide positive support system. Participant is able to verbalize types and ability to use techniques and skills needed for reducing stress and depression.   Education: Stress, Anxiety, and Depression - Group verbal and visual presentation to define topics covered.  Reviews how body is impacted by stress, anxiety, and depression.  Also discusses healthy ways to reduce stress and to treat/manage anxiety and depression.  Written  material given at graduation. Flowsheet Row Cardiac Rehab from 04/10/2022 in [facility] Cardiac and Pulmonary Rehab  Date 03/27/22  Educator [facility]  Instruction Review Code 1- [employer] Understanding       Education: Sleep Hygiene -Provides group verbal and written instruction about how sleep can  affect your health.  Define sleep hygiene, discuss sleep cycles and impact of sleep habits. Review good sleep hygiene tips.    Initial Review & Psychosocial Screening:  Initial Psych Review & Screening - 01/29/22 1016       Initial Review   Current issues with None Identified      Family Dynamics   Good Support System? Yes   husband, children near by     Barriers   Psychosocial barriers to participate in program There are no identifiable barriers or psychosocial needs.      Screening Interventions   Interventions Encouraged to exercise;To provide support and resources with identified psychosocial needs;Provide feedback about the scores to participant    Expected Outcomes Short Term goal: Utilizing psychosocial counselor, staff and physician to assist with identification of specific Stressors or current issues interfering with healing process. Setting desired goal for each stressor or current issue identified.;Long Term Goal: Stressors or current issues are controlled or eliminated.;Short Term goal: Identification and review with participant of any Quality of Life or Depression concerns found by scoring the questionnaire.;Long Term goal: The participant improves quality of Life and PHQ9 Scores as seen by post scores and/or verbalization of changes             Quality of Life Scores:   Quality of Life - 04/18/22 1114       Quality of Life   Select Quality of Life      Quality of Life Scores   Health/Function Post 24.8 %    Socioeconomic Post 28.29 %    Psych/Spiritual Post 25.71 %    Family Post 28.8 %    GLOBAL Post 26.29 %            Scores of 19 and below usually indicate a  poorer quality of life in these areas.  A difference of  2-3 points is a clinically meaningful difference.  A difference of 2-3 points in the total score of the Quality of Life Index has been associated with significant improvement in overall quality of life, self-image, physical symptoms, and general health in studies assessing change in quality of life.  PHQ-9: Review Flowsheet       04/18/2022 01/30/2022 11/17/2017  Depression screen PHQ 2/9  Decreased Interest 0 0 0  Down, Depressed, Hopeless 0 0 0  PHQ - 2 Score 0 0 0  Altered sleeping 0 1 -  Tired, decreased energy 1 1 -  Change in appetite 0 0 -  Feeling bad or failure about yourself  0 0 -  Trouble concentrating 0 0 -  Moving slowly or fidgety/restless 0 0 -  Suicidal thoughts 0 0 -  PHQ-9 Score 1 2 -  Difficult doing work/chores Not difficult at all Not difficult at all -   Interpretation of Total Score  Total Score Depression Severity:  1-4 = Minimal depression, 5-9 = Mild depression, 10-14 = Moderate depression, 15-19 = Moderately severe depression, 20-27 = Severe depression   Psychosocial Evaluation and Intervention:  Psychosocial Evaluation - 01/29/22 1029       Psychosocial Evaluation & Interventions   Interventions Encouraged to exercise with the program and follow exercise prescription    Comments PAm has no barriers to attending the program. She is looking forward to returning to an exercise routine. 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  She stated she needs to build herself back up with her exercise. She lives with her husband  and they have a 100 lb Lab. Her husband and children are her support.  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   She does have TYpe 2 Diabetes and at this time is diet controlled. She lost some weight in attempt to help control her Diabetes. She should do well with the program    Expected Outcomes STG Pam attends all  scheduled sessions. she is able to progress with her exercise prescription and get back to more her exercise routine. LTG Pam continues to progress with her exercise routine.    Continue Psychosocial Services  Follow up required by staff             Psychosocial Re-Evaluation:  Psychosocial Re-Evaluation     Row Name 02/18/22 1127 03/11/22 1120 04/08/22 1114         Psychosocial Re-Evaluation   Current issues with Current Stress Concerns Current Stress Concerns Current Stress Concerns     Comments Pam is off to a good start in rehab. She has noted some pain in legs with walking at home and in her shoulder. She does see her doctor later this week and plans to talk to them about it.  She denies any other major stressors currently. She is enjoying getting back to an exercise routine again. She is also wanting to to work on her balance. She is a good sleeper and gets between 8-9 hrs.  She says she needs to be more active after dinner so her food doesn't sit and she moves more versus napping in chair. Pam is doing well in rehab.  She was excited to come off the heart monitor but then started to notice that her heart was pumping harder on her.  She had taking something for allergies to get relief so she thinks they may be linked.  She did talk to the doctor about he legs being numb and it has gotten better for her.  She is still sleeping well.  She does note that when she exercises she feels better. Pam is doing well in rehab.  She has enjoyed program and getting back to routine.  She helps care for her grandkids with getting them to and from school too. She continues to sleep well.     Expected Outcomes Short: Start to move more and talk to doctor about pain Long: conitnue to stay positive Short; Conitnue to manage here heart and exercise for mental boost Long: Conitnue to stay positive Short: Continue to exercise for mental boost Long; Conitnue to stay positive     Interventions Encouraged to attend  Cardiac Rehabilitation for the exercise;Stress management education -- Encouraged to attend Cardiac Rehabilitation for the exercise;Stress management education     Continue Psychosocial Services  Follow up required by staff -- Follow up required by staff              Psychosocial Discharge (Final Psychosocial Re-Evaluation):  Psychosocial Re-Evaluation - 04/08/22 1114       Psychosocial Re-Evaluation   Current issues with Current Stress Concerns    Comments Pam is doing well in rehab.  She has enjoyed program and getting back to routine.  She helps care for her grandkids with getting them to and from school too. She continues to sleep well.    Expected Outcomes Short: Continue to exercise for mental boost Long; Conitnue to stay positive    Interventions Encouraged to attend Cardiac Rehabilitation for the exercise;Stress management education    Continue  Psychosocial Services  Follow up required by staff             Vocational Rehabilitation: Provide vocational rehab assistance to qualifying candidates.   Vocational Rehab Evaluation & Intervention:  Vocational Rehab - 01/29/22 1024       Initial Vocational Rehab Evaluation & Intervention   Assessment shows need for Vocational Rehabilitation No      Vocational Rehab Re-Evaulation   Comments retired             Education: Education Goals: Education classes will be provided on a variety of topics geared toward better understanding of heart health and risk factor modification. Participant will state understanding/return demonstration of topics presented as noted by education test scores.  Learning Barriers/Preferences:   General Cardiac Education Topics:  AED/CPR: - Group verbal and written instruction with the use of models to demonstrate the basic use of the AED with the basic ABC's of resuscitation.   Anatomy and Cardiac Procedures: - Group verbal and visual presentation and models provide information about  basic cardiac anatomy and function. Reviews the testing methods done to diagnose heart disease and the outcomes of the test results. Describes the treatment choices: Medical Management, Angioplasty, or Coronary Bypass Surgery for treating various heart conditions including Myocardial Infarction, Angina, Valve Disease, and Cardiac Arrhythmias.  Written material given at graduation. Flowsheet Row [facility]  Education need identified 01/30/22  Date 02/20/22  Educator SB  Instruction Review Code 1- Verbalizes Understanding       Medication Safety: - Group verbal and visual instruction to review commonly prescribed medications for heart and lung disease. Reviews the medication, class of the drug, and side effects. Includes the steps to properly store meds and maintain the prescription regimen.  Written material given at graduation. Flowsheet [facility]  Date 03/06/22  Educator [facility]  Instruction Review Code 1- Verbalizes Understanding       Intimacy: - Group verbal instruction through game format to discuss how heart and lung disease can affect sexual intimacy. Written material given at graduation.. Flowsheet Row [facility]  Date 04/10/22  [facility]ator [facility]  Instruction Review Code 1- Verbalizes Understanding       Know Your Numbers and Heart Failure: - Group verbal and visual instruction to discuss disease risk factors for cardiac and pulmonary disease and treatment options.  Reviews associated critical values for Overweight/Obesity, Hypertension, Cholesterol, and Diabetes.  Discusses basics of heart failure: signs/symptoms and treatments.  Introduces Heart Failure Zone chart for action plan for heart failure.  Written material given at graduation. Flowsheet Row [facility]  Date  03/13/22  Educator [facility]  Instruction Review Code 1- Verbalizes Understanding       Infection Prevention: - Provides verbal and written material to individual with discussion of infection control including proper hand washing and proper equipment cleaning during exercise session. Flowsheet Row [facility]  Date 01/30/22  Educator NT  Instruction Review Code 1- Verbalizes Understanding       Falls Prevention: - Provides verbal and written material to individual with discussion of falls prevention and safety. Flowsheet [facility]  Date 01/29/22  Educator SB  Instruction Review Code 1- Verbalizes Understanding       Other: -  Provides group and verbal instruction on various topics (see comments) Flowsheet Row Cardiac Rehab from 04/10/2022 in [facility] Cardiac and Pulmonary Rehab  Date 02/27/22  Educator SB  Instruction Review Code 1- Verbalizes Understanding       Knowledge Questionnaire Score:  Knowledge Questionnaire Score - 04/18/22 1114       Knowledge Questionnaire Score   Post Score 24/26             Core Components/Risk Factors/Patient Goals at Admission:  Personal Goals and Risk Factors at Admission - 01/30/22 1058       Core Components/Risk Factors/Patient Goals on Admission    Weight Management Yes    Admit Weight 136 lb 8 oz (61.9 kg)    Goal Weight: Long Term 135 lb (61.2 kg)    Expected Outcomes Short Term: Continue to assess and modify interventions until short term weight is achieved;Long Term: Adherence to nutrition and physical activity/exercise program aimed toward attainment of established weight goal;Weight Maintenance: Understanding of the daily nutrition guidelines, which includes 25-35% calories from fat, 7% or less cal from saturated fats, less than 200mg  cholesterol, less than 1.5gm of sodium, & 5 or more servings of fruits and vegetables  daily    Diabetes Yes    Intervention Provide education about signs/symptoms and action to take for hypo/hyperglycemia.;Provide education about proper nutrition, including hydration, and aerobic/resistive exercise prescription along with prescribed medications to achieve blood glucose in normal ranges: Fasting glucose 65-99 mg/dL    Expected Outcomes Short Term: Participant verbalizes understanding of the signs/symptoms and immediate care of hyper/hypoglycemia, proper foot care and importance of medication, aerobic/resistive exercise and nutrition plan for blood glucose control.;Long Term: Attainment of HbA1C < 7%.    Hypertension Yes    Intervention Provide education on lifestyle modifcations including regular physical activity/exercise, weight management, moderate sodium restriction and increased consumption of fresh fruit, vegetables, and low fat dairy, alcohol moderation, and smoking cessation.;Monitor prescription use compliance.    Expected Outcomes Short Term: Continued assessment and intervention until BP is < 140/21mm HG in hypertensive participants. < 130/18mm HG in hypertensive participants with diabetes, heart failure or chronic kidney disease.;Long Term: Maintenance of blood pressure at goal levels.    Lipids Yes    Intervention Provide education and support for participant on nutrition & aerobic/resistive exercise along with prescribed medications to achieve LDL 70mg , HDL >40mg .    Expected Outcomes Short Term: Participant states understanding of desired cholesterol values and is compliant with medications prescribed. Participant is following exercise prescription and nutrition guidelines.;Long Term: Cholesterol controlled with medications as prescribed, with individualized exercise RX and with personalized nutrition plan. Value goals: LDL < 70mg , HDL > 40 mg.             Education:Diabetes - Individual verbal and written instruction to review signs/symptoms of diabetes, desired  ranges of glucose level fasting, after meals and with exercise. Acknowledge that pre and post exercise glucose checks will be done for 3 sessions at entry of program.   Core Components/Risk Factors/Patient Goals Review:   Goals and Risk Factor Review     Row Name 02/18/22 1136 03/11/22 1123 04/08/22 1118         Core Components/Risk Factors/Patient Goals Review   Personal Goals Review Weight Management/Obesity;Hypertension;Lipids Weight Management/Obesity;Hypertension;Lipids Weight Management/Obesity;Hypertension;Lipids     Review Pam is doing well in rehab so far.  She has been able to maintain her weight since her anemia bout in October.  She is pleased to be able to maintain.  She usually will lose more during summer too when she is outside walking and working more.  She is also going to gym to use weights too to help.  Her blood pressures are doing well for most part recently.  Her numbers were high post surgery but since adding more medicaitons, her pressures are doing better overalll. She has not had any symptoms with the higher reading, but now she is jumpy. She is down to the 140s now.  She checks them 2-3 times a day at home. Pam is doing well in rehab.  She came off heart monitor today, but felt her heart rate was up some after allergy meds.  Her pressures have still been running high.  They have upped her medication to twice a day, but it has not changed her numbers much.  She plans to call her cardiologist again as well.  She has an appointment tomorrow with nephrology to check on her kidney function.  Her weight is holding steady around 134-136 lb. Pam has done well in rehab.  Her weight is steady.  Her pressures have stablizied and have been closer to normal again.  She is feeling better overall.     Expected Outcomes Short: Continue to keep close eye on blood pressures  Long: Conitnue to monitor risk factors. Short: Talk to doctor about pressures Long: Conitnue to monitor risk factors  Continue to monitor risk factors.              Core Components/Risk Factors/Patient Goals at Discharge (Final Review):   Goals and Risk Factor Review - 04/08/22 1118       Core Components/Risk Factors/Patient Goals Review   Personal Goals Review Weight Management/Obesity;Hypertension;Lipids    Review Pam has done well in rehab.  Her weight is steady.  Her pressures have stablizied and have been closer to normal again.  She is feeling better overall.    Expected Outcomes Continue to monitor risk factors.             ITP Comments:  ITP Comments     Row Name 01/29/22 1028 01/30/22 1052 02/05/22 1104 02/13/22 1120 03/05/22 1530   ITP Comments Virtual orientation call completed today. shehas an appointment on Date: 01/30/2022  for EP eval and gym Orientation.  Documentation of diagnosis [facility] in [facility] Date: 01/14/2022 . Completed and gym orientation. Initial ITP created and sent for review to Dr. Bethann Punches, Medical Director. 30 Day review completed. Medical Director ITP review done, changes made as directed, and signed approval by Medical Director.   new to program First full day of exercise!  Patient was oriented to gym and equipment including functions, settings, policies, and procedures.  Patient's individual exercise prescription and treatment plan were reviewed.  All starting workloads were established based on the results of the 6 minute walk test done at initial orientation visit.  The plan for exercise progression was also introduced and progression will be customized based on patient's performance and goals. 30 day review completed. ITP sent to Dr. Bethann Punches, Medical Director of Cardiac Rehab. Continue with ITP unless changes are made by physician.    Row Name 04/02/22 0945 04/22/22 1321         ITP Comments 30 Day review completed. Medical Director ITP review done, changes made as directed, and signed approval by Medical Director. Helma graduated today from  rehab  with 36 sessions completed.  Details of the patient's exercise prescription and what She needs to do in  order to continue the prescription and progress were discussed with patient.  Patient was given a copy of prescription and goals.  Patient verbalized understanding.  Kolette plans to continue to exercise by walking on her own.               Comments: Discharge ITP

## 2022-04-22 NOTE — Progress Notes (Signed)
Daily Session Note  Patient Details  Name: Mandy Delacruz MRN: [id number] Date of Birth: [birth year] Referring Provider:   Flowsheet Row Cardiac Rehab from 01/30/2022 in [facility] Cardiac and Pulmonary Rehab  Referring Provider Thurmon Fair MD       Encounter Date: 04/22/2022  Check In:  Session Check In - 04/22/22 1137       Check-In   Supervising physician immediately available to respond to emergencies See telemetry face sheet for immediately available ER MD    Location [facility]-Cardiac & Pulmonary Rehab    Staff Present Cyndia Diver, RN, BSN, Clyde Canterbury MS, RDN, LDN;Caryle Helgeson Juanetta Gosling, MA, RCEP, CCRP, CCET    Virtual Visit No    Medication changes reported     No    Fall or balance concerns reported    No    Warm-up and Cool-down Performed on first and last piece of equipment    Resistance Training Performed Yes    VAD Patient? No    PAD/SET Patient? No      Pain Assessment   Currently in Pain? No/denies                Social History   Tobacco Use  Smoking Status Former   Years: 46   Types: Cigarettes  Smokeless Tobacco Never  Tobacco Comments   maybe 5-10 cigarettes a day  starts and stops smoking every few days    Goals Met:  Proper associated with RPD/PD & O2 Sat Independence with exercise equipment Exercise tolerated well Personal goals reviewed No report of concerns or symptoms today Strength training completed today  Goals Unmet:  Not Applicable  Comments:  Mandy Delacruz graduated today from  rehab with 36 sessions completed.  Details of the patient's exercise prescription and what She needs to do in order to continue the prescription and progress were discussed with patient.  Patient was given a copy of prescription and goals.  Patient verbalized understanding.  [name] plans to continue to exercise by walking on her own.    Dr. Bethann Punches is Medical Director for [facility] Cardiac Rehabilitation.  Dr. Vida Rigger is Medical Director for  [facility] Pulmonary Rehabilitation.

## 2022-04-29 ENCOUNTER — Other Ambulatory Visit: Payer: Self-pay | Admitting: Cardiology

## 2022-06-02 ENCOUNTER — Emergency Department
Admission: EM | Admit: 2022-06-02 | Discharge: 2022-06-02 | Disposition: A | Discharge status: Home / Self Care | Payer: BC Managed Care – PPO | Attending: Emergency Medicine | Admitting: Emergency Medicine

## 2022-06-02 ENCOUNTER — Other Ambulatory Visit: Payer: Self-pay

## 2022-06-02 DIAGNOSIS — I440 Atrioventricular block, first degree: Secondary | ICD-10-CM | POA: Diagnosis not present

## 2022-06-02 DIAGNOSIS — R55 Syncope and collapse: Secondary | ICD-10-CM | POA: Diagnosis not present

## 2022-06-02 DIAGNOSIS — I4430 Unspecified atrioventricular block: Secondary | ICD-10-CM | POA: Diagnosis not present

## 2022-06-02 DIAGNOSIS — I10 Essential (primary) hypertension: Secondary | ICD-10-CM | POA: Insufficient documentation

## 2022-06-02 DIAGNOSIS — R569 Unspecified convulsions: Secondary | ICD-10-CM | POA: Diagnosis not present

## 2022-06-02 DIAGNOSIS — E119 Type 2 diabetes mellitus without complications: Secondary | ICD-10-CM | POA: Insufficient documentation

## 2022-06-02 DIAGNOSIS — N3000 Acute cystitis without hematuria: Secondary | ICD-10-CM | POA: Insufficient documentation

## 2022-06-02 LAB — CBC
HCT: 31.8 % — ABNORMAL LOW (ref 36.0–46.0)
Hemoglobin: 10.2 g/dL — ABNORMAL LOW (ref 12.0–15.0)
MCH: 31.8 pg (ref 26.0–34.0)
MCHC: 32.1 g/dL (ref 30.0–36.0)
MCV: 99.1 fL (ref 80.0–100.0)
Platelets: 191 10*3/uL (ref 150–400)
RBC: 3.21 MIL/uL — ABNORMAL LOW (ref 3.87–5.11)
RDW: 13.1 % (ref 11.5–15.5)
WBC: 6.3 10*3/uL (ref 4.0–10.5)
nRBC: 0 % (ref 0.0–0.2)

## 2022-06-02 LAB — URINALYSIS, ROUTINE W REFLEX MICROSCOPIC
Bilirubin Urine: NEGATIVE
Glucose, UA: NEGATIVE mg/dL
Ketones, ur: NEGATIVE mg/dL
Nitrite: POSITIVE — AB
Protein, ur: NEGATIVE mg/dL
Specific Gravity, Urine: 1.012 (ref 1.005–1.030)
WBC, UA: >50 WBC/hpf (ref 0–5)
pH: 6 (ref 5.0–8.0)

## 2022-06-02 LAB — BASIC METABOLIC PANEL
Anion gap: 8 (ref 5–15)
BUN: 37 mg/dL — ABNORMAL HIGH (ref 8–23)
CO2: 25 mmol/L (ref 22–32)
Calcium: 9.4 mg/dL (ref 8.9–10.3)
Chloride: 100 mmol/L (ref 98–111)
Creatinine, Ser: 1.36 mg/dL — ABNORMAL HIGH (ref 0.44–1.00)
GFR, Estimated: 41 mL/min — ABNORMAL LOW (ref >60)
Glucose, Bld: 115 mg/dL — ABNORMAL HIGH (ref 70–99)
Potassium: 4 mmol/L (ref 3.5–5.1)
Sodium: 133 mmol/L — ABNORMAL LOW (ref 135–145)

## 2022-06-02 MED ORDER — CEPHALEXIN 500 MG PO CAPS: 500.0000 mg | ORAL_CAPSULE | Freq: Four times a day (QID) | ORAL | 0 refills | Status: AC

## 2022-06-02 NOTE — Discharge Instructions (Signed)
You were seen in the emergency department after passing out (syncope). Please arrange follow-up with a primary care provider in the next few days for further evaluation of your symptoms. Avoid diving, swimming alone, climbing or other activities from a height until cleared by your doctor.  Please make sure you are staying hydrated.  Return to the ER immediately if you develop chest pain, shortness of breath, it feels like your heart is racing, repeated episodes of passing out, or other new or concerning symptoms.   Your test here did show that you likely have a urinary tract infection.  I have sent a prescription for an antibiotic to your pharmacy.  Please take this as directed.

## 2022-06-02 NOTE — ED Provider Notes (Signed)
[facility] Provider Note    Event Date/Time   First MD Initiated Contact with Patient 06/02/22 1553     (approximate)   History   Loss of Consciousness   HPI  Mandy Delacruz is a 77 y.o. female with history of T2DM, HTN, anemia, aortic stenosis s/p TAVR in January presenting to the emergency department following a syncopal episode.  Patient was at a field day for her grandchild outside today.  She had to stand for prolonged period in the sun.  She usually drinks water throughout the day, but was concerned that she would have to go to the bathroom frequently, so she drank less than her usual.  She additionally reports that she gets back pain when standing too long and did have onset of some achiness in her back.  While she was standing outside she began to feel lightheaded.  She went to sit down and ultimately had a witnessed syncopal episode.  No chest pain, shortness of breath.  With EMS, she did have brief bradycardia that self resolved. Denies history of prior syncopal episodes.  No numbness, tingling, focal weakness.  She does report that she has a history of UTIs and has had some dysuria recently.      Physical Exam   Triage Vital Signs: ED Triage Vitals  Enc Vitals Group     BP 06/02/22 1259 (!) 130/58     Pulse Rate 06/02/22 1259 67     Resp 06/02/22 1259 18     Temp 06/02/22 1259 97.9 F (36.6 C)     Temp Source 06/02/22 1259 Oral     SpO2 06/02/22 1259 94 %     Weight 06/02/22 1302 136 lb 7.4 oz (61.9 kg)     Height 06/02/22 1302 5' 4.25" (1.632 m)     Head Circumference --      Peak Flow --      Pain Score 06/02/22 1301 0     Pain Loc --      Pain Edu? --      Excl. in GC? --     Most recent vital signs: Vitals:   06/02/22 1259 06/02/22 1554  BP: (!) 130/58 (!) 128/59  Pulse: 67 68  Resp: 18 16  Temp: 97.9 F (36.6 C)   SpO2: 94% 97%     General: Awake, interactive  CV:  Regular rate, good peripheral perfusion.  Resp:  Lungs  clear, unlabored respirations.  Abd:  Soft, nondistended.  Neuro:  Alert and oriented, normal extraocular movements, symmetric facial movement, sensation intact over bilateral upper and lower extremities with 5 out of 5 strength.   ED Results / Procedures / Treatments   Labs (all labs ordered are listed, but only abnormal results are displayed) Labs Reviewed  BASIC METABOLIC PANEL - Abnormal; Notable for the following components:      Result Value   Sodium 133 (*)    Glucose, Bld 115 (*)    BUN 37 (*)    Creatinine, Ser 1.36 (*)    GFR, Estimated 41 (*)    All other components within normal limits  CBC - Abnormal; Notable for the following components:   RBC 3.21 (*)    Hemoglobin 10.2 (*)    HCT 31.8 (*)    All other components within normal limits  URINALYSIS, ROUTINE W REFLEX MICROSCOPIC - Abnormal; Notable for the following components:   Color, Urine YELLOW (*)    APPearance CLOUDY (*)  Hgb urine dipstick SMALL (*)    Nitrite POSITIVE (*)    Leukocytes,Ua LARGE (*)    Bacteria, UA RARE (*)    All other components within normal limits  CBG MONITORING, ED     EKG EKG independently reviewed interpreted by myself (ER attending) demonstrates:  EKG demonstrates sinus rhythm with first-degree AV block, QRS 76, QTc 407, no acute ST changes  RADIOLOGY Imaging independently reviewed and interpreted by myself demonstrates:    PROCEDURES:  Critical Care performed: No  Procedures   MEDICATIONS ORDERED IN ED: Medications - No data to display   IMPRESSION / MDM / ASSESSMENT AND PLAN / ED COURSE  I reviewed the triage vital signs and the nursing notes.  Differential diagnosis includes, but is not limited to, vasovagal syncope secondary to dehydration, heat, acute pain, arrhythmia, anemia, electrolyte abnormality, much lower suspicion cardiac manage syncope based on history, history of aortic stenosis, but she is status post TAVR earlier this year  Patient's presentation  is most consistent with acute presentation with potential threat to life or bodily function.  77 year old female presenting to the emergency department following a syncopal episode.  She is well-appearing with normal neurologic exam here.  Clinical history seems most consistent with vasovagal syncope with multiple possible triggers including heat, prolonged standing, dehydration.  She does have a cardiac history, but telemetry here has been reassuring and her clinical history is much less suggestive of cardiac syncope.  I did discuss further evaluation including possible troponin, prolonged monitoring, but patient reports she feels much improved and would like to be discharged.  Do think this is reasonable based on her clinical history.  Of note, her urine is concerning for infection and she does report some symptoms.  Most recent urine culture in our system from October of last year which grew E. coli that was sensitive to Keflex.  Will plan to DC with this.  Very strict return precautions for recurrent syncope, chest pain, shortness of breath, fevers, or any other new or concerning symptoms provided.  FINAL CLINICAL IMPRESSION(S) / ED DIAGNOSES   Final diagnoses:  Syncope and collapse  Acute cystitis without hematuria     Rx / DC Orders   ED Discharge Orders          Ordered    cephALEXin (KEFLEX) 500 MG capsule  4 times daily        06/02/22 1624             Note:  This document was prepared using Dragon voice recognition software and may include unintentional dictation errors.   Trinna Post, MD 06/02/22 [phone]

## 2022-06-02 NOTE — ED Triage Notes (Signed)
Pt here via [facility] with syncope from [employer] volunteering with field day. Pt had a seizure as well per staff, positive orthostatics. Pt had aortic valve replacement in Jan, no complications. Pt did lose consciousness with ems and HR went down to the 30s but then resolved. 300cc of fluids given, bilateral 18Gs and 4mg  of zofran. Pt has slight dizziness and weakness.   130/55 98% RA 65 33-co2 18-20

## 2022-06-02 NOTE — ED Provider Triage Note (Signed)
Emergency Medicine Provider Triage Evaluation Note  Mandy Delacruz, a 77 y.o. female  was evaluated in triage.  Pt complains of LOC.  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  She ate as per usual but apparently had a syncopal episode after she sat down secondary to some increasing back pain.  Patient denies any chest pain or shortness of breath.  She presents with ongoing dizziness and generalized weakness.  Review of Systems  Positive: Syncope, weakness Negative: CP, SOB  Physical Exam  BP (!) 130/58   Pulse 67   Temp 97.9 F (36.6 C) (Oral)   Resp 18   Ht 5' 4.25" (1.632 m)   Wt 61.9 kg   SpO2 94%   BMI 23.24 kg/m  Gen:   Awake, no distress  NAD Resp:  Normal effort CTA MSK:   Moves extremities without difficulty  Other:    Medical Decision Making  Medically screening exam initiated at 1:08 PM.  Appropriate orders placed.  Mandy Delacruz was informed that the remainder of the evaluation will be completed by another provider, this initial triage assessment does not replace that evaluation, and the importance of remaining in the ED until their evaluation is complete.  Geriatric patient to the ED for evaluation of a witnessed syncopal episode.   Lissa Hoard, PA-C 06/02/22 1315

## 2022-06-03 DIAGNOSIS — N3000 Acute cystitis without hematuria: Secondary | ICD-10-CM | POA: Diagnosis not present

## 2022-06-04 DIAGNOSIS — R350 Frequency of micturition: Secondary | ICD-10-CM | POA: Diagnosis not present

## 2022-06-04 DIAGNOSIS — N393 Stress incontinence (female) (male): Secondary | ICD-10-CM | POA: Diagnosis not present

## 2022-06-04 DIAGNOSIS — B961 Klebsiella pneumoniae [K. pneumoniae] as the cause of diseases classified elsewhere: Secondary | ICD-10-CM | POA: Diagnosis not present

## 2022-06-04 DIAGNOSIS — R8271 Bacteriuria: Secondary | ICD-10-CM | POA: Diagnosis not present

## 2022-06-04 DIAGNOSIS — R351 Nocturia: Secondary | ICD-10-CM | POA: Diagnosis not present

## 2022-06-04 DIAGNOSIS — N390 Urinary tract infection, site not specified: Secondary | ICD-10-CM | POA: Diagnosis not present

## 2022-08-01 ENCOUNTER — Ambulatory Visit: Payer: BC Managed Care – PPO | Attending: Cardiovascular Disease | Admitting: Cardiovascular Disease

## 2022-08-01 ENCOUNTER — Encounter: Payer: Self-pay | Admitting: Cardiovascular Disease

## 2022-08-01 VITALS — BP 146/62 | HR 64 | Ht 64.0 in | Wt 134.4 lb

## 2022-08-01 DIAGNOSIS — Z952 Presence of prosthetic heart valve: Secondary | ICD-10-CM | POA: Diagnosis not present

## 2022-08-01 DIAGNOSIS — I10 Essential (primary) hypertension: Secondary | ICD-10-CM | POA: Diagnosis not present

## 2022-08-01 DIAGNOSIS — R0989 Other specified symptoms and signs involving the circulatory and respiratory systems: Secondary | ICD-10-CM | POA: Diagnosis not present

## 2022-08-01 DIAGNOSIS — R7989 Other specified abnormal findings of blood chemistry: Secondary | ICD-10-CM

## 2022-08-01 DIAGNOSIS — D500 Iron deficiency anemia secondary to blood loss (chronic): Secondary | ICD-10-CM | POA: Diagnosis not present

## 2022-08-01 DIAGNOSIS — J449 Chronic obstructive pulmonary disease, unspecified: Secondary | ICD-10-CM

## 2022-08-01 NOTE — Progress Notes (Signed)
[facility]   MULTIDISCIPLINARY HEART VALVE CLINIC                                     Cardiology Office Note:    Date:  08/01/2022   ID:  Mandy Delacruz, DOB [birth year], MRN [id number]  PCP:  Irven Coe, MD  [facility] Cardiologist:  Thurmon Fair, MD/ Dr. Lynnette Caffey, MD and Dr. Leafy Ro, MD (TAVR)   [facility] Electrophysiologist:  None   Referring MD: Irven Coe, MD   Chief Complaint  Patient presents with   Cardiac Valve Problem   History of Present Illness:    Mandy Delacruz is a 77 y.o. female with a hx of hiatal hernia, GERD, DMT2, HTN, history of smoking with COPD, AVMS with recent GI bleed, IDA and critical AS s/p TAVR (01/14/22,  23 mm Edwards Sapien 3 U) who presents to clinic for  follow up. No CAD on preTAVR cath. L carotid bruit, without significant stenosis by [place].  She feels well. The patient specifically denies any chest pain at rest exertion, dyspnea at rest or with exertion, orthopnea, paroxysmal nocturnal dyspnea, syncope, palpitations, focal neurological deficits, intermittent claudication, lower extremity edema, unexplained weight gain, cough, hemoptysis or wheezing. No bleeding problems since melena with uncertain source of bleeding in October 2023.   The patient specifically denies any chest pain at rest exertion, dyspnea at rest or with exertion, orthopnea, paroxysmal nocturnal dyspnea, syncope, palpitations, focal neurological deficits, intermittent claudication, lower extremity edema, unexplained weight gain, cough, hemoptysis or wheezing.]  Past Medical History:  Diagnosis Date   Anemia    Asthma    Bronchitis    on bactrin since 12-27-14   Colon polyps 2001   Diverticulosis of colon (without mention of hemorrhage) 2001   Dizziness    inner ear problem at times, saw dr  Consuella Lose griffin for   GERD (gastroesophageal reflux disease)    GI bleed    Hiatal hernia 2001   Hyperlipidemia    Hypertension    IH (inguinal hernia)    left    Lung nodule    Other esophagitis 2001   Severe aortic stenosis    Tobacco abuse     Past Surgical History:  Procedure Laterality Date   breast biospy     left, benign   CHOLECYSTECTOMY     COLONOSCOPY     COLONOSCOPY WITH PROPOFOL N/A 10/26/2021   Procedure: COLONOSCOPY WITH PROPOFOL;  Surgeon: Sherrilyn Rist, MD;  Location: [facility] ENDOSCOPY;  Service: Gastroenterology;  Laterality: N/A;   CORONARY ANGIOGRAPHY N/A 01/14/2022   Procedure: CORONARY ANGIOGRAPHY;  Surgeon: Orbie Pyo, MD;  Location: [facility] INVASIVE CV LAB;  Service: Open Heart Surgery;  Laterality: N/A;   ENDOVENOUS ABLATION SAPHENOUS VEIN W/ LASER Left 04/29/2016   endovenous laser ablation L SSV and stab phlebectomy left leg by Josephina Gip MD    ESOPHAGOGASTRODUODENOSCOPY (EGD) WITH PROPOFOL N/A 10/25/2021   Procedure: ESOPHAGOGASTRODUODENOSCOPY (EGD) WITH PROPOFOL;  Surgeon: Imogene Burn, MD;  Location: [facility] ENDOSCOPY;  Service: Gastroenterology;  Laterality: N/A;   INGUINAL HERNIA REPAIR  oct 2015   left   INGUINAL HERNIA REPAIR Left 01/10/2015   Procedure: OPEN REPAIR OF RECURRENT LEFT INGUINAL HERNIA ;  Surgeon: Gaynelle Adu, MD;  Location: [facility] ORS;  Service: General;  Laterality: Left;   INSERTION OF MESH Left 01/10/2015   Procedure: INSERTION OF MESH;  Surgeon:  Gaynelle Adu, MD;  Location: [facility] ORS;  Service: General;  Laterality: Left;   INSERTION OF MESH N/A 08/18/2017   Procedure: INSERTION OF MESH;  Surgeon: Gaynelle Adu, MD;  Location: [facility] OR;  Service: General;  Laterality: N/A;   INTRAOPERATIVE TRANSTHORACIC ECHOCARDIOGRAM N/A 01/14/2022   Procedure: INTRAOPERATIVE TRANSTHORACIC ECHOCARDIOGRAM;  Surgeon: Orbie Pyo, MD;  Location: [facility] INVASIVE CV LAB;  Service: Open Heart Surgery;  Laterality: N/A;   LAPAROSCOPY N/A 01/10/2015   Procedure: LAPAROSCOPY DIAGNOSTIC;  Surgeon: Gaynelle Adu, MD;  Location: [facility] ORS;  Service: General;  Laterality: N/A;   ORIF HIP FRACTURE  2006   3 pins, right   RIGHT HEART CATH  AND CORONARY ANGIOGRAPHY N/A 12/19/2021   Procedure: RIGHT HEART CATH AND CORONARY ANGIOGRAPHY;  Surgeon: Kathleene Hazel, MD;  Location: [facility] INVASIVE CV LAB;  Service: Cardiovascular;  Laterality: N/A;   TRANSCATHETER AORTIC VALVE REPLACEMENT, TRANSFEMORAL N/A 01/14/2022   Procedure: Transcatheter Aortic Valve Replacement, Transfemoral;  Surgeon: Orbie Pyo, MD;  Location: [facility] INVASIVE CV LAB;  Service: Open Heart Surgery;  Laterality: N/A;   UMBILICAL HERNIA REPAIR N/A 08/18/2017   Procedure: LAPAROSCOPIC-ASSISTED UMBILICAL HERNIA  REPAIR ERAS PATHWAY;  Surgeon: Gaynelle Adu, MD;  Location: [facility] OR;  Service: General;  Laterality: N/A;    Current Medications: Current Meds  Medication Sig   ACCU-CHEK GUIDE test strip 2 (two) times daily. as directed   acetaminophen (TYLENOL) 650 MG CR tablet Take 1,300 mg by mouth every 8 (eight) hours as needed for pain.   albuterol (VENTOLIN HFA) 108 (90 Base) MCG/ACT inhaler Inhale 2 puffs into the lungs every 6 (six) hours as needed for wheezing or shortness of breath.   aspirin 81 MG chewable tablet Chew 1 tablet (81 mg total) by mouth daily. Okay to swallow whole.   diphenhydrAMINE (BENADRYL) 25 MG tablet Take 25 mg by mouth at bedtime.   ferrous sulfate 325 (65 FE) MG tablet Take 1 tablet (325 mg total) by mouth daily.   fexofenadine (ALLEGRA) 180 MG tablet Take 180 mg by mouth daily as needed for allergies or rhinitis.   losartan-hydrochlorothiazide (HYZAAR) 50-12.5 MG tablet Take 2 tablets by mouth daily.   pantoprazole (PROTONIX) 40 MG tablet Take 1 tablet (40 mg total) by mouth daily. (Patient taking differently: Take 40 mg by mouth at bedtime.)   pravastatin (PRAVACHOL) 40 MG tablet TAKE 1 TABLET(40 MG) BY MOUTH EVERY EVENING     Allergies:   Doxycycline hyclate, Ace inhibitors, Hydrocodone bit-homatrop mbr, Metoprolol, and Codeine   Social History   Socioeconomic History   Marital status: Married    Spouse name: Not on file   Number  of children: 2   Years of education: Not on file   Highest education level: Not on file  Occupational History   Occupation: part-time office asst.  Tobacco Use   Smoking status: Former    Types: Cigarettes   Smokeless tobacco: Never   Tobacco comments:    maybe 5-10 cigarettes a day  starts and stops smoking every few days  Vaping Use   Vaping status: Never Used  Substance and Sexual Activity   Alcohol use: Yes    Alcohol/week: 0.0 - 1.0 standard drinks of alcohol    Comment: social   Drug use: No   Sexual activity: Not on file  Other Topics Concern   Not on file  Social History Narrative   Not on file   Social Determinants of Health   Financial Resource Strain: Not on  file  Food Insecurity: No Food Insecurity (01/14/2022)   Hunger Vital Sign    Worried About Running Out of Food in the Last Year: Never true    Ran Out of Food in the Last Year: Never true  Transportation Needs: No Transportation Needs (01/14/2022)   PRAPARE - Administrator, Civil Service (Medical): No    Lack of Transportation (Non-Medical): No  Physical Activity: Not on file  Stress: Not on file  Social Connections: Not on file    Family History: The patient's family history includes COPD in her mother; Celiac disease in an other family member; Hypertension in her father. There is no history of Colon cancer, Esophageal cancer, Rectal cancer, or Stomach cancer.  ROS:   Please see the history of present illness.    All other systems reviewed and are negative.  EKGs/Labs/Other Studies Reviewed:    The following studies were reviewed today:  Echocardiogram 02/24/22:   1. Left ventricular ejection fraction, by estimation, is 55 to 60%. The  left ventricle has normal function. The left ventricle has no regional  wall motion abnormalities. There is moderate concentric left ventricular  hypertrophy. Left ventricular  diastolic parameters are consistent with Grade I diastolic dysfunction   (impaired relaxation).   2. Right ventricular systolic function is normal. The right ventricular  size is normal. Tricuspid regurgitation signal is inadequate for assessing  PA pressure.   3. Left atrial size was moderately dilated.   4. The mitral valve is degenerative. Trivial mitral valve regurgitation.  Mild mitral stenosis. The mean mitral valve gradient is 3.0 mmHg. Moderate  mitral annular calcification.   5. Bioprosthetic aortic valve s/p TAVR with 23 mm Edwards Sapien THV.  Mean gradient 15 mmHg, mildly higher than prior, with EOA 2.5 cm^2. No  significant peri-valvular leakage noted. The aortic valve has been  repaired/replaced.   6. The inferior vena cava is normal in size with greater than 50%  respiratory variability, suggesting right atrial pressure of 3 mmHg.   7. Bradycardia/sinus pauses noted on echo.   ZIO 02/05/22:    The dominant rhythm is sinus rhythm with first-degree AV block.  There is normal circadian variation.   There is no severe bradycardia, but there is a single 3-second pause due to sinus node dysfunction, with junctional escape.  There is no evidence of high-grade atrioventricular block.   Junctional escape beats are also seen following an episode of SVT.   There is a brief episode of supraventricular tachycardia (total of 14 seconds of consecutive brief bursts of paroxysmal atrial tachycardia).   There is a single 5 beat run of nonsustained ventricular tachycardia.   There are rare isolated PACs and PVCs.  There is no evidence of atrial fibrillation.    TAVR OPERATIVE NOTE     Date of Procedure:                01/14/2022   Preoperative Diagnosis:      Severe Aortic Stenosis    Postoperative Diagnosis:    Same    Procedure:        Transcatheter Aortic Valve Replacement - Percutaneous right Transfemoral Approach             Edwards Sapien 3 Ultra THV (size 23 mm, model # 9750RSL, serial # [id number])              Co-Surgeons:  Eugenio Hoes MD and Alverda Skeans, MD     Anesthesiologist:                  Corky Sox MD   Echocardiographer:              Thurmon Fair MD   Pre-operative Echo Findings: Severe aortic stenosis normal left ventricular systolic function   Post-operative Echo Findings: no paravalvular leak normal left ventricular systolic function   _____________     Echo 01/15/22: IMPRESSIONS   1. Left ventricular ejection fraction, by estimation, is >75%. The left  ventricle has hyperdynamic function. The left ventricle has no regional  wall motion abnormalities. Left ventricular diastolic parameters were  normal.   2. Right ventricular systolic function is hyperdynamic. The right  ventricular size is normal. Tricuspid regurgitation signal is inadequate  for assessing PA pressure.   3. Left atrial size was mildly dilated.   4. The mitral valve is degenerative. No evidence of mitral valve  regurgitation. No evidence of mitral stenosis. Moderate to severe mitral  annular calcification.   5. The aortic valve has been repaired/replaced. Aortic valve  regurgitation is not visualized. Echo findings are consistent with normal  structure and function of the aortic valve prosthesis. Aortic valve mean  gradient measures 10.0 mmHg. Aortic valve  Vmax measures 2.20 m/s. Aortic valve acceleration time measures 81 ms   EKG:  EKG is ordered today.  The ekg ordered today demonstrates NSR with 1ste degree AV block.   Recent Labs: 01/10/2022: ALT 28 01/15/2022: Magnesium 1.8 06/02/2022: BUN 37; Creatinine, Ser 1.36; Hemoglobin 10.2; Platelets 191; Potassium 4.0; Sodium 133  Recent Lipid Panel No results found for: "CHOL", "TRIG", "HDL", "CHOLHDL", "VLDL", "LDLCALC", "LDLDIRECT"   Physical Exam:    VS:  BP (!) 146/62 (BP Location: Left Arm, Patient Position: Sitting, Cuff Size: Normal)   Pulse 64   Ht 5\' 4"  (1.626 m)   Wt 134 lb 6.4 oz (61 kg)   SpO2 96%   BMI 23.07 kg/m     Wt Readings from  Last 3 Encounters:  08/01/22 134 lb 6.4 oz (61 kg)  06/02/22 136 lb 7.4 oz (61.9 kg)  04/03/22 136 lb 8 oz (61.9 kg)     General: Alert, oriented x3, no distress, looks comfortable Head: no evidence of trauma, PERRL, EOMI, no exophtalmos or lid lag, no myxedema, no xanthelasma; normal ears, nose and oropharynx Neck: normal jugular venous pulsations and no hepatojugular reflux; brisk carotid pulses with bruit on left Chest: clear to auscultation, no signs of consolidation by percussion or palpation, normal fremitus, symmetrical and full respiratory excursions Cardiovascular: normal position and quality of the apical impulse, regular rhythm, normal first and second heart sounds, no murmurs, rubs or gallops Abdomen: no tenderness or distention, no masses by palpation, no abnormal pulsatility or arterial bruits, normal bowel sounds, no hepatosplenomegaly Extremities: no clubbing, cyanosis or edema; 2+ radial, ulnar and brachial pulses bilaterally; 2+ right femoral, posterior tibial and dorsalis pedis pulses; 2+ left femoral, posterior tibial and dorsalis pedis pulses; no subclavian or femoral bruits Neurological: grossly nonfocal Psych: Normal mood and affect   ASSESSMENT/PLAN:    1. S/P TAVR (transcatheter aortic valve replacement)   2. Iron deficiency anemia due to chronic blood loss   3. Essential hypertension   4. Bruit of left carotid artery   5. Chronic obstructive pulmonary disease, unspecified COPD type (HCC)   6. Abnormal serum creatinine level     S/P TAVR: asymptomatic, aware of  need for endocarditis prophylaxis and echo follow up.  23 mm Edwards S3U , mean gradient 15 mmHg.  Anemia/ GI bleeding: Followed by Dr. Myrtie Neither. On ASA. Last Hgb 10.2. HTN: high today, consistently lower at home. No change to meds today. Keep a one week log and send It by mychart. COPD: Follows closely with pulmonary, PCP.  No longer smoking. Did not do well on Symbicort. Carotid bruit: <40% stenosis  bilaterally by [place]. Abnormal renal function: labs were normal in January. On losartan-hydrochlorothiazide.  Encourage water intake. Monitor  Medication Adjustments/Labs and Tests Ordered: Current medicines are reviewed at length with the patient today.  Concerns regarding medicines are outlined above.  No orders of the defined types were placed in this encounter.  No orders of the defined types were placed in this encounter.   Patient Instructions  Medication Instructions:  No changes *If you need a refill on your cardiac medications before your next appointment, please call your pharmacy*  Follow-Up: At [facility], you and your health needs are our priority.  As part of our continuing mission to provide you with exceptional heart care, we have created designated Provider Care Teams.  These Care Teams include your primary Cardiologist (physician) and Advanced Practice Providers (APPs -  Physician Assistants and Nurse Practitioners) who all work together to provide you with the care you need, when you need it.  We recommend signing up for the patient portal called "MyChart".  Sign up information is provided on this After Visit Summary.  MyChart is used to connect with patients for Virtual Visits (Telemedicine).  Patients are able to view lab/test results, encounter notes, upcoming appointments, etc.  Non-urgent messages can be sent to your provider as well.   To learn more about what you can do with MyChart, go to [email or web address].    Your next appointment:   1 year(s)  Provider:   Thurmon Fair, MD       Signed, Thurmon Fair, MD  08/01/2022 1:03 PM    [facility]

## 2022-08-01 NOTE — Patient Instructions (Addendum)

## 2022-08-25 ENCOUNTER — Other Ambulatory Visit: Payer: Self-pay | Admitting: Family Medicine

## 2022-08-25 DIAGNOSIS — M810 Age-related osteoporosis without current pathological fracture: Secondary | ICD-10-CM | POA: Diagnosis not present

## 2022-08-25 DIAGNOSIS — Z8739 Personal history of other diseases of the musculoskeletal system and connective tissue: Secondary | ICD-10-CM

## 2022-08-25 DIAGNOSIS — Z23 Encounter for immunization: Secondary | ICD-10-CM | POA: Diagnosis not present

## 2022-08-25 DIAGNOSIS — Z1159 Encounter for screening for other viral diseases: Secondary | ICD-10-CM | POA: Diagnosis not present

## 2022-08-25 DIAGNOSIS — I700 Atherosclerosis of aorta: Secondary | ICD-10-CM | POA: Diagnosis not present

## 2022-08-25 DIAGNOSIS — N1831 Chronic kidney disease, stage 3a: Secondary | ICD-10-CM | POA: Diagnosis not present

## 2022-08-25 DIAGNOSIS — Z0000 Encounter for general adult medical examination without abnormal findings: Secondary | ICD-10-CM | POA: Diagnosis not present

## 2022-08-25 DIAGNOSIS — I129 Hypertensive chronic kidney disease with stage 1 through stage 4 chronic kidney disease, or unspecified chronic kidney disease: Secondary | ICD-10-CM | POA: Diagnosis not present

## 2022-08-25 DIAGNOSIS — E1169 Type 2 diabetes mellitus with other specified complication: Secondary | ICD-10-CM | POA: Diagnosis not present

## 2022-11-21 DIAGNOSIS — E119 Type 2 diabetes mellitus without complications: Secondary | ICD-10-CM | POA: Diagnosis not present

## 2022-11-21 DIAGNOSIS — H5213 Myopia, bilateral: Secondary | ICD-10-CM | POA: Diagnosis not present

## 2022-11-21 DIAGNOSIS — Z961 Presence of intraocular lens: Secondary | ICD-10-CM | POA: Diagnosis not present

## 2022-11-21 DIAGNOSIS — H52203 Unspecified astigmatism, bilateral: Secondary | ICD-10-CM | POA: Diagnosis not present

## 2022-12-03 DIAGNOSIS — R8271 Bacteriuria: Secondary | ICD-10-CM | POA: Diagnosis not present

## 2022-12-03 DIAGNOSIS — R350 Frequency of micturition: Secondary | ICD-10-CM | POA: Diagnosis not present

## 2022-12-16 DIAGNOSIS — J189 Pneumonia, unspecified organism: Secondary | ICD-10-CM | POA: Diagnosis not present

## 2022-12-16 DIAGNOSIS — J441 Chronic obstructive pulmonary disease with (acute) exacerbation: Secondary | ICD-10-CM | POA: Diagnosis not present

## 2023-01-13 DIAGNOSIS — S76011A Strain of muscle, fascia and tendon of right hip, initial encounter: Secondary | ICD-10-CM | POA: Diagnosis not present

## 2023-01-20 NOTE — Progress Notes (Signed)
   [facility]   MULTIDISCIPLINARY HEART VALVE TEAM  Structural Heart Office Note:  .    Date:  01/21/2023  ID:  DENISSA Delacruz, DOB Aug 27, 1945, MRN [id number] PCP: Leonel Cole, MD  [facility] Providers Cardiologist:  Jerel Balding, MD  History of Present Illness: .   Mandy Delacruz is a 78 y.o. female with a hx of hiatal hernia, GERD, DMT2, HTN, history of smoking with COPD, AVMS with recent GI bleed, IDA and critical AS s/p TAVR (01/14/22) who presents to clinic for one year follow up.    Mandy Delacruz had an echo 12/12/21 which showed EF 65%, severe LVH of the basal-septal segment, and critical AS with mean grad 67 mmHg, peak grad 113 mmHg, AVA 0.58 cm2, DVI 0.23, as well as severe MAC. She was referred for structural heart consultation. [facility] 12/19/21 showed no angiographic evidence of CAD as well as normal right and left heart pressures. She was then evaluated by the multidisciplinary valve team and underwent successful TAVR with a 23 mm Edwards Sapien 3 Ultra Resilia THV via the TF approach on 01/14/22. Post operative echo showed EF >75%, normally functioning TAVR with a mean gradient of 10 mmHg and no PVL as well as moderate to severe MAC.  She was started on ASA 81mg  daily. She has done well since that time and was last seen by Dr. Balding 07/2022 and was noted to be doing well.   Today she is here alone and reports she has been well with no recent hospitalizations although recently has been dealing with bronchitis and PNA for which she was followed by her PCP. She denies chest pain, SOB, palpitations, LE edema, orthopnea, PND, dizziness, or syncope. Denies bleeding in stool or urine.   Physical Exam:   VS:  Pulse 72   Ht 5' 5 (1.651 m)   Wt 135 lb (61.2 kg)   SpO2 94%   BMI 22.47 kg/m    Wt Readings from Last 3 Encounters:  01/21/23 135 lb (61.2 kg)  08/01/22 134 lb 6.4 oz (61 kg)  06/02/22 136 lb 7.4 oz (61.9 kg)    General: Well developed, well nourished,  NAD Lungs:Clear to ausculation of the R with expiratory rhonchi of the LLL. Breathing is unlabored. Cardiovascular: RRR with S1 S2. No murmurs Extremities: No edema.  Neuro: Alert and oriented. No focal deficits. No facial asymmetry. MAE spontaneously. Psych: Responds to questions appropriately with normal affect.    ASSESSMENT AND PLAN: .    Severe AS s/p TAVR: Patient doing well with NYHA class I symptoms s/p TAVR. Echo today with normal LVEF at 65-70% with trivial MR and severe MAC with stable 23mm S3UR TAVR valve function with a mean gradient at , peak 13.101mmHg, and DI of 0.72 with no PVL. Continue ASA 81mg  monotherapy and dental SBE. Plan regular follow up with Dr. Tyrone team.     IDA with history of GI bleeding: Followed closely by Dr. Legrand. Tolerating ASA without issue. Hb appears to be stable on trend review.    HTN: Stable today with no changes needed at this time. Continue Hyzaar 100-25mg  daily.    COPD: Follows closely with pulmonary, PCP.   I spent 25 minutes caring for this patient today including face-to-face discussions, ordering and reviewing labs, reviewing records from [facility] and other outside facilities, documenting in the record, and arranging for follow up.    Signed, Kate Minus, NP

## 2023-01-21 ENCOUNTER — Other Ambulatory Visit (HOSPITAL_COMMUNITY): Payer: Self-pay | Admitting: Cardiovascular Disease

## 2023-01-21 ENCOUNTER — Ambulatory Visit: Payer: BC Managed Care – PPO | Attending: Cardiology

## 2023-01-21 ENCOUNTER — Ambulatory Visit: Payer: BC Managed Care – PPO

## 2023-01-21 ENCOUNTER — Ambulatory Visit: Payer: BC Managed Care – PPO | Admitting: Cardiology

## 2023-01-21 VITALS — BP 130/74 | HR 72 | Ht 65.0 in | Wt 135.0 lb

## 2023-01-21 DIAGNOSIS — D500 Iron deficiency anemia secondary to blood loss (chronic): Secondary | ICD-10-CM | POA: Insufficient documentation

## 2023-01-21 DIAGNOSIS — J449 Chronic obstructive pulmonary disease, unspecified: Secondary | ICD-10-CM | POA: Insufficient documentation

## 2023-01-21 DIAGNOSIS — Z952 Presence of prosthetic heart valve: Secondary | ICD-10-CM

## 2023-01-21 DIAGNOSIS — I10 Essential (primary) hypertension: Secondary | ICD-10-CM | POA: Insufficient documentation

## 2023-01-21 LAB — ECHOCARDIOGRAM COMPLETE
AV Mean grad: 8 mm[Hg]
AV Peak grad: 13.7 mm[Hg]
Ao pk vel: 1.85 m/s
Area-P 1/2: 1.77 cm2
S' Lateral: 2.4 cm

## 2023-01-21 NOTE — Patient Instructions (Signed)
Medication Instructions:  Your physician recommends that you continue on your current medications as directed. Please refer to the Current Medication list given to you today.  *If you need a refill on your cardiac medications before your next appointment, please call your pharmacy*   Lab Work: None ordered  If you have labs (blood work) drawn today and your tests are completely normal, you will receive your results only by: MyChart Message (if you have MyChart) OR A paper copy in the mail If you have any lab test that is abnormal or we need to change your treatment, we will call you to review the results.   Testing/Procedures: None ordered   Follow-Up: At [facility], you and your health needs are our priority.  As part of our continuing mission to provide you with exceptional heart care, we have created designated Provider Care Teams.  These Care Teams include your primary Cardiologist (physician) and Advanced Practice Providers (APPs -  Physician Assistants and Nurse Practitioners) who all work together to provide you with the care you need, when you need it.  We recommend signing up for the patient portal called "MyChart".  Sign up information is provided on this After Visit Summary.  MyChart is used to connect with patients for Virtual Visits (Telemedicine).  Patients are able to view lab/test results, encounter notes, upcoming appointments, etc.  Non-urgent messages can be sent to your provider as well.   To learn more about what you can do with MyChart, go to [email or web address].    Your next appointment:   6 month(s)  Provider:   Thurmon Fair, MD     Other Instructions

## 2023-02-23 DIAGNOSIS — E1169 Type 2 diabetes mellitus with other specified complication: Secondary | ICD-10-CM | POA: Diagnosis not present

## 2023-02-23 DIAGNOSIS — K219 Gastro-esophageal reflux disease without esophagitis: Secondary | ICD-10-CM | POA: Diagnosis not present

## 2023-02-23 DIAGNOSIS — M810 Age-related osteoporosis without current pathological fracture: Secondary | ICD-10-CM | POA: Diagnosis not present

## 2023-02-23 DIAGNOSIS — N1831 Chronic kidney disease, stage 3a: Secondary | ICD-10-CM | POA: Diagnosis not present

## 2023-02-23 DIAGNOSIS — E7800 Pure hypercholesterolemia, unspecified: Secondary | ICD-10-CM | POA: Diagnosis not present

## 2023-02-24 ENCOUNTER — Ambulatory Visit
Admission: RE | Admit: 2023-02-24 | Discharge: 2023-02-24 | Disposition: A | Discharge status: Home / Self Care | Payer: BC Managed Care – PPO | Source: Ambulatory Visit | Attending: Family Medicine | Admitting: Family Medicine

## 2023-02-24 DIAGNOSIS — K219 Gastro-esophageal reflux disease without esophagitis: Secondary | ICD-10-CM | POA: Diagnosis not present

## 2023-02-24 DIAGNOSIS — M810 Age-related osteoporosis without current pathological fracture: Secondary | ICD-10-CM | POA: Diagnosis not present

## 2023-02-24 DIAGNOSIS — Z8739 Personal history of other diseases of the musculoskeletal system and connective tissue: Secondary | ICD-10-CM

## 2023-02-24 DIAGNOSIS — N958 Other specified menopausal and perimenopausal disorders: Secondary | ICD-10-CM | POA: Diagnosis not present

## 2023-02-24 DIAGNOSIS — E7800 Pure hypercholesterolemia, unspecified: Secondary | ICD-10-CM | POA: Diagnosis not present

## 2023-02-24 DIAGNOSIS — E2839 Other primary ovarian failure: Secondary | ICD-10-CM | POA: Diagnosis not present

## 2023-02-24 DIAGNOSIS — J449 Chronic obstructive pulmonary disease, unspecified: Secondary | ICD-10-CM | POA: Diagnosis not present

## 2023-02-24 DIAGNOSIS — M8588 Other specified disorders of bone density and structure, other site: Secondary | ICD-10-CM | POA: Diagnosis not present

## 2023-03-17 DIAGNOSIS — L570 Actinic keratosis: Secondary | ICD-10-CM | POA: Diagnosis not present

## 2023-03-17 DIAGNOSIS — D492 Neoplasm of unspecified behavior of bone, soft tissue, and skin: Secondary | ICD-10-CM | POA: Diagnosis not present

## 2023-03-17 DIAGNOSIS — L820 Inflamed seborrheic keratosis: Secondary | ICD-10-CM | POA: Diagnosis not present

## 2023-03-17 DIAGNOSIS — L905 Scar conditions and fibrosis of skin: Secondary | ICD-10-CM | POA: Diagnosis not present

## 2023-03-17 DIAGNOSIS — C44722 Squamous cell carcinoma of skin of right lower limb, including hip: Secondary | ICD-10-CM | POA: Diagnosis not present

## 2023-03-17 DIAGNOSIS — L814 Other melanin hyperpigmentation: Secondary | ICD-10-CM | POA: Diagnosis not present

## 2023-03-30 DIAGNOSIS — C4492 Squamous cell carcinoma of skin, unspecified: Secondary | ICD-10-CM | POA: Diagnosis not present

## 2023-03-30 DIAGNOSIS — D225 Melanocytic nevi of trunk: Secondary | ICD-10-CM | POA: Diagnosis not present

## 2023-03-30 DIAGNOSIS — C44612 Basal cell carcinoma of skin of right upper limb, including shoulder: Secondary | ICD-10-CM | POA: Diagnosis not present

## 2023-03-30 DIAGNOSIS — D485 Neoplasm of uncertain behavior of skin: Secondary | ICD-10-CM | POA: Diagnosis not present

## 2023-03-30 DIAGNOSIS — L570 Actinic keratosis: Secondary | ICD-10-CM | POA: Diagnosis not present

## 2023-03-30 DIAGNOSIS — L821 Other seborrheic keratosis: Secondary | ICD-10-CM | POA: Diagnosis not present

## 2023-03-30 DIAGNOSIS — D045 Carcinoma in situ of skin of trunk: Secondary | ICD-10-CM | POA: Diagnosis not present

## 2023-03-30 DIAGNOSIS — L814 Other melanin hyperpigmentation: Secondary | ICD-10-CM | POA: Diagnosis not present

## 2023-04-07 DIAGNOSIS — D045 Carcinoma in situ of skin of trunk: Secondary | ICD-10-CM | POA: Diagnosis not present

## 2023-04-14 DIAGNOSIS — C44722 Squamous cell carcinoma of skin of right lower limb, including hip: Secondary | ICD-10-CM | POA: Diagnosis not present

## 2023-04-27 DIAGNOSIS — Z48817 Encounter for surgical aftercare following surgery on the skin and subcutaneous tissue: Secondary | ICD-10-CM | POA: Diagnosis not present

## 2023-04-28 DIAGNOSIS — E7800 Pure hypercholesterolemia, unspecified: Secondary | ICD-10-CM | POA: Diagnosis not present

## 2023-05-05 DIAGNOSIS — C44612 Basal cell carcinoma of skin of right upper limb, including shoulder: Secondary | ICD-10-CM | POA: Diagnosis not present

## 2023-05-07 ENCOUNTER — Other Ambulatory Visit: Payer: Self-pay | Admitting: Cardiology

## 2023-06-03 DIAGNOSIS — Z48817 Encounter for surgical aftercare following surgery on the skin and subcutaneous tissue: Secondary | ICD-10-CM | POA: Diagnosis not present

## 2023-06-26 ENCOUNTER — Encounter: Payer: Self-pay | Admitting: Cardiovascular Disease

## 2023-08-10 DIAGNOSIS — S90821A Blister (nonthermal), right foot, initial encounter: Secondary | ICD-10-CM | POA: Diagnosis not present

## 2023-08-24 DIAGNOSIS — E7800 Pure hypercholesterolemia, unspecified: Secondary | ICD-10-CM | POA: Diagnosis not present

## 2023-08-24 DIAGNOSIS — N1831 Chronic kidney disease, stage 3a: Secondary | ICD-10-CM | POA: Diagnosis not present

## 2023-08-24 DIAGNOSIS — N183 Chronic kidney disease, stage 3 unspecified: Secondary | ICD-10-CM | POA: Diagnosis not present

## 2023-08-24 DIAGNOSIS — K219 Gastro-esophageal reflux disease without esophagitis: Secondary | ICD-10-CM | POA: Diagnosis not present

## 2023-08-24 DIAGNOSIS — D649 Anemia, unspecified: Secondary | ICD-10-CM | POA: Diagnosis not present

## 2023-08-24 DIAGNOSIS — E1169 Type 2 diabetes mellitus with other specified complication: Secondary | ICD-10-CM | POA: Diagnosis not present

## 2023-08-24 DIAGNOSIS — M810 Age-related osteoporosis without current pathological fracture: Secondary | ICD-10-CM | POA: Diagnosis not present

## 2023-08-26 DIAGNOSIS — E1169 Type 2 diabetes mellitus with other specified complication: Secondary | ICD-10-CM | POA: Diagnosis not present

## 2023-08-26 DIAGNOSIS — D649 Anemia, unspecified: Secondary | ICD-10-CM | POA: Diagnosis not present

## 2023-08-26 DIAGNOSIS — K219 Gastro-esophageal reflux disease without esophagitis: Secondary | ICD-10-CM | POA: Diagnosis not present

## 2023-08-26 DIAGNOSIS — E7800 Pure hypercholesterolemia, unspecified: Secondary | ICD-10-CM | POA: Diagnosis not present

## 2023-09-30 DIAGNOSIS — E7800 Pure hypercholesterolemia, unspecified: Secondary | ICD-10-CM | POA: Diagnosis not present

## 2023-11-17 ENCOUNTER — Other Ambulatory Visit: Payer: Self-pay | Admitting: Family Medicine

## 2023-11-17 DIAGNOSIS — R911 Solitary pulmonary nodule: Secondary | ICD-10-CM

## 2023-11-25 DIAGNOSIS — E119 Type 2 diabetes mellitus without complications: Secondary | ICD-10-CM | POA: Diagnosis not present

## 2023-11-25 DIAGNOSIS — Z961 Presence of intraocular lens: Secondary | ICD-10-CM | POA: Diagnosis not present

## 2023-11-25 DIAGNOSIS — H52203 Unspecified astigmatism, bilateral: Secondary | ICD-10-CM | POA: Diagnosis not present
